# Patient Record
Sex: Male | Born: 1940
Health system: Southern US, Community
[De-identification: ages and names within clinical notes are randomized; demographics above are authoritative.]

## PROBLEM LIST (undated history)

## (undated) DIAGNOSIS — I779 Disorder of arteries and arterioles, unspecified: Secondary | ICD-10-CM

## (undated) DIAGNOSIS — E785 Hyperlipidemia, unspecified: Secondary | ICD-10-CM

## (undated) DIAGNOSIS — K219 Gastro-esophageal reflux disease without esophagitis: Secondary | ICD-10-CM

## (undated) DIAGNOSIS — H109 Unspecified conjunctivitis: Secondary | ICD-10-CM

## (undated) DIAGNOSIS — Z9889 Other specified postprocedural states: Secondary | ICD-10-CM

## (undated) DIAGNOSIS — I4892 Unspecified atrial flutter: Secondary | ICD-10-CM

## (undated) DIAGNOSIS — F419 Anxiety disorder, unspecified: Secondary | ICD-10-CM

## (undated) DIAGNOSIS — G4730 Sleep apnea, unspecified: Secondary | ICD-10-CM

## (undated) DIAGNOSIS — F329 Major depressive disorder, single episode, unspecified: Secondary | ICD-10-CM

## (undated) DIAGNOSIS — M1710 Unilateral primary osteoarthritis, unspecified knee: Secondary | ICD-10-CM

## (undated) DIAGNOSIS — F988 Other specified behavioral and emotional disorders with onset usually occurring in childhood and adolescence: Secondary | ICD-10-CM

## (undated) DIAGNOSIS — H269 Unspecified cataract: Secondary | ICD-10-CM

## (undated) DIAGNOSIS — R011 Cardiac murmur, unspecified: Secondary | ICD-10-CM

## (undated) DIAGNOSIS — J45909 Unspecified asthma, uncomplicated: Secondary | ICD-10-CM

## (undated) DIAGNOSIS — F32A Depression, unspecified: Secondary | ICD-10-CM

## (undated) DIAGNOSIS — I499 Cardiac arrhythmia, unspecified: Secondary | ICD-10-CM

## (undated) DIAGNOSIS — M179 Osteoarthritis of knee, unspecified: Secondary | ICD-10-CM

## (undated) HISTORY — DX: Sleep apnea, unspecified: G47.30

## (undated) HISTORY — DX: Disorder of arteries and arterioles, unspecified: I77.9

## (undated) HISTORY — PX: COLONOSCOPY: SHX174

## (undated) HISTORY — PX: TONSILLECTOMY: SUR1361

## (undated) HISTORY — DX: Unilateral primary osteoarthritis, unspecified knee: M17.10

## (undated) HISTORY — DX: Cardiac murmur, unspecified: R01.1

## (undated) HISTORY — DX: Hyperlipidemia, unspecified: E78.5

## (undated) HISTORY — DX: Unspecified atrial flutter: I48.92

## (undated) HISTORY — DX: Unspecified cataract: H26.9

## (undated) HISTORY — PX: MASTOIDECTOMY: SHX711

## (undated) HISTORY — DX: Gastro-esophageal reflux disease without esophagitis: K21.9

## (undated) HISTORY — DX: Depression, unspecified: F32.A

## (undated) HISTORY — DX: Unspecified conjunctivitis: H10.9

## (undated) HISTORY — DX: Osteoarthritis of knee, unspecified: M17.9

## (undated) HISTORY — DX: Other specified postprocedural states: Z98.890

## (undated) HISTORY — DX: Anxiety disorder, unspecified: F41.9

## (undated) HISTORY — DX: Other specified behavioral and emotional disorders with onset usually occurring in childhood and adolescence: F98.8

## (undated) HISTORY — PX: POLYPECTOMY: SHX149

## (undated) HISTORY — DX: Unspecified asthma, uncomplicated: J45.909

## (undated) HISTORY — DX: Major depressive disorder, single episode, unspecified: F32.9

---

## 2000-11-03 ENCOUNTER — Ambulatory Visit (HOSPITAL_BASED_OUTPATIENT_CLINIC_OR_DEPARTMENT_OTHER): Admission: RE | Admit: 2000-11-03 | Discharge: 2000-11-03 | Payer: Self-pay | Admitting: Psychiatry

## 2002-10-08 ENCOUNTER — Ambulatory Visit (HOSPITAL_BASED_OUTPATIENT_CLINIC_OR_DEPARTMENT_OTHER): Admission: RE | Admit: 2002-10-08 | Discharge: 2002-10-08 | Payer: Self-pay | Admitting: Internal Medicine

## 2003-03-09 ENCOUNTER — Encounter: Payer: Self-pay | Admitting: Orthopedic Surgery

## 2003-03-09 ENCOUNTER — Inpatient Hospital Stay (HOSPITAL_COMMUNITY): Admission: RE | Admit: 2003-03-09 | Discharge: 2003-03-10 | Payer: Self-pay | Admitting: Orthopedic Surgery

## 2004-03-12 ENCOUNTER — Ambulatory Visit: Payer: Self-pay | Admitting: Internal Medicine

## 2005-02-15 HISTORY — PX: WRIST FRACTURE SURGERY: SHX121

## 2005-07-30 ENCOUNTER — Ambulatory Visit: Payer: Self-pay | Admitting: Internal Medicine

## 2005-09-10 ENCOUNTER — Ambulatory Visit: Payer: Self-pay | Admitting: Internal Medicine

## 2006-03-30 ENCOUNTER — Ambulatory Visit: Payer: Self-pay | Admitting: Internal Medicine

## 2007-03-22 DIAGNOSIS — E785 Hyperlipidemia, unspecified: Secondary | ICD-10-CM

## 2007-03-22 HISTORY — DX: Hyperlipidemia, unspecified: E78.5

## 2007-03-23 ENCOUNTER — Ambulatory Visit: Payer: Self-pay | Admitting: Internal Medicine

## 2007-03-23 DIAGNOSIS — R5383 Other fatigue: Secondary | ICD-10-CM

## 2007-03-23 DIAGNOSIS — N400 Enlarged prostate without lower urinary tract symptoms: Secondary | ICD-10-CM | POA: Insufficient documentation

## 2007-03-23 DIAGNOSIS — R05 Cough: Secondary | ICD-10-CM

## 2007-03-23 DIAGNOSIS — N401 Enlarged prostate with lower urinary tract symptoms: Secondary | ICD-10-CM | POA: Insufficient documentation

## 2007-03-23 DIAGNOSIS — N138 Other obstructive and reflux uropathy: Secondary | ICD-10-CM | POA: Insufficient documentation

## 2007-03-23 DIAGNOSIS — R059 Cough, unspecified: Secondary | ICD-10-CM | POA: Insufficient documentation

## 2007-03-23 DIAGNOSIS — R5381 Other malaise: Secondary | ICD-10-CM

## 2007-03-23 HISTORY — DX: Other fatigue: R53.83

## 2007-03-23 HISTORY — DX: Other malaise: R53.81

## 2007-03-23 LAB — CONVERTED CEMR LAB
ALT: 21 units/L (ref 0–53)
Albumin: 4.1 g/dL (ref 3.5–5.2)
Alkaline Phosphatase: 66 units/L (ref 39–117)
BUN: 13 mg/dL (ref 6–23)
Bacteria, UA: NEGATIVE
Basophils Relative: 0.5 % (ref 0.0–1.0)
Bilirubin Urine: NEGATIVE
CO2: 31 meq/L (ref 19–32)
Calcium: 9.5 mg/dL (ref 8.4–10.5)
Crystals: NEGATIVE
GFR calc Af Amer: 78 mL/min
Ketones, ur: NEGATIVE mg/dL
Monocytes Relative: 10.7 % (ref 3.0–11.0)
Platelets: 329 10*3/uL (ref 150–400)
RDW: 12.4 % (ref 11.5–14.6)
Specific Gravity, Urine: 1.025 (ref 1.000–1.03)
Total Protein, Urine: NEGATIVE mg/dL
Total Protein: 7 g/dL (ref 6.0–8.3)
Urine Glucose: NEGATIVE mg/dL
pH: 6 (ref 5.0–8.0)

## 2007-06-12 ENCOUNTER — Ambulatory Visit: Payer: Self-pay | Admitting: Pulmonary Disease

## 2007-06-12 ENCOUNTER — Telehealth (INDEPENDENT_AMBULATORY_CARE_PROVIDER_SITE_OTHER): Payer: Self-pay | Admitting: *Deleted

## 2007-06-12 DIAGNOSIS — J309 Allergic rhinitis, unspecified: Secondary | ICD-10-CM | POA: Insufficient documentation

## 2008-01-08 ENCOUNTER — Telehealth (INDEPENDENT_AMBULATORY_CARE_PROVIDER_SITE_OTHER): Payer: Self-pay | Admitting: *Deleted

## 2008-01-24 ENCOUNTER — Encounter: Payer: Self-pay | Admitting: Adult Health

## 2008-01-24 ENCOUNTER — Ambulatory Visit: Payer: Self-pay | Admitting: Internal Medicine

## 2008-01-24 DIAGNOSIS — R42 Dizziness and giddiness: Secondary | ICD-10-CM

## 2008-01-24 HISTORY — DX: Dizziness and giddiness: R42

## 2008-01-30 LAB — CONVERTED CEMR LAB
ALT: 27 units/L (ref 0–53)
AST: 28 units/L (ref 0–37)
BUN: 13 mg/dL (ref 6–23)
Basophils Relative: 0.3 % (ref 0.0–3.0)
CO2: 32 meq/L (ref 19–32)
Calcium: 9.7 mg/dL (ref 8.4–10.5)
Chloride: 106 meq/L (ref 96–112)
Creatinine, Ser: 1.1 mg/dL (ref 0.4–1.5)
Eosinophils Absolute: 0.2 10*3/uL (ref 0.0–0.7)
Eosinophils Relative: 3.8 % (ref 0.0–5.0)
Glucose, Bld: 105 mg/dL — ABNORMAL HIGH (ref 70–99)
HCT: 37.5 % — ABNORMAL LOW (ref 39.0–52.0)
Hemoglobin: 12.5 g/dL — ABNORMAL LOW (ref 13.0–17.0)
MCV: 93.2 fL (ref 78.0–100.0)
Monocytes Absolute: 0.4 10*3/uL (ref 0.1–1.0)
Monocytes Relative: 9.3 % (ref 3.0–12.0)
Neutro Abs: 2.5 10*3/uL (ref 1.4–7.7)
Platelets: 305 10*3/uL (ref 150–400)
RBC: 4.03 M/uL — ABNORMAL LOW (ref 4.22–5.81)
Sed Rate: 20 mm/hr — ABNORMAL HIGH (ref 0–16)
TSH: 1.8 microintl units/mL (ref 0.35–5.50)
Total Protein: 7 g/dL (ref 6.0–8.3)
WBC: 4.6 10*3/uL (ref 4.5–10.5)

## 2008-01-31 ENCOUNTER — Telehealth (INDEPENDENT_AMBULATORY_CARE_PROVIDER_SITE_OTHER): Payer: Self-pay | Admitting: *Deleted

## 2008-01-31 DIAGNOSIS — H9190 Unspecified hearing loss, unspecified ear: Secondary | ICD-10-CM | POA: Insufficient documentation

## 2008-02-06 ENCOUNTER — Telehealth: Payer: Self-pay | Admitting: Adult Health

## 2008-02-07 ENCOUNTER — Ambulatory Visit: Payer: Self-pay | Admitting: Internal Medicine

## 2008-02-07 DIAGNOSIS — E559 Vitamin D deficiency, unspecified: Secondary | ICD-10-CM | POA: Insufficient documentation

## 2008-02-28 ENCOUNTER — Encounter: Payer: Self-pay | Admitting: Internal Medicine

## 2008-04-15 ENCOUNTER — Ambulatory Visit: Payer: Self-pay | Admitting: Internal Medicine

## 2008-04-15 ENCOUNTER — Telehealth (INDEPENDENT_AMBULATORY_CARE_PROVIDER_SITE_OTHER): Payer: Self-pay | Admitting: *Deleted

## 2008-04-15 LAB — CONVERTED CEMR LAB
ALT: 19 units/L (ref 0–53)
AST: 20 units/L (ref 0–37)
Alkaline Phosphatase: 77 units/L (ref 39–117)
Basophils Absolute: 0 10*3/uL (ref 0.0–0.1)
Basophils Relative: 0.6 % (ref 0.0–3.0)
Bilirubin, Direct: 0.1 mg/dL (ref 0.0–0.3)
CO2: 31 meq/L (ref 19–32)
Calcium: 9 mg/dL (ref 8.4–10.5)
Chloride: 107 meq/L (ref 96–112)
Cholesterol: 137 mg/dL (ref 0–200)
Glucose, Bld: 106 mg/dL — ABNORMAL HIGH (ref 70–99)
Hemoglobin: 12 g/dL — ABNORMAL LOW (ref 13.0–17.0)
LDL Cholesterol: 73 mg/dL (ref 0–99)
Lymphocytes Relative: 13.8 % (ref 12.0–46.0)
MCHC: 34 g/dL (ref 30.0–36.0)
Monocytes Relative: 8.3 % (ref 3.0–12.0)
Neutro Abs: 5.9 10*3/uL (ref 1.4–7.7)
Neutrophils Relative %: 75.8 % (ref 43.0–77.0)
RBC: 3.76 M/uL — ABNORMAL LOW (ref 4.22–5.81)
RDW: 11.6 % (ref 11.5–14.6)
Sodium: 144 meq/L (ref 135–145)
Total Bilirubin: 0.7 mg/dL (ref 0.3–1.2)
Total CHOL/HDL Ratio: 3.6
Total Protein: 7.1 g/dL (ref 6.0–8.3)
VLDL: 26 mg/dL (ref 0–40)

## 2009-06-10 ENCOUNTER — Telehealth (INDEPENDENT_AMBULATORY_CARE_PROVIDER_SITE_OTHER): Payer: Self-pay | Admitting: *Deleted

## 2009-06-25 ENCOUNTER — Ambulatory Visit: Payer: Self-pay | Admitting: Internal Medicine

## 2009-07-08 ENCOUNTER — Encounter: Payer: Self-pay | Admitting: Internal Medicine

## 2009-08-06 ENCOUNTER — Telehealth: Payer: Self-pay | Admitting: Internal Medicine

## 2009-09-15 ENCOUNTER — Ambulatory Visit: Payer: Self-pay | Admitting: Internal Medicine

## 2009-09-15 DIAGNOSIS — E291 Testicular hypofunction: Secondary | ICD-10-CM

## 2009-09-15 HISTORY — DX: Testicular hypofunction: E29.1

## 2009-09-15 LAB — CONVERTED CEMR LAB
ALT: 17 units/L (ref 0–53)
AST: 20 units/L (ref 0–37)
Albumin: 4.4 g/dL (ref 3.5–5.2)
Alkaline Phosphatase: 71 units/L (ref 39–117)
Basophils Absolute: 0 10*3/uL (ref 0.0–0.1)
Bilirubin Urine: NEGATIVE
Calcium: 9.2 mg/dL (ref 8.4–10.5)
Eosinophils Relative: 3.9 % (ref 0.0–5.0)
GFR calc non Af Amer: 70.63 mL/min (ref >60)
Glucose, Bld: 97 mg/dL (ref 70–99)
HCT: 35.3 % — ABNORMAL LOW (ref 39.0–52.0)
HDL: 56.7 mg/dL (ref >39.00)
Hemoglobin: 12.1 g/dL — ABNORMAL LOW (ref 13.0–17.0)
LDL Cholesterol: 87 mg/dL (ref 0–99)
Lymphocytes Relative: 31.1 % (ref 12.0–46.0)
Monocytes Relative: 8.8 % (ref 3.0–12.0)
Neutro Abs: 2.6 10*3/uL (ref 1.4–7.7)
Nitrite: NEGATIVE
Potassium: 4.6 meq/L (ref 3.5–5.1)
RBC: 3.74 M/uL — ABNORMAL LOW (ref 4.22–5.81)
RDW: 13.1 % (ref 11.5–14.6)
Sodium: 142 meq/L (ref 135–145)
Total CHOL/HDL Ratio: 3
Total Protein, Urine: NEGATIVE mg/dL
Urine Glucose: NEGATIVE mg/dL
VLDL: 19 mg/dL (ref 0.0–40.0)
WBC: 4.7 10*3/uL (ref 4.5–10.5)
pH: 6.5 (ref 5.0–8.0)

## 2009-09-16 ENCOUNTER — Telehealth (INDEPENDENT_AMBULATORY_CARE_PROVIDER_SITE_OTHER): Payer: Self-pay | Admitting: *Deleted

## 2009-09-24 ENCOUNTER — Telehealth: Payer: Self-pay | Admitting: Internal Medicine

## 2009-10-01 ENCOUNTER — Encounter: Payer: Self-pay | Admitting: Internal Medicine

## 2009-10-23 ENCOUNTER — Encounter: Payer: Self-pay | Admitting: Internal Medicine

## 2010-03-17 NOTE — Progress Notes (Signed)
Summary: lab questions - LMTCBx1  Phone Note Call from Patient Call back at [phone]   Caller: Spouse//kate Call For: wert Reason for Call: Talk to Nurse Summary of Call: Has questions about her husband's testosterone report. Initial call taken by: Darletta Moll,  September 16, 2009 4:32 PM  Follow-up for Phone Call        per pt's chart, results recorded in phone tree.  ATC phone tree, but was told that the results are unavailable.  LMOM TCB for pt's wife to find out what questions she has. Boone Master CNA/MA  September 16, 2009 4:42 PM   Pt's wife states pt is "really ADD and [place] he spoke with Dr. Sherene Sires.  The only thing he got out of their converstation was he has low testosterone and some of the medications would cause cancer."  Jae Dire, pt's wife, would like clarrifaction on what Dr. Sherene Sires discussed with Mr. Brockel.  Requesting call back at above number.  Will forward message to Dr. Sherene Sires to address.  Pls advise.  Thanks! Follow-up by: Gweneth Dimitri RN,  September 17, 2009 9:42 AM  Additional Follow-up for Phone Call Additional follow up Details #1::        the decision to treat with testosterone or not needs to be done in concert with stepped up screening for prostate ca and is best done by urology so I want him to see one doctor (urology) for both issues Additional Follow-up by: Nyoka Cowden MD,  September 17, 2009 1:25 PM    Additional Follow-up for Phone Call Additional follow up Details #2::    called and spoke with pt's wife and informed her of MW's response.  wife verbalized understanding.  Aundra Millet Reynolds LPN  September 17, 2009 3:42 PM

## 2010-03-17 NOTE — Consult Note (Signed)
[facility]   Imported By: Sherian Rein 10/21/2009 08:10:35  _____________________________________________________________________  External Attachment:    Type:   Image     Comment:   External Document

## 2010-03-17 NOTE — Progress Notes (Signed)
Summary: NOTES/ REFERRAL  Phone Note Call from Patient   Caller: Spouse Call For: WERT Summary of Call: SPOUSE SAYS DR MARK OTTELIN'S OFFICE IS WAITING TO RECEIVE NOTES (OV) RE: REFERRAL AS PT HAS APPT PENDING SOON. FAX # [phone]. MRS. [place] # [id number] Initial call taken by: Tivis Ringer, CNA,  September 24, 2009 10:17 AM  Follow-up for Phone Call        [facility] faxed to Dr. Vernie Ammons and spouse informed. Zackery Barefoot CMA  September 24, 2009 11:07 AM

## 2010-03-17 NOTE — Progress Notes (Signed)
Summary: refill on Simvastatin   Phone Note Call from Patient Call back at Home Phone [phone]   Caller: Patient Call For: wert Reason for Call: Refill Medication Summary of Call: patient needs a refill on simvastatin, they have been denied because there was no apt made. he has now made an apt on 5/13. can he get some to last till then.   [facility] [address] Initial call taken by: Valinda Hoar,  June 10, 2009 12:16 PM  Follow-up for Phone Call        per EMR, Verlon Au ok'd refills on Simvastatin x 10.  called and spoke with pt's wife and informed her of this.  wife verbalized understanding and will relay message to pt.  Arman Filter LPN  June 10, 2009 12:35 PM

## 2010-03-17 NOTE — Assessment & Plan Note (Signed)
Summary: Primary svc/ f/u hyperlipidemia   Primary Provider/Referring Provider:  Sherene Sires  CC:  Followup.  Pt denies any complaints today.[name] Browning  History of Present Illness: 70  yowm  with  history of hyperlipidemia, allergic rhinitis , and depression.   2/09--presented for CPX-hx of an abnormal EKG leading to a left heart catheter in 1994 that was felt to be completely normal and therefore felt to represent a false positive EKG,  still able to exercise up to 5 times weekly including intense cross-country running.      Jun 25, 2009 cc need refill ofor simvastatin, no cp tia or claudicaiton.  Pt denies any significant sore throat, dysphagia, itching, sneezing,  nasal congestion or excess secretions,  fever, chills, sweats, unintended wt loss, pleuritic or exertional cp, hempoptysis, change in activity tolerance  orthopnea pnd or leg swelling   Current Medications (verified): 1)  Adult Aspirin Low Strength 81 Mg  Tbdp (Aspirin) .... Once Daily 2)  Multivitamins   Tabs (Multiple Vitamin) .... Once Daily 3)  Pristiq 100 Mg Xr24h-Tab (Desvenlafaxine Succinate) .[name] Browning.. 1 Once Daily 4)  Clonazepam 0.5 Mg  Tabs (Clonazepam) .... Two Times A Day 5)  Simvastatin 40 Mg  Tabs (Simvastatin) .... Once Daily 6)  Fish Oil Concentrate 1000 Mg  Caps (Omega-3 Fatty Acids) .... Two Times A Day 7)  Trazodone Hcl 50 Mg Tabs (Trazodone Hcl) .[name] Browning.. 1 At Bedtime 8)  Methylphenidate Hcl 20 Mg Tabs (Methylphenidate Hcl) .[name] Browning.. 1 Two Times A Day 9)  Oscal 500/200 D-3 500-200 Mg-Unit Tabs (Calcium-Vitamin D) .[name] Browning.. 1 Two Times A Day 10)  Abilify 2 Mg Tabs (Aripiprazole) .[name] Browning.. 1 At Bedtime  Allergies (verified): No Known Drug Allergies  Past History:  Past Medical History: Adult ADD........................................................................................[name] KitchenKaur Hyperlipidemia      - Target LDL < 130 male, type A, pos fm hx hemorrhoidal  bleeding.....................................................................[name] KitchenPatterson    - colonoscopy 05/22/2001 sleep apnea seat CPAP titration to 10 recommended 10/08/2002 for a baseline RDI of 33     - stopped 2007 Health Maintenance...........................................................................[name] KitchenWert     - DT 2/03     - Pneumovax April 15, 2008      - CPX April 15, 2008   Family History: prostate cancer in his father at age 68  stroke in father diabetes in a brother (ONLY sibling, 3 years older)  Ischemic heart disease mother at age 11  Leukemia his mother  Negative for colon cancer  Social History: Quit age 64 Denies ethoh but a problem in the distant past previous avid runner/ retired Clinical research associate for [employer]  Vital Signs:  Patient profile:   70 year old male Height:      67 inches Weight:      157 pounds BMI:     24.68 O2 Sat:      97 % on Room air Temp:     97.9 degrees F oral Pulse rate:   70 / minute BP sitting:   138 / 84  (left arm)  Vitals Entered By: Vernie Murders (Jun 25, 2009 3:29 PM)  O2 Flow:  Room air  Physical Exam  Additional Exam:  GEN: A/Ox3; pleasant , NAD, slightly anxious   wt 150 > 153 February 07, 2008 > 153 April 15, 2008 > 157 Jun 25, 2009  HEENT:  [place]/AT, , Armed forces training and education officer, TMs-wnl, NOSE-clear, THROAT-clear NECK:  Supple w/ fair ROM; no JVD; normal carotid impulses w/o bruits; no thyromegaly or nodules palpated; no lymphadenopathy. CHEST: minimal insp pops and sqeaks,  scattered bilteral end exp sonorous rhonchi HEART:  RRR, no m/r/g   ABDOMEN:  Soft & nt; nml bowel sounds; no organomegaly or masses detected. EXT: Warm bil,  no calf pain, edema, clubbing, pulses intact    Impression & Recommendations:  Problem # 1:  HYPERLIPIDEMIA (ICD-272.4)  - Target LDL < 130 male, type A, pos fm hx His updated medication list for this problem includes:    Simvastatin 40 Mg Tabs (Simvastatin) ..... Once daily  Orders: Prescription  Created Electronically [phone]) Est. Patient Level III [phone])  Labs Reviewed: SGOT: 20 (04/15/2008)   SGPT: 19 (04/15/2008)   HDL:37.7 (04/15/2008)  LDL:73 (04/15/2008)  Chol:137 (04/15/2008)  Trig:132 (04/15/2008)  Each maintenance medication was reviewed in detail including most importantly the difference between maintenance prns and under what circumstances the prns are to be used.  In addition, these two groups (for which the patient should keep up with refills) were distinguished from a third group :  meds that are used only short term with the intent to complete a course of therapy and then not refill them.  The med list was then fully reconciled and reorganized to reflect this important distinction.  CPX due now  Medications Added to Medication List This Visit: 1)  Trazodone Hcl 50 Mg Tabs (Trazodone hcl) .[name] Browning.. 1 at bedtime 2)  Methylphenidate Hcl 20 Mg Tabs (Methylphenidate hcl) .[name] Browning.. 1 two times a day 3)  Abilify 2 Mg Tabs (Aripiprazole) .[name] Browning.. 1 at bedtime  Patient Instructions: 1)  Return to office w/ in 3 months for CPX

## 2010-03-17 NOTE — Progress Notes (Signed)
Summary: physcial/ labs  Phone Note Call from Patient Call back at Home Phone [phone]   Caller: Spouse Call For: Josaiah Muhammed Summary of Call: pt has a physical scheduled for 8/1. spouse wants labs put in at the time of appt to have testosterone levels checked. no call back needed.  Initial call taken by: Tivis Ringer, CNA,  August 06, 2009 3:10 PM  Follow-up for Phone Call        called and spoke with pts wife---she is aware appt is at 9am--so pt will come infasting for labs the same day as appt. Randell Loop CMA  August 06, 2009 3:18 PM

## 2010-03-17 NOTE — Letter (Signed)
Summary: [facility]  [facility]   Imported By: Sherian Rein 07/30/2009 09:15:29  _____________________________________________________________________  External Attachment:    Type:   Image     Comment:   External Document

## 2010-03-17 NOTE — Assessment & Plan Note (Signed)
Summary: Primary svc/ cpx > refer to Urology for ? testosterone def   Primary Provider/Referring Provider:  Sherene Sires  CC:  cpx fasting.  History of Present Illness: 70  yowm  with  history of hyperlipidemia, allergic rhinitis , and depression.   2/09--presented for CPX-hx of an abnormal EKG leading to a left heart catheter in 1994 that was felt to be completely normal and therefore felt to represent a false positive EKG,  still able to exercise up to 5 times weekly including intense cross-country running.      Jun 25, 2009 cc need refill ofor simvastatin, no cp tia or claudicaiton.  no change  September 15, 2009 cpx no c/o's. Pt denies any significant sore throat, dysphagia, itching, sneezing,  nasal congestion or excess secretions,  fever, chills, sweats, unintended wt loss, pleuritic or exertional cp, hempoptysis, change in activity tolerance  orthopnea pnd or leg swelling   Current Medications (verified): 1)  Adult Aspirin Low Strength 81 Mg  Tbdp (Aspirin) .... Once Daily 2)  Multivitamins   Tabs (Multiple Vitamin) .... Once Daily 3)  Pristiq 100 Mg Xr24h-Tab (Desvenlafaxine Succinate) .[name].. 1 Once Daily 4)  Clonazepam 0.5 Mg  Tabs (Clonazepam) .... Two Times A Day 5)  Simvastatin 40 Mg  Tabs (Simvastatin) .... Once Daily 6)  Fish Oil Concentrate 1000 Mg  Caps (Omega-3 Fatty Acids) .... Two Times A Day 7)  Trazodone Hcl 50 Mg Tabs (Trazodone Hcl) .[name].. 1 At Bedtime 8)  Methylphenidate Hcl 20 Mg Tabs (Methylphenidate Hcl) .[name].. 1 Two Times A Day 9)  Abilify 2 Mg Tabs (Aripiprazole) .[name].. 1 At Bedtime  Allergies (verified): No Known Drug Allergies  Past History:  Past Medical History: Adult ADD........................................................................................Marland KitchenKaur Hyperlipidemia      - Target LDL < 130 male, type A, pos fm hx hemorrhoidal bleeding.....................................................................Marland KitchenPatterson    - colonoscopy 05/22/2001 sleep apnea  seat CPAP titration to 10 recommended 10/08/2002 for a baseline RDI of 33     - stopped 2007 Health Maintenance...........................................................................Marland KitchenWert     - DT 2/03     - Pneumovax April 15, 2008      - CPX September 15, 2009  DJD Left Knee....................................................................................Marland KitchenGioffree  Vital Signs:  Patient profile:   70 year old male Weight:      147.50 pounds O2 Sat:      97 % on Room air Temp:     98.2 degrees F oral Pulse rate:   68 / minute BP sitting:   120 / 70  (left arm)  Vitals Entered By: Vernie Murders (September 15, 2009 8:49 AM)  O2 Flow:  Room air  Physical Exam  Additional Exam:  GEN: A/Ox3; pleasant , NAD, slightly anxious   wt 150 > 153 February 07, 2008 > 153 April 15, 2008 > 157 Jun 25, 2009 > 147 September 15, 2009  HEENT:  [place]/AT, , Armed forces training and education officer, TMs-wnl, NOSE-clear, THROAT-clear NECK:  Supple w/ fair ROM; no JVD; normal carotid impulses w/o bruits; no thyromegaly or nodules palpated; no lymphadenopathy. CHEST clear to a and P bilaterally HEART:  RRR, no m/r/g   ABDOMEN:  Soft & nt; nml bowel sounds; no organomegaly or masses detected. EXT: Warm bil,  no calf pain, edema, clubbing, pulses intact GU no nodules, no IH Rectal  mild bph, stool g neg MS mild creptiance both knees, no effusions, nl rom, nl gait    Cholesterol  163 mg/dL                   1-610     ATP III Classification            Desirable:  < 200 mg/dL                    Borderline High:  200 - 239 mg/dL               High:  > = 240 mg/dL   Triglycerides             95.0 mg/dL                  9.6-045.4     Normal:  <150 mg/dL     Borderline High:  098 - 199 mg/dL   HDL                       11.91 mg/dL                 >47.82   VLDL Cholesterol          19.0 mg/dL                  9.5-62.1   LDL Cholesterol           87 mg/dL                    3-08  CHO/HDL Ratio:  CHD Risk                              3                    Men          Women     1/2 Average Risk     3.4          3.3     Average Risk          5.0          4.4     2X Average Risk          9.6          7.1     3X Average Risk          15.0          11.0                           Tests: (2) BMP (METABOL)   Sodium                    142 mEq/L                   135-145   Potassium                 4.6 mEq/L                   3.5-5.1   Chloride                  105 mEq/L                   96-112   Carbon Dioxide            31 mEq/L  19-32   Glucose                   97 mg/dL                    24-40   BUN                       14 mg/dL                    1-02   Creatinine                1.1 mg/dL                   7.2-5.3   Calcium                   9.2 mg/dL                   6.6-44.0   GFR                       70.63 mL/min                >60  Tests: (3) CBC Platelet w/Diff (CBCD)   White Cell Count          4.7 K/uL                    4.5-10.5   Red Cell Count       [L]  3.74 Mil/uL                 4.22-5.81   Hemoglobin           [L]  12.1 g/dL                   34.7-42.5   Hematocrit           [L]  35.3 %                      39.0-52.0   MCV                       94.3 fl                     78.0-100.0   MCHC                      34.3 g/dL                   95.6-38.7   RDW                       13.1 %                      11.5-14.6   Platelet Count            281.0 K/uL                  150.0-400.0   Neutrophil %              55.9 %                      43.0-77.0   Lymphocyte %  31.1 %                      12.0-46.0   Monocyte %                8.8 %                       3.0-12.0   Eosinophils%              3.9 %                       0.0-5.0   Basophils %               0.3 %                       0.0-3.0   Neutrophill Absolute      2.6 K/uL                    1.4-7.7   Lymphocyte Absolute       1.4 K/uL                    0.7-4.0   Monocyte Absolute         0.4 K/uL                     0.1-1.0  Eosinophils, Absolute                             0.2 K/uL                    0.0-0.7   Basophils Absolute        0.0 K/uL                    0.0-0.1  Tests: (4) Hepatic/Liver Function Panel (HEPATIC)   Total Bilirubin           1.0 mg/dL                   5.7-3.2   Direct Bilirubin          0.1 mg/dL                   2.0-2.5   Alkaline Phosphatase      71 U/L                      39-117   AST                       20 U/L                      0-37   ALT                       17 U/L                      0-53   Total Protein             6.9 g/dL                    4.2-7.0   Albumin  4.4 g/dL                    1.6-1.0  Tests: (5) TSH (TSH)   FastTSH                   1.71 uIU/mL                 0.35-5.50  Tests: (6) B-Type Natiuretic Peptide (BNPR)  B-Type Natriuetic Peptide                             59.6 pg/mL                  0.0-100.0  Tests: (7) Testosterone, Total (TESTO)   Testosterone         [L]  249.71 ng/dL                960.45-409.81  Tests: (8) UDip Only (UDIP)   Color                     LT. YELLOW       RANGE:  Yellow;Lt. Yellow   Clarity                   CLEAR                       Clear   Specific Gravity          1.015                       1.000 - 1.030   Urine Ph                  6.5                         5.0-8.0   Protein                   NEGATIVE                    Negative   Urine Glucose             NEGATIVE                    Negative   Ketones                   TRACE                       Negative   Urine Bilirubin           NEGATIVE                    Negative   Blood                     NEGATIVE                    Negative   Urobilinogen              0.2                         0.0 - 1.0   Leukocyte Esterace        NEGATIVE  Negative   Nitrite                   NEGATIVE                    Negative  Impression & Recommendations:  Problem # 1:  HYPERLIPIDEMIA (ICD-272.4)  no simvastatin  side  effects to date. Target LDL < 130 male, type A, pos fm hx  His updated medication list for this problem includes:    Simvastatin 40 Mg Tabs (Simvastatin) ..... Once daily  Labs Reviewed: SGOT: 20 (04/15/2008)   SGPT: 19 (04/15/2008)   HDL:37.7 (04/15/2008)  LDL:73 (04/15/2008)  >  September 15, 2009 = 87 so no change in rx indicated  Chol:137 (04/15/2008)  Trig:132 (04/15/2008)  Problem # 2:  FATIGUE, ACUTE (ICD-780.79)  Orders: TLB-Testosterone, Total (84403-TESTO)  needs urology eval for ? replacement   Other Orders: EKG w/ Interpretation (93000) TLB-Lipid Panel (80061-LIPID) TLB-BMP (Basic Metabolic Panel-BMET) (80048-METABOL) TLB-CBC Platelet - w/Differential (85025-CBCD) TLB-Hepatic/Liver Function Pnl (80076-HEPATIC) TLB-TSH (Thyroid Stimulating Hormone) (84443-TSH) TLB-BNP (B-Natriuretic Peptide) (83880-BNPR) TLB-Udip ONLY (81003-UDIP) Est. Patient 65& > (16109) Urology Referral (Urology)  Patient Instructions: 1)  Call [phone] for your results w/in next 3 days - if there's something important  I feel you need to know,  I'll be in touch with you directly.  2)  Return to office in 6  months, sooner if needed  3)  LATE ADD: disucussed with pt, refer to urology for testosterone levels low, did not do PSA this year here so leave this to urology also    CardioPerfect ECG  ID: [id number] Patient: [name] Browning, [name] Browning DOB: Apr 20, 1940 Age: 70 Years Old Sex: Male Race: White Height: 67 Weight: 147.50 Status: Unconfirmed Past Medical History:  Adult ADD........................................................................................Marland KitchenKaur Hyperlipidemia      - Target LDL < 130 male, type A, pos fm hx hemorrhoidal bleeding.....................................................................Marland KitchenPatterson    - colonoscopy 05/22/2001 sleep apnea seat CPAP titration to 10 recommended 10/08/2002 for a baseline RDI of 33     - stopped 2007 Health  Maintenance...........................................................................Marland KitchenWert     - DT 2/03     - Pneumovax April 15, 2008      - CPX April 15, 2008  Recorded: 09/15/2009 08:58 AM P/PR: 143 ms / 198 ms - Heart rate (maximum exercise) QRS: 84 QT/QTc/QTd: 399 ms / 397 ms / 41 ms - Heart rate (maximum exercise)  P/QRS/T axis: 67 deg / 80 deg / 12 deg - Heart rate (maximum exercise)  Heartrate: 59 bpm  Interpretation:   sinus rhythm (slow)  intra-atrial conduction delay   Borderline ECG

## 2010-04-06 ENCOUNTER — Encounter: Payer: Self-pay | Admitting: Internal Medicine

## 2010-04-23 NOTE — Letter (Signed)
[facility]: [facility]   Imported By: Sherian Rein 04/15/2010 09:36:41  _____________________________________________________________________  External Attachment:    Type:   Image     Comment:   External Document

## 2010-04-30 ENCOUNTER — Encounter: Payer: Self-pay | Admitting: Internal Medicine

## 2010-04-30 ENCOUNTER — Ambulatory Visit (INDEPENDENT_AMBULATORY_CARE_PROVIDER_SITE_OTHER): Payer: Medicare Other | Admitting: Internal Medicine

## 2010-04-30 DIAGNOSIS — E785 Hyperlipidemia, unspecified: Secondary | ICD-10-CM

## 2010-04-30 DIAGNOSIS — G479 Sleep disorder, unspecified: Secondary | ICD-10-CM | POA: Insufficient documentation

## 2010-04-30 HISTORY — DX: Sleep disorder, unspecified: G47.9

## 2010-05-05 NOTE — Assessment & Plan Note (Signed)
Summary: Primary svc/ f/u hyperlipidemia   Primary Provider/Referring Provider:  Sherene Sires  CC:  Fatigue.  History of Present Illness: 70  yowm  with  history of hyperlipidemia, allergic rhinitis , and depression.   2/09--presented for CPX-hx of an abnormal EKG leading to a left heart catheter in 1994 that was felt to be completely normal and therefore felt to represent a false positive EKG,  still able to exercise up to 5 times weekly including intense cross-country running.      Jun 25, 2009 cc need refill ofor simvastatin, no cp tia or claudicaiton.  no change  September 15, 2009 cpx no c/o's   April 30, 2010 ov cc fatigue can only sleep with meds and stopped meds due to article in paper. Pt denies any significant sore throat, dysphagia, itching, sneezing,  nasal congestion or excess secretions,  fever, chills, sweats, unintended wt loss, pleuritic or exertional cp, hempoptysis, change in activity tolerance  orthopnea pnd or leg swelling.  Pt also denies any obvious fluctuation in symptoms with weather or environmental change or other alleviating or aggravating factors.        Current Medications (verified): 1)  Adult Aspirin Low Strength 81 Mg  Tbdp (Aspirin) .... Once Daily 2)  Multivitamins   Tabs (Multiple Vitamin) .... Once Daily 3)  Pristiq 100 Mg Xr24h-Tab (Desvenlafaxine Succinate) .[name].. 1 Once Daily 4)  Clonazepam 0.5 Mg  Tabs (Clonazepam) .... Two Times A Day 5)  Simvastatin 40 Mg  Tabs (Simvastatin) .... Once Daily 6)  Fish Oil Concentrate 1000 Mg  Caps (Omega-3 Fatty Acids) .... Two Times A Day 7)  Trazodone Hcl 50 Mg Tabs (Trazodone Hcl) .[name].. 1 At Bedtime 8)  Methylphenidate Hcl 20 Mg Tabs (Methylphenidate Hcl) .[name].. 1 Two Times A Day 9)  Abilify 2 Mg Tabs (Aripiprazole) .[name].. 1 At Bedtime 10)  Androgel 50 Mg/5gm Gel (Testosterone) .... Apply Once Daily As Directed  Allergies (verified): No Known Drug Allergies  Past History:  Past Medical History: Adult  ADD........................................................................................Marland KitchenKaur Hyperlipidemia      - Target LDL < 130 male, type A, pos fm hx hemorrhoidal bleeding.....................................................................Marland KitchenPatterson    - colonoscopy 05/22/2001 sleep apnea seat CPAP titration to 10 recommended 10/08/2002 for a baseline RDI of 33     - stopped 2007 Health Maintenance............................................................................Marland KitchenWert     - DT 03/2001     - Pneumovax April 15, 2008      - CPX September 15, 2009  DJD Left Knee....................................................................................Marland KitchenGioffree  Vital Signs:  Patient profile:   70 year old male Weight:      151 pounds BMI:     23.74 O2 Sat:      100 % on Room air Temp:     98.1 degrees F oral Pulse rate:   69 / minute BP sitting:   130 / 80  (left arm)  Vitals Entered By: Vernie Murders (April 30, 2010 3:54 PM)  O2 Flow:  Room air  Physical Exam  Additional Exam:  GEN: A/Ox3; pleasant , NAD, slightly anxious   wt  153 February 07, 2008 >  > 147 September 15, 2009 > 151 April 30, 2010  HEENT:  [place]/AT, , EACs-clear, TMs-wnl, NOSE-clear, THROAT-clear NECK:  Supple w/ fair ROM; no JVD; normal carotid impulses w/o bruits; no thyromegaly or nodules palpated; no lymphadenopathy. CHEST clear to a and P bilaterally HEART:  RRR, no m/r/g   ABDOMEN:  Soft & nt; nml bowel sounds; no organomegaly or masses detected. EXT:  Warm bil,  no calf pain, edema, clubbing, pulses intact    Impression & Recommendations:  Problem # 1:  HYPERLIPIDEMIA (ICD-272.4)  His updated medication list for this problem includes:    Simvastatin 40 Mg Tabs (Simvastatin) ..... Once daily  Labs Reviewed: SGOT: 20 (09/15/2009)   SGPT: 17 (09/15/2009)   HDL:56.70 (09/15/2009), 37.7 (04/15/2008)  LDL:87 (09/15/2009), 73 (04/15/2008)  Chol:163 (09/15/2009), 137 (04/15/2008)  Trig:95.0 (09/15/2009),  132 (04/15/2008)  Based on new guidelines no need for lft's  - no evidence mem loss, myalgias so no chnge rx  Orders: Est. Patient Level III (09604)  Problem # 2:  SLEEP DISORDER UNSPECIFIED (ICD-780.50)  Ok to use trazadone at hs though concerned about chronic clonazepm rx, defer to psyche  Orders: Est. Patient Level III (54098)  Medications Added to Medication List This Visit: 1)  Androgel 50 Mg/5gm Gel (Testosterone) .... Apply once daily as directed  Patient Instructions: 1)  Return for CPX after September 16 2010

## 2010-05-17 ENCOUNTER — Other Ambulatory Visit: Payer: Self-pay | Admitting: Internal Medicine

## 2010-07-03 NOTE — Assessment & Plan Note (Signed)
[facility]                             PULMONARY OFFICE NOTE   NAME:[name] Browning, [name] Browning                    MRN:          [id number]  DATE:03/30/2006                            DOB:          [birth year]    HISTORY:  A 70 year old white male with attention deficit disorder  followed by Dr. Jodi Marble and planning to switch over to Dr. ______, but  wanted to ask for me to refill the medicines while awaiting to  establish.  He is now retired but still very active working out  including jogging on an almost daily basis.  He denies any exertional  chest pain, orthopnea, PND, leg swelling.   For full inventory of medications please see face column dated March 30, 2006.   PHYSICAL EXAMINATION:  He is a very pleasant ambulatory white male, in  no acute distress.  VITAL SIGNS:  Vital Signs are stable.  HEENT:  Unremarkable, oropharynx clear.  LUNGS:  Fields are completely clear bilaterally to auscultation and  percussion.  Has a regular rate and rhythm without murmur, gallop, or rub.  ABDOMEN:  Soft and benign.  EXTREMITIES: Warm without calf tenderness, cyanosis, clubbing, or edema.   IMPRESSION:  1. Adult attention deficit disorder.  Well compensated on present      regimen, however I am going to defer to Dr. Milagros Evener regarding      refills on his medicines.  2. Hyperlipidemia with LDL at target by last study done in July 2007,      and therefore needs to return in July 2008 for a comprehensive      healthcare evaluation.  We will see him sooner if needed.     Charlaine Dalton. Sherene Sires, MD, [facility]  Electronically Signed    MBW/MedQ  DD: 03/31/2006  DT: 03/31/2006  Job #: [id number]

## 2010-07-03 NOTE — Assessment & Plan Note (Signed)
[facility]                               PULMONARY OFFICE NOTE   NAME:[name], Browning                    MRN:          [id number]  DATE:09/10/2005                            DOB:          1940/12/04    HISTORY:  This is a 70 year old white male with the history of  hyperlipidemia and abnormal EKG with normal left heart cath in 1994 and felt  to have a falsely positive EKG at that time.  Still able to exercise up to  four times a week at an approximately 12 minute mile pace, actively jogging  until he fell and injured his left ankle several days ago.  He is still  trying to walk on it without difficulty with weight bearing but has noticed  increased swelling and blue discoloration.  He denies any exertional chest  pain, orthopnea, PND, significant leg swelling, TIA or claudication  symptoms.   The patient also has a history of sleep apnea, is not able to tolerate CPAP.  He denies any excess hypersomnolence but does complain of daytime fatigue.   MEDICAL HISTORY:  1.  He has Attention Deficit Disorder followed by Dr. Jodi Browning.  2.  History of hemorrhoidal  bleeding with most recent colonoscopy May 22, 2001.  3.  Hyperlipidemia.  Target LDL <130 based on the fact that he was a smoker,      has borderline hypertension and positive for heart disease on both sides      of his family.   MEDICATIONS:  Taken in Detail on the worksheet, dated September 10, 2005.  Noted  the patient is now on Crestor at 10 mg at bedtime without any apparent side  effects.   SOCIAL HISTORY:  Quit smoking in 1972.  He denies any alcohol use.  He  continues to work at [employer] and [employer], is an avid jogger and reporter.   FAMILY HISTORY:  Positive for prostate cancer in his father who also had  stroke.  Diabetes in his brother, coronary artery disease with bypass  grafting in his mother at age 15.  Leukemia also in his mother.  No form of  any colon cancer.   REVIEW  OF SYSTEMS:  Taken in detail on the worksheet.  Essentially negative  except as outlined above.   PHYSICAL EXAMINATION:  GENERAL:  This is very healthy appearing, relatively  thin white male in no acute distress.  VITAL SIGNS:  Stable.  HEENT:  Ocular exam was within normal limits.  Nostrils revealed no obvious  arterial change.  Ear canals are clear bilaterally.  NECK:  Supple without cervical adenopathy or tenderness.  Trachea is  midline.  No thyromegaly. Carotid upstrokes were brisk bilaterally no  bruits.  CHEST:  Completely clear by auscultation and percussion.  CARDIAC:  Regular rate and rhythm without murmur, gallop or rub.  ABDOMEN:  Soft, benign with no palpable organomegaly, masses or tenderness.  No bruits.  GENITALIA:  Testes descended bilaterally.  No nodules.  RECTAL:     Mild BPH with good texture.  Guaiac was negative.  EXTREMITIES:      Without calf tenderness, cyanosis, clubbing or edema.  He  did have definite ecchymosis over the left medial malleolus with full range  of motion but no crepitance.  Pedal pulses were slightly reduced in the  posterior tibial distribution in symmetric fashion bilaterally.  NEUROLOGIC:  No focal deficits, __________ reflexes.  MUSCULOSKELETAL:  Normal except for the left ankle.   LABORATORY DATA:  Urinalysis was unremarkable.  PSA was 0.47.  Hematocrit  was 34.6% with a normal MCV.  Chemistry profile was unremarkable.  TSH was  normal.  Liver function tests were normal.  Serum cholesterol was 144 with  an LDL of 83 and HDL of 47.   IMPRESSION:  1.  Hyperlipidemia.  LDL at goal on Crestor 10 mg daily with no adverse side      effects.  I have asked him to continue.  2.  History of rectal bleeding not active at present.  Most likely related      to hemorrhoids based on negative colonoscopy in 2003.  3.  Attention Deficit Disorder followed by Dr. Jodi Browning.  4.  Positive family history of prostate cancer with a benign exam and normal       PSA on this occasion.  Nothing further with yearly followup recommended.  5.  Obstructive Sleep Apnea not able to tolerate mask with excessive daytime      fatigue.  The patient denies any hypersomnolence and is not interested      in seeing a sleep specialist but I have asked him to keep an open mind,      especially if his fatigue worsens.  I have emphasized that if his sleep      quality is poor he should make sure the quantity is adequate.  6.  Resting abnormal EKG unchanged from previous study with negative left      heart catheterization in 1994.  Therefore completely normal left heart      catheterization therefore felt to be false-positive.  7.  Left ankle sprain.  I have recommended ice, elevation and non-steroidals      with followup by Dr. Ethelene Browning, his orthopedic physician record, and no      jogging until he is seen next.  8.  His general health maintenance - he was updated on tetanus in 2003.      Candidate for Pneumovax next year.                                   Browning Browning. Browning Sires, MD, [facility]   MBW/MedQ  DD:  09/12/2005  DT:  09/13/2005  Job #:  [id number]

## 2010-07-03 NOTE — Op Note (Signed)
NAME:  [name], [name] Browning NO.:  [id number]   MEDICAL RECORD NO.:  [id number]                   PATIENT TYPE:  INP   LOCATION:  6962                                 FACILITY:  [facility]   PHYSICIAN:  Burnard Bunting, M.D.                 DATE OF BIRTH:  [birth year]   DATE OF PROCEDURE:  03/09/2003  DATE OF DISCHARGE:  03/10/2003                                 OPERATIVE REPORT   PREOPERATIVE DIAGNOSIS:  Left distal radius fracture.   POSTOPERATIVE DIAGNOSIS:  Left distal radius fracture.   PROCEDURE:  Open reduction and internal fixation of left distal radius  fracture.   SURGEON:  Burnard Bunting, M.D.   ASSISTANT:  Jerolyn Shin. Tresa Res, M.D.   ANESTHESIA:  General endotracheal.   ESTIMATED BLOOD LOSS:  10 mL   DRAINS:  TLS x1   TOURNIQUET TIME:  1 hour and 19 minutes at 250 mmHg.   DESCRIPTION OF PROCEDURE:  The patient was brought to the operating room and  general endotracheal anesthesia was induced. Preoperative IV antibiotics  were administered.  The left arm, hand and wrist were prepped with duraprep  solution, draped in a sterile manner, Ioban was used to cover the operative  field.  The arm was elevated and exsanguinated with the esmarch wrap,  tourniquet was inflated. At this time, the distal radius fracture was  reduced using the traction and reversal mechanism. A volar approach to the  wrist was utilized, skin was incised from the proximal wrist flexion crease  along the FCR distally over a distance of 7-8 cm. The skin and subcutaneous  tissue was sharply divided, bleeding points encountered were controlled  using bipolar electrocautery.  The FCR sheath was incised on its volar  surface. The tendon was then retracted in a radial fashion and the sheath  was incised on its ventral surface.  At this time, blunt dissection was used  to develop a plane on the pronator quadratus. The FCR and radial artery and  vein were mobilized in the radial  direction.  The pronator quadratus was  then detached from the radial aspect of the radial shaft beginning  proximally and extending distally.  The muscle was then elevated off of the  distal radius. The fracture site was visualized.  The fracture with Bennett  retractors on either side of the fracture, the fracture was reduced.  A DVR  plate was then applied first securing a shaft screw through the mobile hole  in the mid portion of the plate.  The fracture was then reduced to the  plate.  Under fluoroscopic guidance in the AP and lateral planes, good  reduction was achieved.  Distal locking screws were then placed into the  distal fragment in both planes allowed by the plate.  Good reduction and  restoration of height was achieved.  The separate lunate fossa piece was  captured with two screws.  The proximal holes were then filled in bicortical  fashion. The wrist had a good range of motion following the procedure.  Fluoroscopy was used to ensure subarticular positioning of the screws. At  this time, the tourniquet was released, bleeding points controlled using  electrocautery.  A TLS drain was  placed.  After irrigation the incision was then closed using interrupted  inverted 3-0 Vicryl suture and 3-0 nylon suture. Ioban and a volar splint  was applied. The patient was then placed in a blue cradle boot.  He  tolerated the procedure well without any complications.                                               Burnard Bunting, M.D.    GSD/MEDQ  D:  03/12/2003  T:  03/12/2003  Job:  [id number]

## 2010-07-03 NOTE — H&P (Signed)
NAME:  [name] Browning NO.:  [id number]   MEDICAL RECORD NO.:  [id number]                   PATIENT TYPE:  INP   LOCATION:  0454                                 FACILITY:  [facility]   PHYSICIAN:  Burnard Bunting, M.D.                 DATE OF BIRTH:  Apr 12, 1940   DATE OF ADMISSION:  03/09/2003  DATE OF DISCHARGE:                                HISTORY & PHYSICAL   CHIEF COMPLAINT:  Left wrist pain.   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  He denies any  other orthopedic complaints.  Denies any shoulder or neck pain.   PAST MEDICAL HISTORY:  Sleep apnea.   PAST SURGICAL HISTORY:  None.   MEDICATIONS:  Crestor, antidepressant, ADD medication, and tranquilizer.  The patient does know the names of these medications.   PHYSICAL EXAMINATION:  GENERAL:  Alert and oriented x 3.  CHEST:  Clear to auscultation.  HEART:  Regular rate and rhythm.  ABDOMEN: Exam is benign.  EXTREMITIES:  The patient has left wrist swelling and deformity with no  paresthesias.  Radial pulses 2+/4.  EPL and interosseous are functional and  intact.   LABORATORY DATA:  Labs are pending.   X-rays show about a 50 degree dorsally angulated distal radius fracture.   IMPRESSION:  Dorsally angulated distal radius fracture on the left.   PLAN:  Open reduction and internal fixation through normal approach.  Risks  and benefits are discussed with the patient.  Primary risks including but  not limited to infection, nonunion, malunion, nerve or vessel damage, as  well as need for more surgery were discussed with the patient who  understands and wishes to proceed.                                               Burnard Bunting, M.D.    GSD/MEDQ  D:  03/10/2003  T:  03/10/2003  Job:  [id number]

## 2011-02-16 DIAGNOSIS — Z23 Encounter for immunization: Secondary | ICD-10-CM | POA: Diagnosis not present

## 2011-04-08 DIAGNOSIS — N400 Enlarged prostate without lower urinary tract symptoms: Secondary | ICD-10-CM | POA: Diagnosis not present

## 2011-04-08 DIAGNOSIS — E291 Testicular hypofunction: Secondary | ICD-10-CM | POA: Diagnosis not present

## 2011-04-14 DIAGNOSIS — E291 Testicular hypofunction: Secondary | ICD-10-CM | POA: Diagnosis not present

## 2011-04-14 DIAGNOSIS — N138 Other obstructive and reflux uropathy: Secondary | ICD-10-CM | POA: Diagnosis not present

## 2011-04-14 DIAGNOSIS — N401 Enlarged prostate with lower urinary tract symptoms: Secondary | ICD-10-CM | POA: Diagnosis not present

## 2011-04-14 DIAGNOSIS — N529 Male erectile dysfunction, unspecified: Secondary | ICD-10-CM | POA: Diagnosis not present

## 2011-04-26 ENCOUNTER — Other Ambulatory Visit: Payer: Self-pay | Admitting: Internal Medicine

## 2011-05-26 ENCOUNTER — Other Ambulatory Visit: Payer: Self-pay | Admitting: Internal Medicine

## 2011-05-31 ENCOUNTER — Encounter: Payer: Self-pay | Admitting: Gastroenterology

## 2011-06-01 ENCOUNTER — Telehealth: Payer: Self-pay | Admitting: Internal Medicine

## 2011-06-01 MED ORDER — SIMVASTATIN 40 MG PO TABS: 40.0000 mg | ORAL_TABLET | Freq: Every day | ORAL | Status: DC

## 2011-06-01 NOTE — Telephone Encounter (Signed)
ATC PT NA unable to leave VM wcb.  rx has been sent--pt needs to know he must keep OV w/ MW for further refills

## 2011-06-02 NOTE — Telephone Encounter (Signed)
Pt's sposue notified of prescription and pt plans to keep f/u with MW on 06/08/11.

## 2011-06-03 DIAGNOSIS — Z961 Presence of intraocular lens: Secondary | ICD-10-CM | POA: Diagnosis not present

## 2011-06-03 DIAGNOSIS — H2510 Age-related nuclear cataract, unspecified eye: Secondary | ICD-10-CM | POA: Diagnosis not present

## 2011-06-07 ENCOUNTER — Ambulatory Visit (AMBULATORY_SURGERY_CENTER): Payer: Medicare Other | Admitting: *Deleted

## 2011-06-07 ENCOUNTER — Encounter: Payer: Self-pay | Admitting: Internal Medicine

## 2011-06-07 VITALS — Ht 67.5 in | Wt 148.4 lb

## 2011-06-07 DIAGNOSIS — Z1211 Encounter for screening for malignant neoplasm of colon: Secondary | ICD-10-CM

## 2011-06-07 MED ORDER — PEG-KCL-NACL-NASULF-NA ASC-C 100 G PO SOLR: 1.0000 | Freq: Once | ORAL | Status: DC

## 2011-06-08 ENCOUNTER — Ambulatory Visit (INDEPENDENT_AMBULATORY_CARE_PROVIDER_SITE_OTHER): Payer: Medicare Other | Admitting: Internal Medicine

## 2011-06-08 ENCOUNTER — Encounter: Payer: Self-pay | Admitting: Internal Medicine

## 2011-06-08 VITALS — BP 140/60 | HR 74 | Temp 98.7°F | Ht 67.5 in | Wt 152.0 lb

## 2011-06-08 DIAGNOSIS — E785 Hyperlipidemia, unspecified: Secondary | ICD-10-CM

## 2011-06-08 DIAGNOSIS — Z23 Encounter for immunization: Secondary | ICD-10-CM

## 2011-06-08 MED ORDER — SIMVASTATIN 40 MG PO TABS: 40.0000 mg | ORAL_TABLET | Freq: Every day | ORAL | Status: DC

## 2011-06-08 NOTE — Progress Notes (Signed)
Subjective:     Patient ID: [name] Browning, male   DOB: April 17, 1940   MRN: [id number]  HPI  34  yowm with history of hyperlipidemia, allergic rhinitis , and depression.   03/2007--presented for CPX-hx of an abnormal EKG leading to a left heart catheter in 1994 that was felt to be completely normal and therefore felt to represent a false positive EKG, still able to exercise up to 5 times weekly including intense cross-country running.   Jun 25, 2009 cc need refill ofor simvastatin, no cp tia or claudicaiton. no change   September 15, 2009 cpx no c/o's   April 30, 2010 ov cc fatigue can only sleep with meds and stopped meds due to article in paper  06/08/2011 f/u ov/Jaylin Benzel cc here for f/u hyperlipidemia but not fasting. No longer running due to joint problems, no ex cp, tia or claudication symptoms       Past Medical History:  Adult ADD........................................................................................Marland KitchenKaur  Hyperlipidemia  - Target LDL < 130 male, type A, pos fm hx  hemorrhoidal bleeding.....................................................................Marland KitchenPatterson  - colonoscopy 05/22/2001  sleep apnea seat CPAP titration to 10 recommended 10/08/2002 for a baseline RDI of 33  - stopped 2007  Health Maintenance............................................................................Marland KitchenWert  - DT 03/2001  - Pneumovax April 15, 2008  - CPX September 15, 2009  DJD Left Knee....................................................................................Marland KitchenGioffree     Review of Systems     Objective:   Physical Exam GEN: A/Ox3; pleasant , NAD, slightly anxious  wt 153 February 07, 2008 > > 147 September 15, 2009 > 151 April 30, 2010 > 152 06/08/2011  HEENT: [place]/AT, , EACs-clear, TMs-wnl, NOSE-clear, THROAT-clear  NECK: Supple w/ fair ROM; no JVD; normal carotid impulses w/o bruits; no thyromegaly or nodules palpated; no lymphadenopathy.  CHEST clear to a and P bilaterally    HEART: RRR, no m/r/g  ABDOMEN: Soft & nt; nml bowel sounds; no organomegaly or masses detected.  EXT: Warm bil, no calf pain, edema, clubbing, pulses intact    Assessment:        Plan:

## 2011-06-08 NOTE — Patient Instructions (Signed)
Schedule a CPX w/in the next 3 months fasting.

## 2011-06-11 NOTE — Assessment & Plan Note (Signed)
-   Target LDL < 130 male, type A, pos fm hx   No recent labs but was ok when last check >  1 year ago, today asymp but not fasting, but really needs full cpx anyway so ask him to come back in 3 months

## 2011-06-14 ENCOUNTER — Encounter: Payer: Self-pay | Admitting: Gastroenterology

## 2011-06-14 ENCOUNTER — Ambulatory Visit (AMBULATORY_SURGERY_CENTER): Payer: Medicare Other | Admitting: Gastroenterology

## 2011-06-14 VITALS — BP 153/87 | HR 65 | Temp 96.3°F | Resp 20 | Ht 67.0 in | Wt 152.0 lb

## 2011-06-14 DIAGNOSIS — F341 Dysthymic disorder: Secondary | ICD-10-CM | POA: Diagnosis not present

## 2011-06-14 DIAGNOSIS — G4730 Sleep apnea, unspecified: Secondary | ICD-10-CM | POA: Diagnosis not present

## 2011-06-14 DIAGNOSIS — Z1211 Encounter for screening for malignant neoplasm of colon: Secondary | ICD-10-CM | POA: Diagnosis not present

## 2011-06-14 DIAGNOSIS — K6389 Other specified diseases of intestine: Secondary | ICD-10-CM | POA: Diagnosis not present

## 2011-06-14 DIAGNOSIS — K639 Disease of intestine, unspecified: Secondary | ICD-10-CM

## 2011-06-14 DIAGNOSIS — K5520 Angiodysplasia of colon without hemorrhage: Secondary | ICD-10-CM

## 2011-06-14 MED ORDER — SODIUM CHLORIDE 0.9 % IV SOLN: 500.0000 mL | INTRAVENOUS | Status: DC

## 2011-06-14 NOTE — Patient Instructions (Signed)
Impressions/recommendations:  Normal colonoscopy  Repeat colonoscopy in 10 years.  Resume your medications as you were taking them prior to your procedure.  We have noted that your heart rhythm is atrial flutter. We have printed off a copy of the rhythm strip to take with you for Dr. Sherene Sires to observe at your next scheduled appointment which you state is in the next two weeks.  YOU HAD AN ENDOSCOPIC PROCEDURE TODAY AT THE [facility]: Refer to the procedure report that was given to you for any specific questions about what was found during the examination.  If the procedure report does not answer your questions, please call your gastroenterologist to clarify.  If you requested that your care partner not be given the details of your procedure findings, then the procedure report has been included in a sealed envelope for you to review at your convenience later.  YOU SHOULD EXPECT: Some feelings of bloating in the abdomen. Passage of more gas than usual.  Walking can help get rid of the air that was put into your GI tract during the procedure and reduce the bloating. If you had a lower endoscopy (such as a colonoscopy or flexible sigmoidoscopy) you may notice spotting of blood in your stool or on the toilet paper. If you underwent a bowel prep for your procedure, then you may not have a normal bowel movement for a few days.  DIET: Your first meal following the procedure should be a light meal and then it is ok to progress to your normal diet.  A half-sandwich or bowl of soup is an example of a good first meal.  Heavy or fried foods are harder to digest and may make you feel nauseous or bloated.  Likewise meals heavy in dairy and vegetables can cause extra gas to form and this can also increase the bloating.  Drink plenty of fluids but you should avoid alcoholic beverages for 24 hours.  ACTIVITY: Your care partner should take you home directly after the procedure.  You should plan to take it  easy, moving slowly for the rest of the day.  You can resume normal activity the day after the procedure however you should NOT DRIVE or use heavy machinery for 24 hours (because of the sedation medicines used during the test).    SYMPTOMS TO REPORT IMMEDIATELY: A gastroenterologist can be reached at any hour.  During normal business hours, 8:30 AM to 5:00 PM Monday through Friday, call [phone].  After hours and on weekends, please call the GI answering service at [phone] who will take a message and have the physician on call contact you.   Following lower endoscopy (colonoscopy or flexible sigmoidoscopy):  Excessive amounts of blood in the stool  Significant tenderness or worsening of abdominal pains  Swelling of the abdomen that is new, acute  Fever of 100F or higher  Following upper endoscopy (EGD)  Vomiting of blood or coffee ground material  New chest pain or pain under the shoulder blades  Painful or persistently difficult swallowing  New shortness of breath  Fever of 100F or higher  Black, tarry-looking stools  FOLLOW UP: If any biopsies were taken you will be contacted by phone or by letter within the next 1-3 weeks.  Call your gastroenterologist if you have not heard about the biopsies in 3 weeks.  Our staff will call the home number listed on your records the next business day following your procedure to check on you and address any  questions or concerns that you may have at that time regarding the information given to you following your procedure. This is a courtesy call and so if there is no answer at the home number and we have not heard from you through the emergency physician on call, we will assume that you have returned to your regular daily activities without incident.  SIGNATURES/CONFIDENTIALITY: You and/or your care partner have signed paperwork which will be entered into your electronic medical record.  These signatures attest to the fact that that the  information above on your After Visit Summary has been reviewed and is understood.  Full responsibility of the confidentiality of this discharge information lies with you and/or your care-partner.

## 2011-06-14 NOTE — Progress Notes (Signed)
Patient did not experience any of the following events: a burn prior to discharge; a fall within the facility; wrong site/side/patient/procedure/implant event; or a hospital transfer or hospital admission upon discharge from the facility. (G8907) Patient did not have preoperative order for IV antibiotic SSI prophylaxis. (G8918)  

## 2011-06-14 NOTE — Op Note (Signed)
[facility] [address] [place], [place]  [address]  COLONOSCOPY PROCEDURE REPORT  PATIENT:  [name] Browning, [name] Browning  MR#:  [id number] BIRTHDATE:  04-21-1940, 71 yrs. old  GENDER:  male ENDOSCOPIST:  [name] Rea. Jarold Motto, MD, [facility] REF. BY: PROCEDURE DATE:  06/14/2011 PROCEDURE:  Average-risk screening colonoscopy G0121 ASA CLASS:  Class II INDICATIONS:  Routine Risk Screening MEDICATIONS:   propofol (Diprivan) 150 mg IV  DESCRIPTION OF PROCEDURE:   After the risks and benefits and of the procedure were explained, informed consent was obtained. Digital rectal exam was performed and revealed no abnormalities. The LB [id number] endoscope was introduced through the anus and advanced to the cecum, which was identified by both the appendix and ileocecal valve.  The quality of the prep was excellent, using MoviPrep.  The instrument was then slowly withdrawn as the colon was fully examined. <<PROCEDUREIMAGES>>  FINDINGS:  Arteriovenous malformations were seen in the in the cecum. SMALL TELANGEICTATIC LESION IN CECUM,NOT BLEEDING.SEE PICTURE.EXCELLENT PREP!!!  No polyps or cancers were seen.  This was otherwise a normal examination of the colon.   Retroflexed views in the rectum revealed no abnormalities.    The scope was then withdrawn from the patient and the procedure completed.  COMPLICATIONS:  None ENDOSCOPIC IMPRESSION: 1) AVM's in the cecum RECOMMENDATIONS: 1) Given your age, you will not need another colonoscopy for colon cancer screening or polyp surveillance. These types of tests usually stop around the age 38. 2) Continue current colorectal screening recommendations for "routine risk" patients with a repeat colonoscopy in 10 years.  REPEAT EXAM:  No  ______________________________ [name] Rea. Jarold Motto, MD, [name] Browning  CC:  Nyoka Cowden, MD  n. Rosalie DoctorMarland Kitchen   [name] Rea. Aymar Whitfill at 06/14/2011 11:41 AM  Louanna Raw, [phone]

## 2011-06-15 ENCOUNTER — Telehealth: Payer: Self-pay | Admitting: *Deleted

## 2011-06-15 NOTE — Telephone Encounter (Signed)
  Follow up Call-  Call back number 06/14/2011  Post procedure Call Back phone  # [phone]  Permission to leave phone message Yes     Patient questions:  Message left to call if necessary.

## 2011-06-16 ENCOUNTER — Telehealth: Payer: Self-pay | Admitting: Gastroenterology

## 2011-06-16 ENCOUNTER — Telehealth: Payer: Self-pay | Admitting: Internal Medicine

## 2011-06-16 NOTE — Telephone Encounter (Signed)
Pt's wife wanted to know why Dr Thurston Hole ofc called them. Explained to her per notes that Dr Jarold Motto had reported afib during his procedure; wife stated understanding.

## 2011-06-16 NOTE — Telephone Encounter (Signed)
I spoke with spouse and he is scheduled to come in on Friday 06/18/11 at 10:00 for OV.

## 2011-06-16 NOTE — Telephone Encounter (Signed)
Needs ov with 12 lead ekg w/in a week (ok to add on) as informed by Dr Jarold Motto of abn rhythm during colon exam with alternative being return to his cardiologist of record

## 2011-06-18 ENCOUNTER — Ambulatory Visit (INDEPENDENT_AMBULATORY_CARE_PROVIDER_SITE_OTHER)
Admission: RE | Admit: 2011-06-18 | Discharge: 2011-06-18 | Disposition: A | Discharge status: Home / Self Care | Payer: Medicare Other | Source: Ambulatory Visit | Attending: Internal Medicine | Admitting: Internal Medicine

## 2011-06-18 ENCOUNTER — Encounter: Payer: Self-pay | Admitting: Internal Medicine

## 2011-06-18 ENCOUNTER — Other Ambulatory Visit (INDEPENDENT_AMBULATORY_CARE_PROVIDER_SITE_OTHER): Payer: Medicare Other

## 2011-06-18 ENCOUNTER — Ambulatory Visit (INDEPENDENT_AMBULATORY_CARE_PROVIDER_SITE_OTHER): Payer: Medicare Other | Admitting: Internal Medicine

## 2011-06-18 ENCOUNTER — Telehealth: Payer: Self-pay | Admitting: Internal Medicine

## 2011-06-18 VITALS — BP 140/90 | HR 70 | Temp 98.1°F | Ht 67.5 in | Wt 149.8 lb

## 2011-06-18 DIAGNOSIS — J309 Allergic rhinitis, unspecified: Secondary | ICD-10-CM

## 2011-06-18 DIAGNOSIS — R0989 Other specified symptoms and signs involving the circulatory and respiratory systems: Secondary | ICD-10-CM

## 2011-06-18 DIAGNOSIS — I4892 Unspecified atrial flutter: Secondary | ICD-10-CM | POA: Diagnosis not present

## 2011-06-18 DIAGNOSIS — I10 Essential (primary) hypertension: Secondary | ICD-10-CM

## 2011-06-18 DIAGNOSIS — R0609 Other forms of dyspnea: Secondary | ICD-10-CM | POA: Diagnosis not present

## 2011-06-18 DIAGNOSIS — I4891 Unspecified atrial fibrillation: Secondary | ICD-10-CM | POA: Diagnosis not present

## 2011-06-18 DIAGNOSIS — R0600 Dyspnea, unspecified: Secondary | ICD-10-CM

## 2011-06-18 DIAGNOSIS — E785 Hyperlipidemia, unspecified: Secondary | ICD-10-CM | POA: Diagnosis not present

## 2011-06-18 HISTORY — DX: Dyspnea, unspecified: R06.00

## 2011-06-18 HISTORY — DX: Essential (primary) hypertension: I10

## 2011-06-18 LAB — BASIC METABOLIC PANEL
Calcium: 8.9 mg/dL (ref 8.4–10.5)
GFR: 63.56 mL/min (ref >60.00)
Potassium: 3.8 mEq/L (ref 3.5–5.1)
Sodium: 139 mEq/L (ref 135–145)

## 2011-06-18 LAB — HEPATIC FUNCTION PANEL
ALT: 22 U/L (ref 0–53)
Bilirubin, Direct: 0.1 mg/dL (ref 0.0–0.3)
Total Bilirubin: 1 mg/dL (ref 0.3–1.2)

## 2011-06-18 LAB — URINALYSIS
Leukocytes, UA: NEGATIVE
Nitrite: NEGATIVE
Specific Gravity, Urine: 1.02 (ref 1.000–1.030)
Urobilinogen, UA: 0.2 (ref 0.0–1.0)

## 2011-06-18 LAB — CBC WITH DIFFERENTIAL/PLATELET
Basophils Absolute: 0 10*3/uL (ref 0.0–0.1)
Eosinophils Absolute: 0.1 10*3/uL (ref 0.0–0.7)
Lymphocytes Relative: 25.4 % (ref 12.0–46.0)
MCHC: 33.4 g/dL (ref 30.0–36.0)
Neutrophils Relative %: 64.5 % (ref 43.0–77.0)
RDW: 12.6 % (ref 11.5–14.6)

## 2011-06-18 LAB — LIPID PANEL
HDL: 51.2 mg/dL (ref >39.00)
LDL Cholesterol: 64 mg/dL (ref 0–99)
Total CHOL/HDL Ratio: 3
Triglycerides: 112 mg/dL (ref 0.0–149.0)

## 2011-06-18 LAB — BRAIN NATRIURETIC PEPTIDE: Pro B Natriuretic peptide (BNP): 70 pg/mL (ref 0.0–100.0)

## 2011-06-18 NOTE — Telephone Encounter (Signed)
I spoke with the pt about labs and cxr results and he verbalized understanding. Pt states nothing further needed.

## 2011-06-18 NOTE — Assessment & Plan Note (Signed)
Borderline but assoc with aflutter and elelvated lvedp in 1994 so ? Complicated by diastolic dysfunction rx per cards noting his pulse is already on low side despite aflutter.

## 2011-06-18 NOTE — Progress Notes (Signed)
Subjective:     Patient ID: [name] Browning, male   DOB: [birth year]   MRN: [id number]  HPI  62  yowm with history of hyperlipidemia, allergic rhinitis , and depression.   03/2007--presented for CPX-hx of an abnormal EKG leading to a left heart catheter in 1994 that was felt to be completely normal and therefore felt to represent a false positive EKG, still able to exercise up to 5 times weekly including intense cross-country running.   Jun 25, 2009 cc need refill ofor simvastatin, no cp tia or claudicaiton. no change   September 15, 2009 cpx no c/o's   April 30, 2010 ov cc fatigue can only sleep with meds and stopped meds due to article in paper  06/08/2011 f/u ov/Krish Bailly cc here for f/u hyperlipidemia but not fasting. No longer running due to joint problems, no ex cp, tia or claudication symptoms rec cpx due   06/18/2011 f/u ov/Micahel Omlor cc aflutter noted at @ colonoscopy  06/14/11 and pt completely asym x "a little more fatigued" s palp, cp, sob, presyncope.  Denies excess caffeine or decongestant use  Sleeping ok without nocturnal  or early am exacerbation  of respiratory  c/o's  . Also denies any obvious fluctuation of symptoms with weather or environmental changes or other aggravating or alleviating factors except as outlined above  ROS  At present neg for  any significant sore throat, dysphagia, dental problems, itching, sneezing,  nasal congestion or excess/ purulent secretions, ear ache,   fever, chills, sweats, unintended wt loss, pleuritic or exertional cp, hemoptysis, palpitations, orthopnea pnd or leg swelling.  Also denies presyncope, palpitations, heartburn, abdominal pain, anorexia, nausea, vomiting, diarrhea  or change in bowel or urinary habits, change in stools or urine, dysuria,hematuria,  rash, arthralgias, visual complaints, headache, numbness weakness or ataxia or problems with walking or coordination. No noted change in mood/affect or memory.                        Past  Medical History:  Adult ADD........................................................................................Marland KitchenKaur  Hyperlipidemia  - Target LDL < 130 male, type A, pos fm hx  hemorrhoidal bleeding.....................................................................Marland KitchenPatterson  - colonoscopy 05/22/2001  sleep apnea seat CPAP titration to 10 recommended 10/08/2002 for a baseline RDI of 33  - stopped 2007  Health Maintenance............................................................................Marland KitchenWert  - DT 03/2001  - Pneumovax April 15, 2008  - CPX September 15, 2009  DJD Left Knee....................................................................................Marland KitchenGioffree  New A flutter 06/18/2011 ........................................................................[name] [place] Cards          Objective:   Physical Exam GEN: A/Ox3; pleasant , NAD, slightly anxious  wt 153 February 07, 2008 > > 147 September 15, 2009 > 151 April 30, 2010 > 152 06/08/2011 > 06/18/2011  149 HEENT: [place]/AT, , EACs-clear, TMs-wnl, NOSE-clear, THROAT-clear  NECK: Supple w/ fair ROM; no JVD; normal carotid impulses w/o bruits; no thyromegaly or nodules palpated; no lymphadenopathy.  CHEST clear to a and P bilaterally  HEART: RRR, no m/r/g  Pulse 70 and perfectly regular ABDOMEN: Soft & nt; nml bowel sounds; no organomegaly or masses detected.  EXT: Warm bil, no calf pain, edema, clubbing, pulses intact    CXR  06/18/2011 :  Findings: Cardiomediastinal silhouette is stable. Mild hyperinflation again noted. No acute infiltrate or pleural effusion. No pulmonary edema. Stable mild degenerative changes thoracic spine.  IMPRESSION: No active disease. Mild hyperinflation again noted.   Assessment:        Plan:

## 2011-06-18 NOTE — Assessment & Plan Note (Addendum)
-   Target LDL < 130 male, type A, pos fm hx   Adequate control on present rx, reviewed rx

## 2011-06-18 NOTE — Progress Notes (Signed)
Quick Note:  Spoke with pt and notified of results per Dr. Wert. Pt verbalized understanding and denied any questions.  ______ 

## 2011-06-18 NOTE — Patient Instructions (Addendum)
Please remember to go to the lab and x-ray department downstairs for your tests - we will call you with the results when they are available.    Please see patient coordinator before you leave today  to schedule Cardiology evaluation for atrial flutter  Increase aspirin to 325 mg one daily but take the coated type.  Add: echo also ordered

## 2011-06-18 NOTE — Assessment & Plan Note (Addendum)
-   Left Heart Cath nl 1994 ([place]) x LVEDP 17     - Onset ? (asymptomatic    - TSH 06/18/2011 > nl as is BNP    - Echo ordered 06/18/2011 >>>  I had an extended discussion with the patient/wife  today lasting 15 to 20 minutes of a 25 minute visit on the following issues:   Asympt flutter with controlled ventricular rate ? Duration > needs to at least take a whole asa daily and refer to cards

## 2011-06-29 ENCOUNTER — Other Ambulatory Visit (HOSPITAL_COMMUNITY): Payer: Medicare Other

## 2011-07-01 ENCOUNTER — Other Ambulatory Visit: Payer: Self-pay | Admitting: Internal Medicine

## 2011-07-09 ENCOUNTER — Other Ambulatory Visit (HOSPITAL_COMMUNITY): Payer: Self-pay | Admitting: Radiology

## 2011-07-09 DIAGNOSIS — R0600 Dyspnea, unspecified: Secondary | ICD-10-CM

## 2011-07-13 ENCOUNTER — Ambulatory Visit (HOSPITAL_COMMUNITY): Payer: Medicare Other | Attending: Internal Medicine

## 2011-07-13 ENCOUNTER — Other Ambulatory Visit: Payer: Self-pay

## 2011-07-13 DIAGNOSIS — I059 Rheumatic mitral valve disease, unspecified: Secondary | ICD-10-CM | POA: Diagnosis not present

## 2011-07-13 DIAGNOSIS — R0609 Other forms of dyspnea: Secondary | ICD-10-CM | POA: Insufficient documentation

## 2011-07-13 DIAGNOSIS — I4892 Unspecified atrial flutter: Secondary | ICD-10-CM | POA: Diagnosis not present

## 2011-07-13 DIAGNOSIS — Z87891 Personal history of nicotine dependence: Secondary | ICD-10-CM | POA: Diagnosis not present

## 2011-07-13 DIAGNOSIS — R42 Dizziness and giddiness: Secondary | ICD-10-CM | POA: Diagnosis not present

## 2011-07-13 DIAGNOSIS — I10 Essential (primary) hypertension: Secondary | ICD-10-CM | POA: Diagnosis not present

## 2011-07-13 DIAGNOSIS — R0989 Other specified symptoms and signs involving the circulatory and respiratory systems: Secondary | ICD-10-CM | POA: Insufficient documentation

## 2011-07-13 DIAGNOSIS — R0600 Dyspnea, unspecified: Secondary | ICD-10-CM

## 2011-07-13 DIAGNOSIS — E785 Hyperlipidemia, unspecified: Secondary | ICD-10-CM | POA: Diagnosis not present

## 2011-07-13 DIAGNOSIS — R5383 Other fatigue: Secondary | ICD-10-CM | POA: Insufficient documentation

## 2011-07-13 DIAGNOSIS — R5381 Other malaise: Secondary | ICD-10-CM | POA: Diagnosis not present

## 2011-07-15 ENCOUNTER — Encounter (HOSPITAL_COMMUNITY): Payer: Self-pay | Admitting: Respiratory Therapy

## 2011-07-16 ENCOUNTER — Encounter: Payer: Self-pay | Admitting: *Deleted

## 2011-07-16 ENCOUNTER — Encounter: Payer: Self-pay | Admitting: Internal Medicine

## 2011-07-16 ENCOUNTER — Ambulatory Visit (INDEPENDENT_AMBULATORY_CARE_PROVIDER_SITE_OTHER): Payer: Medicare Other | Admitting: Internal Medicine

## 2011-07-16 VITALS — BP 110/76 | HR 71 | Ht 67.5 in | Wt 145.8 lb

## 2011-07-16 DIAGNOSIS — I4892 Unspecified atrial flutter: Secondary | ICD-10-CM

## 2011-07-16 DIAGNOSIS — I10 Essential (primary) hypertension: Secondary | ICD-10-CM | POA: Diagnosis not present

## 2011-07-16 MED ORDER — RIVAROXABAN 20 MG PO TABS: 20.0000 mg | ORAL_TABLET | Freq: Every day | ORAL | Status: DC

## 2011-07-16 NOTE — Progress Notes (Signed)
 CARDIOLOGY CONSULT NOTE  Patient ID: [name] Browning, MRN: [id number], DOB/AGE: 04/07/1940 71 y.o. Admit date: (Not on file) Date of Consult: 07/16/2011  Primary Physician: Michael Wert, MD, MD Primary Cardiologist:   Chief Complaint: Atrial flutter   HPI [name] Browning is a 71 y.o. male  :  Seen after the identification of asymptomatic atrial flutter. This was noted during a colonoscopy.  Thromboembolic risk factors are notable only for age so he has a CHADS-VASc score of 1.  An echocardiogram done earlier this year demonstrated normal left ventricular function and normal atrial size.    Past Medical History  Diagnosis Date  . ADD (attention deficit disorder)   . Hyperlipidemia   . DJD (degenerative joint disease) of knee   . Depression   . Anxiety   . Cataracts, bilateral   . Heart murmur   . Sleep apnea     could not tolerate cpap      Surgical History:  Past Surgical History  Procedure Date  . Wrist fracture surgery 2007    left     Home Meds: Prior to Admission medications   Medication Sig Start Date End Date Taking? Authorizing Provider  amphetamine-dextroamphetamine (ADDERALL) 20 MG tablet Take 20 mg by mouth 3 (three) times daily.   Yes Historical Provider, MD  ARIPiprazole (ABILIFY) 2 MG tablet Take 2 mg by mouth daily.   Yes Historical Provider, MD  Armodafinil (NUVIGIL) 250 MG tablet Take 250 mg by mouth daily.   Yes Historical Provider, MD  aspirin 325 MG EC tablet Take 325 mg by mouth daily.   Yes Historical Provider, MD  buPROPion (WELLBUTRIN XL) 150 MG 24 hr tablet Take 150 mg by mouth daily.   Yes Historical Provider, MD  clonazePAM (KLONOPIN) 0.5 MG tablet Take 0.5 mg by mouth 2 (two) times daily.   Yes Historical Provider, MD  Ferrous Gluconate (IRON) 240 (27 FE) MG TABS Take 240 mg by mouth daily.    Yes Historical Provider, MD  fish oil-omega-3 fatty acids 1000 MG capsule Take 1 g by mouth daily.   Yes Historical Provider, MD  simvastatin  (ZOCOR) 40 MG tablet Take 1 tablet (40 mg total) by mouth at bedtime. 06/08/11  Yes Michael B Wert, MD  Testosterone (ANDROGEL) 20.25 MG/1.25GM (1.62%) GEL Place 2 sprays onto the skin every morning.   Yes Historical Provider, MD  traZODone (DESYREL) 50 MG tablet Take 100 mg by mouth at bedtime.    Yes Historical Provider, MD  tretinoin (RETIN-A) 0.01 % gel Apply 1 application topically at bedtime.  06/08/11  Yes Historical Provider, MD  VIIBRYD 40 MG TABS Take 40 mg by mouth every morning.  06/07/11  Yes Historical Provider, MD    Inpatient Medications:    Allergies: No Known Allergies  History   Social History  . Marital Status: Married    Spouse Name: N/A    Number of Children: N/A  . Years of Education: N/A   Occupational History  . Retired- writer for [employer]    Social History Main Topics  . Smoking status: Former Smoker    Types: Cigarettes    Quit date: 02/16/1968  . Smokeless tobacco: Not on file  . Alcohol Use: No  . Drug Use: Not on file  . Sexually Active: Not on file   Other Topics Concern  . Not on file   Social History Narrative  . No narrative on file     Family History  Problem   Relation Age of Onset  . Prostate cancer Father   . Heart disease Mother   . Leukemia Mother   . Colon cancer Neg Hx   . Stomach cancer Neg Hx      ROS:  Please see the history of present illness.    All other systems reviewed and negative.    Physical Exam:  Blood pressure 110/76, pulse 71, height 5' 7.5" (1.715 m), weight 145 lb 12.8 oz (66.134 kg). General: Well developed, well nourished male in no acute distress. Head: Normocephalic, atraumatic, sclera non-icteric, no xanthomas, nares are without discharge. Lymph Nodes:  none Neck: Negative for carotid bruits. JVD not elevated.flutter waves detected Lungs: Clear bilaterally to auscultation without wheezes, rales, or rhonchi. Breathing is unlabored. Heart: RRR with S1 S2. No murmurs, rubs, or gallops  appreciated. Abdomen: Soft, non-tender, non-distended with normoactive bowel sounds. No hepatomegaly. No rebound/guarding. No obvious abdominal masses. Msk:  Strength and tone appear normal for age. Extremities: No clubbing or cyanosis. No edema.  Distal pedal pulses are 2+ and equal bilaterally. Skin: Warm and Dry Neuro: Alert and oriented X 3. CN III-XII intact Grossly normal sensory and motor function . Psych:  Responds to questions appropriately with a normal affect.      Labs: CBC Lab Results  Component Value Date   WBC 6.2 06/18/2011   HGB 14.2 06/18/2011   HCT 42.5 06/18/2011   MCV 97.2 06/18/2011   PLT 255.0 06/18/2011   Lipids Lab Results  Component Value Date   CHOL 138 06/18/2011   HDL 51.20 06/18/2011   LDLCALC 64 06/18/2011   TRIG 112.0 06/18/2011   BNP Pro B Natriuretic peptide (BNP)  Date/Time Value Range Status  06/18/2011 11:15 AM 70.0  0.0-100.0 (pg/mL) Final  09/15/2009  9:14 AM 59.6  0.0-100.0 (pg/mL) Final   Miscellaneous No results found for this basename: DDIMER    Radiology/Studies:  Dg Chest 2 View  06/18/2011  *RADIOLOGY REPORT*  Clinical Data: Hypertension, atrial flutter  CHEST - 2 VIEW  Comparison: 04/15/08  Findings: Cardiomediastinal silhouette is stable.  Mild hyperinflation again noted.  No acute infiltrate or pleural effusion.  No pulmonary edema.  Stable mild degenerative changes thoracic spine.  IMPRESSION: No active disease.  Mild hyperinflation again noted.  Original Report Authenticated By: LIVIU POP, M.D.    EKG: Atrial flutter with 4:1 conduction   Assessment and Plan:  Paytin Ramakrishnan  

## 2011-07-16 NOTE — Assessment & Plan Note (Signed)
Not evident today; but this would certainly at to his thromboembolic risk

## 2011-07-16 NOTE — Progress Notes (Signed)
Quick Note:  Spoke with pt and notified of results per Dr. Wert. Pt verbalized understanding and denied any questions.  ______ 

## 2011-07-16 NOTE — Patient Instructions (Addendum)
Your physician has recommended that you have an ablation. Catheter ablation is a medical procedure used to treat some cardiac arrhythmias (irregular heartbeats). During catheter ablation, a long, thin, flexible tube is put into a blood vessel in your groin (upper thigh), or neck. This tube is called an ablation catheter. It is then guided to your heart through the blood vessel. Radio frequency waves destroy small areas of heart tissue where abnormal heartbeats may cause an arrhythmia to start. Please see the instruction sheet given to you today.  Your physician has recommended you make the following change in your medication:  1) Start Xarelto 20 mg one tablet by mouth every evening.

## 2011-07-16 NOTE — Assessment & Plan Note (Signed)
The patient has aysmptomatic atrial flutter wuth CHADVAS score of 1-2 ?HTN.  Given the procedural risk and the residual risk associated with no anticoagulation with the CHADS-VASc score of 1, I would favor undertaking catheter ablation of his atrial flutter substrate. We have talked about the risk of recurrence of atrial flutter as well as a subsequent recurrence of atrial fibrillation. Given his multiple medications he takes for his ADHD and depression, we will undertake this with general anesthesia support. We have reviewed the potential benefits and risks including but not limited to perforation vascular injury and pacemaker implantation he understands and would like to proceed.  He is scheduled to playing golf determined next weekend and so we will reschedule him for 3 weeks away. We will begin him on anticoagulation today with Rivaroxaban the risks of which we discussed and we will then plan to undertake catheter ablation without antecedent TEE

## 2011-07-21 ENCOUNTER — Encounter (HOSPITAL_COMMUNITY): Admission: RE | Payer: Self-pay | Source: Ambulatory Visit

## 2011-07-21 SURGERY — ECHOCARDIOGRAM, TRANSESOPHAGEAL
Anesthesia: Moderate Sedation

## 2011-07-26 ENCOUNTER — Telehealth: Payer: Self-pay | Admitting: Internal Medicine

## 2011-07-26 NOTE — Telephone Encounter (Signed)
I spoke with the patient's wife and she is aware samples of xarelto have been placed at the front desk with his name on it.  Xarelto 20 mg Lot: [id number] Exp: 12/14 # 10

## 2011-07-26 NOTE — Telephone Encounter (Signed)
Sample of xarelto 20 mg.

## 2011-07-30 ENCOUNTER — Other Ambulatory Visit (INDEPENDENT_AMBULATORY_CARE_PROVIDER_SITE_OTHER): Payer: Medicare Other

## 2011-07-30 DIAGNOSIS — I4892 Unspecified atrial flutter: Secondary | ICD-10-CM

## 2011-07-30 LAB — CBC WITH DIFFERENTIAL/PLATELET
Basophils Absolute: 0 10*3/uL (ref 0.0–0.1)
Eosinophils Absolute: 0.1 10*3/uL (ref 0.0–0.7)
HCT: 40.9 % (ref 39.0–52.0)
Lymphs Abs: 1.6 10*3/uL (ref 0.7–4.0)
MCHC: 33.5 g/dL (ref 30.0–36.0)
MCV: 98 fl (ref 78.0–100.0)
Monocytes Absolute: 0.6 10*3/uL (ref 0.1–1.0)
Monocytes Relative: 7.3 % (ref 3.0–12.0)
Platelets: 258 10*3/uL (ref 150.0–400.0)
RDW: 12.8 % (ref 11.5–14.6)

## 2011-07-30 LAB — BASIC METABOLIC PANEL
Calcium: 8.5 mg/dL (ref 8.4–10.5)
GFR: 54.99 mL/min — ABNORMAL LOW (ref >60.00)
Sodium: 141 mEq/L (ref 135–145)

## 2011-08-05 ENCOUNTER — Other Ambulatory Visit: Payer: Self-pay | Admitting: Internal Medicine

## 2011-08-05 DIAGNOSIS — I4892 Unspecified atrial flutter: Secondary | ICD-10-CM

## 2011-08-05 MED ORDER — SODIUM CHLORIDE 0.9 % IV SOLN: INTRAVENOUS | Status: DC

## 2011-08-05 MED ORDER — RIVAROXABAN 20 MG PO TABS: 20.0000 mg | ORAL_TABLET | Freq: Every day | ORAL | Status: DC

## 2011-08-06 ENCOUNTER — Encounter (HOSPITAL_COMMUNITY): Payer: Self-pay | Admitting: *Deleted

## 2011-08-06 ENCOUNTER — Ambulatory Visit (HOSPITAL_COMMUNITY): Payer: Medicare Other | Admitting: *Deleted

## 2011-08-06 ENCOUNTER — Ambulatory Visit (HOSPITAL_COMMUNITY): Admission: RE | Admit: 2011-08-06 | Payer: Medicare Other | Source: Ambulatory Visit | Admitting: Cardiovascular Disease

## 2011-08-06 ENCOUNTER — Encounter (HOSPITAL_COMMUNITY)
Admission: RE | Disposition: A | Discharge status: Home / Self Care | Payer: Self-pay | Source: Ambulatory Visit | Attending: Internal Medicine

## 2011-08-06 ENCOUNTER — Ambulatory Visit (HOSPITAL_COMMUNITY)
Admission: RE | Admit: 2011-08-06 | Discharge: 2011-08-07 | Disposition: A | Discharge status: Home / Self Care | Payer: Medicare Other | Source: Ambulatory Visit | Attending: Internal Medicine | Admitting: Internal Medicine

## 2011-08-06 DIAGNOSIS — Z1211 Encounter for screening for malignant neoplasm of colon: Secondary | ICD-10-CM

## 2011-08-06 DIAGNOSIS — I4892 Unspecified atrial flutter: Secondary | ICD-10-CM | POA: Insufficient documentation

## 2011-08-06 HISTORY — PX: ATRIAL FLUTTER ABLATION: SHX5733

## 2011-08-06 SURGERY — ATRIAL FLUTTER ABLATION
Anesthesia: Monitor Anesthesia Care

## 2011-08-06 MED ORDER — SODIUM CHLORIDE 0.9 % IJ SOLN: 3.0000 mL | Freq: Two times a day (BID) | INTRAMUSCULAR | Status: DC

## 2011-08-06 MED ORDER — SODIUM CHLORIDE 0.9 % IV SOLN
INTRAVENOUS | Status: AC
  Administered 2011-08-06: 12:00:00 via INTRAVENOUS

## 2011-08-06 MED ORDER — ONDANSETRON HCL 4 MG/2ML IJ SOLN
4.0000 mg | Freq: Once | INTRAMUSCULAR | Status: DC | PRN
Start: 2011-08-06 — End: 2011-08-06

## 2011-08-06 MED ORDER — BUPIVACAINE HCL (PF) 0.25 % IJ SOLN
INTRAMUSCULAR | Status: AC
  Filled 2011-08-06: qty 60

## 2011-08-06 MED ORDER — ARMODAFINIL 250 MG PO TABS
250.0000 mg | ORAL_TABLET | Freq: Every day | ORAL | Status: DC
  Filled 2011-08-06 (×2): qty 1

## 2011-08-06 MED ORDER — VILAZODONE HCL 40 MG PO TABS
40.0000 mg | ORAL_TABLET | Freq: Every morning | ORAL | Status: DC
  Filled 2011-08-06 (×2): qty 1

## 2011-08-06 MED ORDER — LIDOCAINE HCL (CARDIAC) 20 MG/ML IV SOLN
INTRAVENOUS | Status: DC | PRN
  Administered 2011-08-06: 100 mg via INTRAVENOUS

## 2011-08-06 MED ORDER — ONDANSETRON HCL 4 MG/2ML IJ SOLN
INTRAMUSCULAR | Status: DC | PRN
  Administered 2011-08-06: 4 mg via INTRAVENOUS

## 2011-08-06 MED ORDER — AMPHETAMINE-DEXTROAMPHETAMINE 10 MG PO TABS
20.0000 mg | ORAL_TABLET | Freq: Three times a day (TID) | ORAL | Status: DC
  Filled 2011-08-06: qty 2

## 2011-08-06 MED ORDER — ARIPIPRAZOLE 2 MG PO TABS
2.0000 mg | ORAL_TABLET | Freq: Every day | ORAL | Status: DC
  Filled 2011-08-06 (×3): qty 1

## 2011-08-06 MED ORDER — SIMVASTATIN 40 MG PO TABS
40.0000 mg | ORAL_TABLET | Freq: Every day | ORAL | Status: DC
  Administered 2011-08-06: 40 mg via ORAL
  Filled 2011-08-06 (×2): qty 1

## 2011-08-06 MED ORDER — ACETAMINOPHEN 10 MG/ML IV SOLN
1000.0000 mg | Freq: Once | INTRAVENOUS | Status: AC | PRN
  Filled 2011-08-06: qty 100

## 2011-08-06 MED ORDER — MIDAZOLAM HCL 5 MG/5ML IJ SOLN
INTRAMUSCULAR | Status: DC | PRN
  Administered 2011-08-06: 2 mg via INTRAVENOUS

## 2011-08-06 MED ORDER — RIVAROXABAN 10 MG PO TABS
20.0000 mg | ORAL_TABLET | Freq: Every day | ORAL | Status: DC
  Administered 2011-08-06: 20 mg via ORAL
  Filled 2011-08-06 (×2): qty 2

## 2011-08-06 MED ORDER — CLONAZEPAM 0.5 MG PO TABS
0.5000 mg | ORAL_TABLET | Freq: Two times a day (BID) | ORAL | Status: DC
  Filled 2011-08-06: qty 1

## 2011-08-06 MED ORDER — ACETAMINOPHEN 325 MG PO TABS: 650.0000 mg | ORAL_TABLET | ORAL | Status: DC | PRN

## 2011-08-06 MED ORDER — HEPARIN (PORCINE) IN NACL 2-0.9 UNIT/ML-% IJ SOLN
INTRAMUSCULAR | Status: AC
  Filled 2011-08-06: qty 1000

## 2011-08-06 MED ORDER — ONDANSETRON HCL 4 MG/2ML IJ SOLN: 4.0000 mg | Freq: Four times a day (QID) | INTRAMUSCULAR | Status: DC | PRN

## 2011-08-06 MED ORDER — FENTANYL CITRATE 0.05 MG/ML IJ SOLN
INTRAMUSCULAR | Status: DC | PRN
  Administered 2011-08-06: 50 ug via INTRAVENOUS

## 2011-08-06 MED ORDER — SODIUM CHLORIDE 0.9 % IV SOLN: 500.0000 mL | INTRAVENOUS | Status: DC

## 2011-08-06 MED ORDER — HYDROMORPHONE HCL PF 1 MG/ML IJ SOLN: 0.2500 mg | INTRAMUSCULAR | Status: DC | PRN

## 2011-08-06 MED ORDER — PHENYLEPHRINE HCL 10 MG/ML IJ SOLN: 10.0000 mg | INTRAVENOUS | Status: DC | PRN

## 2011-08-06 MED ORDER — PROPOFOL 10 MG/ML IV BOLUS
INTRAVENOUS | Status: DC | PRN
  Administered 2011-08-06: 200 mg via INTRAVENOUS

## 2011-08-06 MED ORDER — SODIUM CHLORIDE 0.9 % IV SOLN: 250.0000 mL | INTRAVENOUS | Status: DC | PRN

## 2011-08-06 MED ORDER — LACTATED RINGERS IV SOLN
INTRAVENOUS | Status: DC | PRN
  Administered 2011-08-06: 09:00:00 via INTRAVENOUS

## 2011-08-06 MED ORDER — PHENYLEPHRINE HCL 10 MG/ML IJ SOLN
10.0000 mg | INTRAVENOUS | Status: DC | PRN
  Administered 2011-08-06: 10 ug/min via INTRAVENOUS

## 2011-08-06 MED ORDER — SUCCINYLCHOLINE CHLORIDE 20 MG/ML IJ SOLN
INTRAMUSCULAR | Status: DC | PRN
  Administered 2011-08-06: 100 mg via INTRAVENOUS

## 2011-08-06 MED ORDER — SODIUM CHLORIDE 0.9 % IJ SOLN: 3.0000 mL | INTRAMUSCULAR | Status: DC | PRN

## 2011-08-06 MED ORDER — BUPROPION HCL ER (XL) 150 MG PO TB24
150.0000 mg | ORAL_TABLET | Freq: Every day | ORAL | Status: DC
Start: 2011-08-06 — End: 2011-08-07
  Filled 2011-08-06 (×2): qty 1

## 2011-08-06 MED ORDER — TRAZODONE HCL 100 MG PO TABS
100.0000 mg | ORAL_TABLET | Freq: Every day | ORAL | Status: DC
  Administered 2011-08-06: 100 mg via ORAL
  Filled 2011-08-06 (×2): qty 1

## 2011-08-06 MED ORDER — AMPHETAMINE-DEXTROAMPHETAMINE 20 MG PO TABS: 20.0000 mg | ORAL_TABLET | Freq: Three times a day (TID) | ORAL | Status: DC

## 2011-08-06 NOTE — Interval H&P Note (Signed)
History and Physical Interval Note:  08/06/2011 8:44 AM  [name]  has presented today for surgery, with the diagnosis of aflutter  The various methods of treatment have been discussed with the patient and family. After consideration of risks, benefits and other options for treatment, the patient has consented to  Procedure(s) (LRB): ATRIAL FLUTTER ABLATION (N/A) as a surgical intervention .  The patient's history has been reviewed, patient examined, no change in status, stable for surgery.  I have reviewed the patients' chart and labs.  Questions were answered to the patient's satisfaction.     Sherryl Manges  STable interval  For RFCA aflutter today

## 2011-08-06 NOTE — H&P (View-Only) (Signed)
CARDIOLOGY CONSULT NOTE  Patient ID: [name] Browning, MRN: [id number], DOB/AGE: Jan 28, 1941 71 y.o. Admit date: (Not on file) Date of Consult: 07/16/2011  Primary Physician: Sandrea Hughs, MD, MD Primary Cardiologist:   Chief Complaint: Atrial flutter   HPI [name] Browning is a 71 y.o. male  :  Seen after the identification of asymptomatic atrial flutter. This was noted during a colonoscopy.  Thromboembolic risk factors are notable only for age so he has a CHADS-VASc score of 1.  An echocardiogram done earlier this year demonstrated normal left ventricular function and normal atrial size.    Past Medical History  Diagnosis Date  . ADD (attention deficit disorder)   . Hyperlipidemia   . DJD (degenerative joint disease) of knee   . Depression   . Anxiety   . Cataracts, bilateral   . Heart murmur   . Sleep apnea     could not tolerate cpap      Surgical History:  Past Surgical History  Procedure Date  . Wrist fracture surgery 2007    left     Home Meds: Prior to Admission medications   Medication Sig Start Date End Date Taking? Authorizing Provider  amphetamine-dextroamphetamine (ADDERALL) 20 MG tablet Take 20 mg by mouth 3 (three) times daily.   Yes Historical Provider, MD  ARIPiprazole (ABILIFY) 2 MG tablet Take 2 mg by mouth daily.   Yes Historical Provider, MD  Armodafinil (NUVIGIL) 250 MG tablet Take 250 mg by mouth daily.   Yes Historical Provider, MD  aspirin 325 MG EC tablet Take 325 mg by mouth daily.   Yes Historical Provider, MD  buPROPion (WELLBUTRIN XL) 150 MG 24 hr tablet Take 150 mg by mouth daily.   Yes Historical Provider, MD  clonazePAM (KLONOPIN) 0.5 MG tablet Take 0.5 mg by mouth 2 (two) times daily.   Yes Historical Provider, MD  Ferrous Gluconate (IRON) 240 (27 FE) MG TABS Take 240 mg by mouth daily.    Yes Historical Provider, MD  fish oil-omega-3 fatty acids 1000 MG capsule Take 1 g by mouth daily.   Yes Historical Provider, MD  simvastatin  (ZOCOR) 40 MG tablet Take 1 tablet (40 mg total) by mouth at bedtime. 06/08/11  Yes Nyoka Cowden, MD  Testosterone (ANDROGEL) 20.25 MG/1.25GM (1.62%) GEL Place 2 sprays onto the skin every morning.   Yes Historical Provider, MD  traZODone (DESYREL) 50 MG tablet Take 100 mg by mouth at bedtime.    Yes Historical Provider, MD  tretinoin (RETIN-A) 0.01 % gel Apply 1 application topically at bedtime.  06/08/11  Yes Historical Provider, MD  VIIBRYD 40 MG TABS Take 40 mg by mouth every morning.  06/07/11  Yes Historical Provider, MD    Inpatient Medications:    Allergies: No Known Allergies  History   Social History  . Marital Status: Married    Spouse Name: N/A    Number of Children: N/A  . Years of Education: N/A   Occupational History  . RetiredScientific laboratory technician for [employer]    Social History Main Topics  . Smoking status: Former Smoker    Types: Cigarettes    Quit date: 02/16/1968  . Smokeless tobacco: Not on file  . Alcohol Use: No  . Drug Use: Not on file  . Sexually Active: Not on file   Other Topics Concern  . Not on file   Social History Narrative  . No narrative on file     Family History  Problem  Relation Age of Onset  . Prostate cancer Father   . Heart disease Mother   . Leukemia Mother   . Colon cancer Neg Hx   . Stomach cancer Neg Hx      ROS:  Please see the history of present illness.    All other systems reviewed and negative.    Physical Exam:  Blood pressure 110/76, pulse 71, height 5' 7.5" (1.715 m), weight 145 lb 12.8 oz (66.134 kg). General: Well developed, well nourished male in no acute distress. Head: Normocephalic, atraumatic, sclera non-icteric, no xanthomas, nares are without discharge. Lymph Nodes:  none Neck: Negative for carotid bruits. JVD not elevated.flutter waves detected Lungs: Clear bilaterally to auscultation without wheezes, rales, or rhonchi. Breathing is unlabored. Heart: RRR with S1 S2. No murmurs, rubs, or gallops  appreciated. Abdomen: Soft, non-tender, non-distended with normoactive bowel sounds. No hepatomegaly. No rebound/guarding. No obvious abdominal masses. Msk:  Strength and tone appear normal for age. Extremities: No clubbing or cyanosis. No edema.  Distal pedal pulses are 2+ and equal bilaterally. Skin: Warm and Dry Neuro: Alert and oriented X 3. CN III-XII intact Grossly normal sensory and motor function . Psych:  Responds to questions appropriately with a normal affect.      Labs: CBC Lab Results  Component Value Date   WBC 6.2 06/18/2011   HGB 14.2 06/18/2011   HCT 42.5 06/18/2011   MCV 97.2 06/18/2011   PLT 255.0 06/18/2011   Lipids Lab Results  Component Value Date   CHOL 138 06/18/2011   HDL 51.20 06/18/2011   LDLCALC 64 06/18/2011   TRIG 112.0 06/18/2011   BNP Pro B Natriuretic peptide (BNP)  Date/Time Value Range Status  06/18/2011 11:15 AM 70.0  0.0-100.0 (pg/mL) Final  09/15/2009  9:14 AM 59.6  0.0-100.0 (pg/mL) Final   Miscellaneous No results found for this basename: DDIMER    Radiology/Studies:  Dg Chest 2 View  06/18/2011  *RADIOLOGY REPORT*  Clinical Data: Hypertension, atrial flutter  CHEST - 2 VIEW  Comparison: 04/15/08  Findings: Cardiomediastinal silhouette is stable.  Mild hyperinflation again noted.  No acute infiltrate or pleural effusion.  No pulmonary edema.  Stable mild degenerative changes thoracic spine.  IMPRESSION: No active disease.  Mild hyperinflation again noted.  Original Report Authenticated By: Natasha Mead, M.D.    EKG: Atrial flutter with 4:1 conduction   Assessment and Plan:  [name] Browning

## 2011-08-06 NOTE — CV Procedure (Signed)
[name] Browning [id number]  [id number]  Preop Dx: aflutter Postop Dx sinus rhtyhm   Procedure: EPS Arrhythmia mapping and RFCA    Dictation number  [id number]  Sherryl Manges, MD 08/06/2011 11:20 AM

## 2011-08-06 NOTE — Anesthesia Preprocedure Evaluation (Addendum)
Anesthesia Evaluation  Patient identified by MRN, date of birth, ID band Patient awake    Reviewed: Allergy & Precautions, H&P , NPO status , Patient's Chart, lab work & pertinent test results  History of Anesthesia Complications Negative for: history of anesthetic complications  Airway Mallampati: III TM Distance: >3 FB Neck ROM: Full    Dental  (+) Dental Advisory Given and Teeth Intact   Pulmonary sleep apnea ,  breath sounds clear to auscultation        Cardiovascular hypertension, + dysrhythmias Atrial Fibrillation Rhythm:Regular Rate:Normal   Left ventricle: The cavity size was normal. Wall thickness   was increased in a pattern of mild LVH. Systolic function   was vigorous. The estimated ejection fraction was in the   range of 65% to 70%. Wall motion was normal; there were no   regional wall motion abnormalities. The study is not   technically sufficient to allow evaluation of LV diastolic   function. - Mitral valve: Mild regurgitation. - Atrial septum: No defect or patent foramen ovale was   identified. Results for JHAMAL, PLUCINSKI (MRN [id number]) as of 08/06/2011 09:16  07/30/2011 13:23 WBC: 8.0 RBC: 4.18 (L) Hemoglobin: 13.7 HCT: 40.9 MCV: 98.0 MCHC: 33.5 RDW: 12.8 Platelets: 258.0    Neuro/Psych Anxiety Depression negative neurological ROS     GI/Hepatic negative GI ROS, Neg liver ROS,   Endo/Other  negative endocrine ROS  Renal/GU negative Renal ROSResults for KIANO, TERRIEN (MRN [id number]) as of 08/06/2011 09:16  07/30/2011 13:23 Sodium: 141 Potassium: 4.1 Chloride: 104 CO2: 29 BUN: 15 Creat: 1.4 Calcium: 8.5   negative genitourinary   Musculoskeletal negative musculoskeletal ROS (+)   Abdominal   Peds  Hematology negative hematology ROS (+)   Anesthesia Other Findings   Reproductive/Obstetrics                       Anesthesia Physical Anesthesia Plan  ASA:  III  Anesthesia Plan: General   Post-op Pain Management:    Induction: Intravenous  Airway Management Planned: LMA  Additional Equipment:   Intra-op Plan:   Post-operative Plan:   Informed Consent: I have reviewed the patients History and Physical, chart, labs and discussed the procedure including the risks, benefits and alternatives for the proposed anesthesia with the patient or authorized representative who has indicated his/her understanding and acceptance.   Dental advisory given  Plan Discussed with:   Anesthesia Plan Comments: (Asymptomatic A. Flutter  Plan GA with LMA)        Anesthesia Quick Evaluation

## 2011-08-06 NOTE — Preoperative (Signed)
Beta Blockers   Reason not to administer Beta Blockers:Not Applicable 

## 2011-08-06 NOTE — Interval H&P Note (Signed)
History and Physical Interval Note:  08/06/2011 10:12 AM  [name]  has presented today for surgery, with the diagnosis of aflutter  The various methods of treatment have been discussed with the patient and family. After consideration of risks, benefits and other options for treatment, the patient has consented to  Procedure(s) (LRB): ATRIAL FLUTTER ABLATION (N/A) as a surgical intervention .  The patient's history has been reviewed, patient examined, no change in status, stable for surgery.  I have reviewed the patients' chart and labs.  Questions were answered to the patient's satisfaction.     Sherryl Manges  Spoke with pt he has taken Rivaroxaban daily incl last pm

## 2011-08-06 NOTE — Anesthesia Postprocedure Evaluation (Signed)
  Anesthesia Post-op Note  Patient: [name] Browning  Procedure(s) Performed: Procedure(s) (LRB): ATRIAL FLUTTER ABLATION (N/A)  Patient Location: PACU  Anesthesia Type: General  Level of Consciousness: awake, alert  and oriented  Airway and Oxygen Therapy: Patient Spontanous Breathing  Post-op Pain: mild  Post-op Assessment: Post-op Vital signs reviewed and Patient's Cardiovascular Status Stable  Post-op Vital Signs: stable  Complications: No apparent anesthesia complications

## 2011-08-06 NOTE — Progress Notes (Deleted)
Patient c/o of some mild dyspnea Oxygen Sats stable in upper 90's. Dr.Jordan notified I will continue to monitor.

## 2011-08-06 NOTE — Transfer of Care (Signed)
Immediate Anesthesia Transfer of Care Note  Patient: [name] Browning  Procedure(s) Performed: Procedure(s) (LRB): ATRIAL FLUTTER ABLATION (N/A)  Patient Location: PACU  Anesthesia Type: General  Level of Consciousness: sedated  Airway & Oxygen Therapy: Patient Spontanous Breathing and Patient connected to nasal cannula oxygen  Post-op Assessment: Report given to PACU RN and Post -op Vital signs reviewed and stable  Post vital signs: Reviewed and stable  Complications: No apparent anesthesia complications

## 2011-08-06 NOTE — Op Note (Signed)
NAME:  [name] Browning, [name] Browning NO.:  [id number]  MEDICAL RECORD NO.:  [id number]  LOCATION:  [facility]                         FACILITY:  [facility]  PHYSICIAN:  Duke Salvia, MD, FACCDATE OF BIRTH:  1940/05/16  DATE OF PROCEDURE:  08/06/2011 DATE OF DISCHARGE:                              OPERATIVE REPORT   PREOPERATIVE DIAGNOSIS:  Atrial flutter.  POSTOPERATIVE DIAGNOSIS:  Atrial flutter.  PROCEDURE:  Electrophysiological study with arrhythmia mapping and catheter ablation.  Following we obtained informed consent, the patient was brought to electrophysiology laboratory and placed on the fluoroscopic table in supine position.  After routine prep and drape, the patient was sedated under general anesthesia.  Catheterization was performed with local anesthesia support.  Following the procedure, the catheters were removed.  Hemostasis was obtained.  The patient was sent from the holding area for sheath removal in a stable condition.  CATHETERS:  A 5-French quadripolar catheter was inserted via the left femoral vein to the AV junction and measured the His electrogram.  A 6-French octapolar catheter was inserted via the right femoral vein to the coronary sinus.  A 7-French duodecapolar catheter was inserted via the left femoral vein to mapping sites in the tricuspid anulus.  An 8-French 10-mm deflectable tip ablation catheter was inserted via an SAFL sheath to mapping sites in the posterior septal space.  Surface leads I, aVF, and V1 were monitored continuously throughout the procedure.  Following insertion of the catheters, a stimulation protocol included incremental atrial pacing.  Incremental ventricular pacing.  Single atrial extrastimuli paced cycle length of 700 msec.  END-TIDAL RESULTS:  End-tidal surface electrocardiogram. Initial rhythm:  Atrial flutter; RR interval 950 msec; QRS duration 79 msec; QTc interval 397 msec; AA interval 246 msec; HV interval 35  msec. Rhythm:  Sinus; RR interval 930 msec; PR interval 189 msec; P-wave duration 131 msec; QRS duration 98 msec; QT interval 409 msec; AH interval 220 msec; HV interval 38 msec.  AV nodal function: AV Wenckebach was 620 msec. VA conduction was associated. AV nodal effective refractory.  700 msec was 500 msec, and no evidence of discontinuity was noted.  ACCESSORY PATHWAY FUNCTION:  No evidence of accessory pathway was identified.  VENTRICULAR RESPONSE TO PROGRAMMED STIMULATION:  Normal for ventricular stimulation as described.  ARRHYTHMIA MAPPING:  Electrogram mapping demonstrated counter-clockwise rotation around the cavotricuspid isthmus.  RF energy; a total of 2 minutes and 50 seconds of RF energy was applied across the cavotricuspid isthmus with resultant interruption of tachycardia and generation of bidirectional isthmus block with a STEMI 1 interval 175 msec and an A1 A2 interval 125 msec.  Fluoroscopy time; a total of 3.3 minutes of fluoroscopy time was utilized at 15 frames per second.  IMPRESSION: 1. Sinus bradycardia. 2. Abnormal atrial function manifested by sustained atrial flutter. 3. Normal AV nodal function apart from prolonged AV Wenckebach cycle     length. 4. Normal His-Purkinje system function. 5. No accessory pathway. 6. Normal ventricular response to programmed stimulation.  SUMMARY CONCLUSION:  The results of electrophysiological testing demonstrated successful ablation of the cavotricuspid isthmus and the interruption of the substrate for the patient's atrial flutter.  The patient tolerated the procedure without apparent  complications.     Duke Salvia, MD, [facility]     SCK/MEDQ  D:  08/06/2011  T:  08/06/2011  Job:  [id number]

## 2011-08-07 ENCOUNTER — Other Ambulatory Visit: Payer: Self-pay

## 2011-08-07 DIAGNOSIS — I4892 Unspecified atrial flutter: Secondary | ICD-10-CM

## 2011-08-07 DIAGNOSIS — Z1211 Encounter for screening for malignant neoplasm of colon: Secondary | ICD-10-CM | POA: Diagnosis not present

## 2011-08-07 NOTE — Discharge Summary (Signed)
See progress notes [name] Browning  

## 2011-08-07 NOTE — Progress Notes (Signed)
@   Subjective:  Denies CP or dyspnea   Objective:  Filed Vitals:   08/06/11 0744 08/06/11 2015 08/07/11 0420  BP: 133/77  112/63  Pulse: 70  81  Temp: 96.9 F (36.1 C) 98 F (36.7 C) 97.8 F (36.6 C)  TempSrc: Oral Oral Oral  Resp: 18 16 18   Height: 5' 7.5" (1.715 m)    Weight: 65.318 kg (144 lb)    SpO2: 99%  95%    Intake/Output from previous day:  Intake/Output Summary (Last 24 hours) at 08/07/11 0913 Last data filed at 08/06/11 1700  Gross per 24 hour  Intake    900 ml  Output    320 ml  Net    580 ml    Physical Exam: Physical exam: Well-developed well-nourished in no acute distress.  Skin is warm and dry.  HEENT is normal.  Neck is supple.  Chest is clear to auscultation with normal expansion.  Cardiovascular exam is regular rate and rhythm.  Abdominal exam nontender or distended. No masses palpated. Right groin with no hematoma and no bruit Extremities show no edema. neuro grossly intact      Assessment/Plan:  #1-atrial flutter-status post ablation-continue xeralto for 4 weeks following procedure. Discontinue aspirin while on xeralto. #2-hyperlipidemia-continue statin. Followup Dr. Graciela Husbands in 4 weeks. D1  Olga Millers 08/07/2011, 9:13 AM

## 2011-08-07 NOTE — Discharge Summary (Signed)
Physician Discharge Summary  Patient ID: [name] Browning MRN: [id number] DOB/AGE: [birth year] 71 y.o.  Admit date: 08/06/2011 Discharge date: 08/07/2011  Primary Cardiologist: Sherryl Manges, MD   Primary Discharge Diagnosis:  1 Atrial Flutter  - s/p successful RFA, this admission  - CHADS-VASc score of 1  2 NL LV function   Secondary Discharge Diagnoses: Past Medical History  Diagnosis Date  . ADD (attention deficit disorder)   . Hyperlipidemia   . DJD (degenerative joint disease) of knee   . Depression   . Anxiety   . Cataracts, bilateral   . Heart murmur   . Sleep apnea     could not tolerate cpap    Reason for Admission: 71 year old male, recently diagnosed with asymptomatic atrial flutter, admitted for elective radiofrequency ablation therapy.  Procedures: Electrophysiological study with arrhythmia mapping and catheter ablation  Hospital Course: The results of electrophysiological testing demonstrated successful ablation of the cavotricuspid isthmus and the interruption of the substrate for the patient's atrial flutter. The patient tolerated the procedure without apparent complications.   Final recommendations at time of discharge, per Dr. Jens Som, were to discontinue aspirin and continue Xarelto for 4 weeks, as previously outlined.   Discharge Vitals: Blood pressure 112/63, pulse 81, temperature 97.8 F (36.6 C), temperature source Oral, resp. rate 18, height 5' 7.5" (1.715 m), weight 144 lb (65.318 kg), SpO2 95.00%.  Labs: Lab Results  Component Value Date   WBC 8.0 07/30/2011   HGB 13.7 07/30/2011   HCT 40.9 07/30/2011   MCV 98.0 07/30/2011   PLT 258.0 07/30/2011     No results found for this basename: NA,K,CL,CO2,BUN,CREATININE,CALCIUM,ALBUMIN,PROT,BILITOT,ALKPHOS,ALT,AST,GLUCOSE in the last 168 hours  Lab Results  Component Value Date   CHOL 138 06/18/2011   HDL 51.20 06/18/2011   LDLCALC 64 06/18/2011   TRIG 112.0 06/18/2011    No results found for this  basename: DDIMER    Lab Results  Component Value Date   TSH 2.20 06/18/2011    No results found for this basename: CKTOTAL:3,CKMB:3,TROPONINI:3 in the last 72 hours  Diagnostic Studies: No results found.   DISPOSITION: Stable condition  FOLLOW UP PLANS AND APPOINTMENTS: Discharge Orders    Future Orders Please Complete By Expires   Diet - low sodium heart healthy      Increase activity slowly        Follow-up Information    Follow up with Sherryl Manges, MD in 4 weeks. (office to call and arrange)    Contact information:   [address] [place] [address] [phone]          DISCHARGE MEDICATIONS: Medication List  As of 08/07/2011  9:18 AM   STOP taking these medications         aspirin 325 MG EC tablet         TAKE these medications         amphetamine-dextroamphetamine 20 MG tablet   Commonly known as: ADDERALL   Take 20 mg by mouth 3 (three) times daily.      ANDROGEL 20.25 MG/1.25GM (1.62%) Gel   Generic drug: Testosterone   Place 2 sprays onto the skin every morning.      ARIPiprazole 2 MG tablet   Commonly known as: ABILIFY   Take 2 mg by mouth daily.      buPROPion 150 MG 24 hr tablet   Commonly known as: WELLBUTRIN XL   Take 150 mg by mouth daily.  clonazePAM 0.5 MG tablet   Commonly known as: KLONOPIN   Take 0.5 mg by mouth 2 (two) times daily.      fish oil-omega-3 fatty acids 1000 MG capsule   Take 1 g by mouth daily.      Iron 240 (27 FE) MG Tabs   Take 240 mg by mouth daily.      NUVIGIL 250 MG tablet   Generic drug: Armodafinil   Take 250 mg by mouth daily.      Rivaroxaban 20 MG Tabs   Take 20 mg by mouth daily.      simvastatin 40 MG tablet   Commonly known as: ZOCOR   Take 1 tablet (40 mg total) by mouth at bedtime.      traZODone 50 MG tablet   Commonly known as: DESYREL   Take 100 mg by mouth at bedtime.      tretinoin 0.01 % gel   Commonly known as: RETIN-A     Apply 1 application topically at bedtime.      VIIBRYD 40 MG Tabs   Generic drug: Vilazodone HCl   Take 40 mg by mouth every morning.            BRING ALL MEDICATIONS WITH YOU TO FOLLOW UP APPOINTMENTS  Time spent with patient to include physician time: Greater than 30 minutes, including physician time.  SignedPrescott Parma 08/07/2011, 9:18 AM Co-Sign MD

## 2011-08-07 NOTE — Progress Notes (Signed)
Patient ambulated hall tolerated well denies pain bilateral groin sites level 0. Stable on monitor.p

## 2011-08-09 ENCOUNTER — Telehealth: Payer: Self-pay | Admitting: Internal Medicine

## 2011-08-09 NOTE — Telephone Encounter (Signed)
Pt wife calls today b/c he ran out of xarelto 20mg  this weekend and the prior authorization had not gone through. Wife stated they had to pay $25.00 per pill for Saturday and Sunday. Wife will bring in information for [facility]. Samples of Xarelto 20mg  placed at desk. Wife will pick up today. Will also forward to [employer]. Mylo Red RN

## 2011-08-09 NOTE — Telephone Encounter (Signed)
New msg Pt's wife called about prior auth for xarelto . Please send to [facility] on [address]

## 2011-08-10 NOTE — Telephone Encounter (Signed)
PA done through the patient's insurance. DX- Atrial flutter- approved for 1 month (through 09/14/11). I have left a message with the patient's pharmacy.

## 2011-08-11 ENCOUNTER — Other Ambulatory Visit: Payer: Self-pay | Admitting: Internal Medicine

## 2011-08-11 DIAGNOSIS — I4892 Unspecified atrial flutter: Secondary | ICD-10-CM

## 2011-08-11 MED ORDER — RIVAROXABAN 20 MG PO TABS: 20.0000 mg | ORAL_TABLET | Freq: Every day | ORAL | Status: DC

## 2011-08-12 ENCOUNTER — Telehealth: Payer: Self-pay | Admitting: Internal Medicine

## 2011-08-12 NOTE — Telephone Encounter (Signed)
New msg Pt's wife was calling back about paperwork that was left for Dr Graciela Husbands to fill out. Please call

## 2011-08-12 NOTE — Telephone Encounter (Signed)
Spoke with wife  And answered their questions   Continue Rivaroxaban x 3 weeks Resume asa 81 when  Rivaroxaban stops

## 2011-08-12 NOTE — Telephone Encounter (Signed)
I spoke with the patient's wife. She was calling about a list of questions that was left for Dr. Graciela Husbands to review. I explained to her that I have these pulled for him to look at and that I hope he will be able to do this today. We will call her back after this is done. She is agreeable.

## 2011-09-09 ENCOUNTER — Encounter: Payer: Self-pay | Admitting: Nurse Practitioner

## 2011-09-09 ENCOUNTER — Ambulatory Visit (INDEPENDENT_AMBULATORY_CARE_PROVIDER_SITE_OTHER): Payer: Medicare Other | Admitting: Nurse Practitioner

## 2011-09-09 VITALS — BP 130/64 | HR 70 | Ht 67.5 in | Wt 147.0 lb

## 2011-09-09 DIAGNOSIS — I4892 Unspecified atrial flutter: Secondary | ICD-10-CM | POA: Diagnosis not present

## 2011-09-09 DIAGNOSIS — I499 Cardiac arrhythmia, unspecified: Secondary | ICD-10-CM | POA: Diagnosis not present

## 2011-09-09 MED ORDER — ASPIRIN EC 81 MG PO TBEC: 81.0000 mg | DELAYED_RELEASE_TABLET | Freq: Every day | ORAL | Status: DC

## 2011-09-09 NOTE — Patient Instructions (Addendum)
Your physician has recommended you make the following change in your medication: STOP XARELTO.  START ASPIRIN 81 MG, take one tablet every day  Your physician recommends that you schedule a follow-up appointment in: 6 months with Dr. Graciela Husbands

## 2011-09-09 NOTE — Progress Notes (Signed)
Patient Name: [name] Browning Date of Encounter: 09/09/2011  Primary Care Provider:  Sandrea Hughs, MD Primary Cardiologist:  Odessa Fleming, MD  Patient Profile  71 y/o male s/p recent aflutter ablation who presents for f/u.  Problem List   Past Medical History  Diagnosis Date  . ADD (attention deficit disorder)   . Hyperlipidemia   . DJD (degenerative joint disease) of knee   . Depression   . Anxiety   . Cataracts, bilateral   . Heart murmur     a. 06/2011 Echo: 65-70%, mild MR  . Sleep apnea     could not tolerate cpap  . Atrial flutter     a. s/p RFCA 07/2011.  Xarelto d/c 08/2011.  . S/P cardiac cath     a. in 1990's - Dr. Allyson Sabal - WNL   Past Surgical History  Procedure Date  . Wrist fracture surgery 2007    left    Allergies  No Known Allergies  HPI  71 y/o male with the above problem list.  Earlier this year, he was Dx with asymptomatic, rate-controlled, atrial flutter.  He was seen by Dr. Graciela Husbands in June and was placed on Xarelto and arrangements were made for TEE and RFCA.  This was successfully performed on 6/21.  Since, pt has done well.  He's had no palpitations, pnd, orthopnea, n, v, dizziness, syncope, edema, or early satiety.  He is tolerating xarelto well but does wish to come off of it.  Home Medications  Prior to Admission medications   Medication Sig Start Date End Date Taking? Authorizing Provider  amphetamine-dextroamphetamine (ADDERALL) 20 MG tablet Take 20 mg by mouth 3 (three) times daily.   Yes Historical Provider, MD  ARIPiprazole (ABILIFY) 2 MG tablet Take 2 mg by mouth daily.   Yes Historical Provider, MD  Armodafinil (NUVIGIL) 250 MG tablet Take 250 mg by mouth daily.   Yes Historical Provider, MD  buPROPion (WELLBUTRIN XL) 150 MG 24 hr tablet Take 150 mg by mouth daily.   Yes Historical Provider, MD  clonazePAM (KLONOPIN) 0.5 MG tablet Take 0.5 mg by mouth 2 (two) times daily.   Yes Historical Provider, MD  Ferrous Gluconate (IRON) 240 (27 FE)  MG TABS Take 240 mg by mouth daily.    Yes Historical Provider, MD  fish oil-omega-3 fatty acids 1000 MG capsule Take 1 g by mouth daily.   Yes Historical Provider, MD  simvastatin (ZOCOR) 40 MG tablet Take 1 tablet (40 mg total) by mouth at bedtime. 06/08/11  Yes Nyoka Cowden, MD  Testosterone (ANDROGEL) 20.25 MG/1.25GM (1.62%) GEL Place 2 sprays onto the skin every morning.   Yes Historical Provider, MD  traZODone (DESYREL) 50 MG tablet Take 100 mg by mouth at bedtime.    Yes Historical Provider, MD  tretinoin (RETIN-A) 0.01 % gel Apply 1 application topically at bedtime.  06/08/11  Yes Historical Provider, MD  VIIBRYD 40 MG TABS Take 40 mg by mouth every morning.  06/07/11  Yes Historical Provider, MD  aspirin EC 81 MG tablet Take 1 tablet (81 mg total) by mouth daily. 09/09/11 09/08/12  Cassell Clement, MD    Review of Systems  Doing well as above.  No chest pain, sob, n, v, dizziness, syncope, edema, early satiety, dysuria, dark stools, blood in stools, diarrhea, rash/skin changes, fevers, chills, wt loss/gain.  Otherwise all systems reviewed and negative.  Physical Exam  Blood pressure 130/64, pulse 70, height 5' 7.5" (1.715 m), weight 147 lb (66.679  kg).  General: Pleasant, NAD Psych: Normal affect. Neuro: Alert and oriented X 3. Moves all extremities spontaneously. HEENT: Normal  Neck: Supple without bruits or JVD. Lungs:  Resp regular and unlabored, CTA. Heart: RRR no s3, s4, or murmurs. Abdomen: Soft, non-tender, non-distended, BS + x 4.  Extremities: No clubbing, cyanosis or edema. DP/PT/Radials 2+ and equal bilaterally.  Accessory Clinical Findings  ECG - rsr, pac's, 70, no acute st/t changes.  Assessment & Plan  1.  Aflutter:  S/p RFCA.  In sinus today.  Will d/c xarelto as previously planned as it has been > 1 month.  Add Asa 81mg  Daily. Taught pt how to take his radial pulse and advised that he do this daily to assess HR and regularity.  F/u Dr. Graciela Husbands in 6  months.  Nicolasa Ducking, NP 09/09/2011, 11:46 AM

## 2011-09-13 ENCOUNTER — Encounter: Payer: Self-pay | Admitting: Internal Medicine

## 2012-01-06 DIAGNOSIS — Z23 Encounter for immunization: Secondary | ICD-10-CM | POA: Diagnosis not present

## 2012-02-06 ENCOUNTER — Other Ambulatory Visit: Payer: Self-pay | Admitting: Internal Medicine

## 2012-02-23 ENCOUNTER — Other Ambulatory Visit: Payer: Self-pay | Admitting: Gastroenterology

## 2012-02-25 ENCOUNTER — Ambulatory Visit: Payer: Medicare Other | Admitting: Internal Medicine

## 2012-03-01 ENCOUNTER — Ambulatory Visit (INDEPENDENT_AMBULATORY_CARE_PROVIDER_SITE_OTHER): Payer: Medicare Other | Admitting: Internal Medicine

## 2012-03-01 ENCOUNTER — Ambulatory Visit: Payer: Medicare Other | Admitting: Internal Medicine

## 2012-03-01 ENCOUNTER — Encounter: Payer: Self-pay | Admitting: Internal Medicine

## 2012-03-01 VITALS — BP 118/64 | HR 63 | Ht 67.0 in | Wt 152.0 lb

## 2012-03-01 DIAGNOSIS — F329 Major depressive disorder, single episode, unspecified: Secondary | ICD-10-CM

## 2012-03-01 DIAGNOSIS — I4892 Unspecified atrial flutter: Secondary | ICD-10-CM

## 2012-03-01 DIAGNOSIS — F32A Depression, unspecified: Secondary | ICD-10-CM | POA: Insufficient documentation

## 2012-03-01 DIAGNOSIS — F3289 Other specified depressive episodes: Secondary | ICD-10-CM | POA: Diagnosis not present

## 2012-03-01 NOTE — Assessment & Plan Note (Signed)
Status post ablation without clinical recurrences. I will have suggested he stop his aspirin.

## 2012-03-01 NOTE — Assessment & Plan Note (Signed)
The patient has a significant history of depression in the past and has a tendency towards melancholy and feels that thisis currently descriptive of his stay. He is scheduled to see his psychiatrist future and I've encouraged him in this regard to query his friends and family as to whether he is more depressed

## 2012-03-01 NOTE — Progress Notes (Signed)
Patient Care Team: Nyoka Cowden, MD as PCP - General (Pulmonary Disease)   HPI  [name] Browning is a 72 y.o. male Seen in followup following atrial flutter catheter ablation 6/13  He is encouraged palpitations. He's had no significant chest pain or shortness of breath. He admits to a significant amount of psychosocial stress related to writing. He acknowledges a depressive personality but denies suicidal ideation  Past Medical History  Diagnosis Date  . ADD (attention deficit disorder)   . Hyperlipidemia   . DJD (degenerative joint disease) of knee   . Depression   . Anxiety   . Cataracts, bilateral   . Heart murmur     a. 06/2011 Echo: 65-70%, mild MR  . Sleep apnea     could not tolerate cpap  . Atrial flutter     a. s/p RFCA 07/2011.  Xarelto d/c 08/2011.  . S/P cardiac cath     a. in 1990's - Dr. Allyson Sabal - WNL    Past Surgical History  Procedure Date  . Wrist fracture surgery 2007    left    Current Outpatient Prescriptions  Medication Sig Dispense Refill  . amphetamine-dextroamphetamine (ADDERALL) 20 MG tablet Take 20 mg by mouth 3 (three) times daily.      . ARIPiprazole (ABILIFY) 2 MG tablet Take 2 mg by mouth daily.      [name] aspirin EC 81 MG tablet Take 1 tablet (81 mg total) by mouth daily.  1 tablet  0  . clonazePAM (KLONOPIN) 0.5 MG tablet Take 0.5 mg by mouth 2 (two) times daily.      . Ferrous Gluconate (IRON) 240 (27 FE) MG TABS Take 240 mg by mouth daily.       . fish oil-omega-3 fatty acids 1000 MG capsule Take 1 g by mouth daily.      . simvastatin (ZOCOR) 40 MG tablet TAKE 1 TABLET BY MOUTH AT BEDTIME  30 tablet  5  . Testosterone (ANDROGEL) 20.25 MG/1.25GM (1.62%) GEL Place 2 sprays onto the skin every morning.      . traZODone (DESYREL) 50 MG tablet Take 100 mg by mouth at bedtime.       . tretinoin (RETIN-A) 0.01 % gel Apply 1 application topically at bedtime.       [name] VIIBRYD 40 MG TABS Take 40 mg by mouth every morning.         No Known  Allergies  Review of Systems negative except from HPI and PMH  Physical Exam BP 118/64  Pulse 63  Ht 5\' 7"  (1.702 m)  Wt 152 lb (68.947 kg)  BMI 23.81 kg/m2  SpO2 98% Well developed and nourished in no acute distress HENT normal Neck supple with JVP-flat Clear Regular rate and rhythm, no murmurs or gallops Abd-soft with active BS No Clubbing cyanosis edema Skin-warm and dry A & Oriented  Grossly normal sensory and motor function Affect somewhat flat  ECG demonstrates sinus at 63 intervals 19/09/40 and axis XCII    Assessment and  Plan

## 2012-05-12 DIAGNOSIS — L821 Other seborrheic keratosis: Secondary | ICD-10-CM | POA: Diagnosis not present

## 2012-05-12 DIAGNOSIS — L819 Disorder of pigmentation, unspecified: Secondary | ICD-10-CM | POA: Diagnosis not present

## 2012-05-12 DIAGNOSIS — D1801 Hemangioma of skin and subcutaneous tissue: Secondary | ICD-10-CM | POA: Diagnosis not present

## 2012-07-06 DIAGNOSIS — N138 Other obstructive and reflux uropathy: Secondary | ICD-10-CM | POA: Diagnosis not present

## 2012-07-06 DIAGNOSIS — E291 Testicular hypofunction: Secondary | ICD-10-CM | POA: Diagnosis not present

## 2012-07-06 DIAGNOSIS — N401 Enlarged prostate with lower urinary tract symptoms: Secondary | ICD-10-CM | POA: Diagnosis not present

## 2012-07-07 ENCOUNTER — Other Ambulatory Visit: Payer: Self-pay | Admitting: Internal Medicine

## 2012-07-11 DIAGNOSIS — N401 Enlarged prostate with lower urinary tract symptoms: Secondary | ICD-10-CM | POA: Diagnosis not present

## 2012-07-11 DIAGNOSIS — E291 Testicular hypofunction: Secondary | ICD-10-CM | POA: Diagnosis not present

## 2012-07-11 DIAGNOSIS — N138 Other obstructive and reflux uropathy: Secondary | ICD-10-CM | POA: Diagnosis not present

## 2012-07-31 ENCOUNTER — Encounter: Payer: Medicare Other | Admitting: Internal Medicine

## 2012-08-07 DIAGNOSIS — Z961 Presence of intraocular lens: Secondary | ICD-10-CM | POA: Diagnosis not present

## 2012-08-07 DIAGNOSIS — H2510 Age-related nuclear cataract, unspecified eye: Secondary | ICD-10-CM | POA: Diagnosis not present

## 2012-08-07 DIAGNOSIS — H5210 Myopia, unspecified eye: Secondary | ICD-10-CM | POA: Diagnosis not present

## 2012-08-07 DIAGNOSIS — H26019 Infantile and juvenile cortical, lamellar, or zonular cataract, unspecified eye: Secondary | ICD-10-CM | POA: Diagnosis not present

## 2012-09-15 ENCOUNTER — Other Ambulatory Visit: Payer: Self-pay | Admitting: Internal Medicine

## 2012-09-19 ENCOUNTER — Telehealth: Payer: Self-pay | Admitting: Internal Medicine

## 2012-09-19 NOTE — Telephone Encounter (Signed)
ATC patient, no answer [id number] 

## 2012-09-20 NOTE — Telephone Encounter (Signed)
[email or web address]  Look like simvastatin rx was sent to [facility] on 09/15/12 for #30 x 0 with instructions that pt needs OV for further refills.

## 2012-09-20 NOTE — Telephone Encounter (Signed)
Pt's spouse is returning call & asks to be reached at [phone].  [name] Browning

## 2012-09-20 NOTE — Telephone Encounter (Signed)
Called [facility], spoke with Misty Stanley.  Was advised they did receive simvastatin rx on 8.1 and rx is ready for pick up.    -----  Called, spoke with pt's spouse.  Informed her of above.  She verbalized understanding.  Pt has a pending OV with MW already on 8/12.  Wife is aware to have pt keep this appt for further refills.

## 2012-09-26 ENCOUNTER — Ambulatory Visit (INDEPENDENT_AMBULATORY_CARE_PROVIDER_SITE_OTHER): Payer: Medicare Other | Admitting: Internal Medicine

## 2012-09-26 ENCOUNTER — Other Ambulatory Visit (INDEPENDENT_AMBULATORY_CARE_PROVIDER_SITE_OTHER): Payer: Medicare Other

## 2012-09-26 ENCOUNTER — Encounter: Payer: Self-pay | Admitting: Internal Medicine

## 2012-09-26 VITALS — BP 130/90 | HR 79 | Temp 97.7°F | Ht 66.0 in

## 2012-09-26 DIAGNOSIS — I4892 Unspecified atrial flutter: Secondary | ICD-10-CM

## 2012-09-26 DIAGNOSIS — I10 Essential (primary) hypertension: Secondary | ICD-10-CM

## 2012-09-26 DIAGNOSIS — E785 Hyperlipidemia, unspecified: Secondary | ICD-10-CM | POA: Diagnosis not present

## 2012-09-26 DIAGNOSIS — Z8719 Personal history of other diseases of the digestive system: Secondary | ICD-10-CM

## 2012-09-26 DIAGNOSIS — R42 Dizziness and giddiness: Secondary | ICD-10-CM | POA: Diagnosis not present

## 2012-09-26 DIAGNOSIS — E291 Testicular hypofunction: Secondary | ICD-10-CM

## 2012-09-26 HISTORY — DX: Personal history of other diseases of the digestive system: Z87.19

## 2012-09-26 LAB — CBC WITH DIFFERENTIAL/PLATELET
Basophils Relative: 0.4 % (ref 0.0–3.0)
Eosinophils Relative: 3 % (ref 0.0–5.0)
HCT: 42.4 % (ref 39.0–52.0)
Lymphs Abs: 1.8 10*3/uL (ref 0.7–4.0)
MCV: 94.3 fl (ref 78.0–100.0)
Monocytes Absolute: 0.7 10*3/uL (ref 0.1–1.0)
Neutrophils Relative %: 59.5 % (ref 43.0–77.0)
RBC: 4.49 Mil/uL (ref 4.22–5.81)
WBC: 6.8 10*3/uL (ref 4.5–10.5)

## 2012-09-26 LAB — HEPATIC FUNCTION PANEL
ALT: 23 U/L (ref 0–53)
Total Bilirubin: 0.8 mg/dL (ref 0.3–1.2)
Total Protein: 7.4 g/dL (ref 6.0–8.3)

## 2012-09-26 LAB — BASIC METABOLIC PANEL
Chloride: 102 mEq/L (ref 96–112)
Potassium: 4.6 mEq/L (ref 3.5–5.1)

## 2012-09-26 LAB — LIPID PANEL
Cholesterol: 158 mg/dL (ref 0–200)
HDL: 43.3 mg/dL (ref >39.00)
LDL Cholesterol: 93 mg/dL (ref 0–99)
VLDL: 21.8 mg/dL (ref 0.0–40.0)

## 2012-09-26 LAB — TSH: TSH: 2.31 u[IU]/mL (ref 0.35–5.50)

## 2012-09-26 MED ORDER — ASPIRIN EC 81 MG PO TBEC: 81.0000 mg | DELAYED_RELEASE_TABLET | Freq: Every day | ORAL | Status: DC

## 2012-09-26 MED ORDER — ATENOLOL 25 MG PO TABS: 25.0000 mg | ORAL_TABLET | Freq: Every day | ORAL | Status: DC

## 2012-09-26 NOTE — Assessment & Plan Note (Signed)
-   Target LDL < 130 male, type A, pos fm hx   Lab Results  Component Value Date   CHOL 158 09/26/2012   HDL 43.30 09/26/2012   LDLCALC 93 09/26/2012   TRIG 109.0 09/26/2012   CHOLHDL 4 09/26/2012     Adequate control on present rx, reviewed > no change in rx needed

## 2012-09-26 NOTE — Assessment & Plan Note (Signed)
F/u urology on testosterone gel   Rectal exams/ prostate screens per urology

## 2012-09-26 NOTE — Progress Notes (Signed)
Subjective:     Patient ID: [name] Browning, male   DOB: 23-Oct-1940   MRN: [id number]  HPI  24  yowm with history of hyperlipidemia, allergic rhinitis , and depression.   03/2007--presented for CPX-hx of an abnormal EKG leading to a left heart catheter in 1994 that was felt to be completely normal and therefore felt to represent a false positive EKG, still able to exercise up to 5 times weekly including intense cross-country running.   Jun 25, 2009 cc need refill ofor simvastatin, no cp tia or claudicaiton. no change   September 15, 2009 cpx no c/o's   April 30, 2010 ov cc fatigue can only sleep with meds and stopped meds due to article in paper  06/08/2011 f/u ov/Maylie Ashton cc here for f/u hyperlipidemia but not fasting. No longer running due to joint problems, no ex cp, tia or claudication symptoms rec cpx due   06/18/2011 f/u ov/Jagjit Riner cc aflutter noted at @ colonoscopy  06/14/11 and pt completely asym x "a little more fatigued" s palp, cp, sob, presyncope.  Denies excess caffeine or decongestant use Imp RAF rec Increase aspirin to 325 mg one daily but take the coated type> cards eval    08/06/11 - s/p successful RFA   - CHADS-VASc score of 1    09/26/2012  ov/Benjiman Sedgwick  Comprehensive eval / hyperlipidemia. Chief Complaint  Patient presents with  . Annual Exam    pt denies any concerns at this time    No longer running due to knees, less writing also No sob, palpitations, cough, tia, claudication  Sleeping ok without nocturnal  or early am exacerbation  of respiratory  c/o's  . Also denies any obvious fluctuation of symptoms with weather or environmental changes or other aggravating or alleviating factors except as outlined above     Current Medications, Allergies, Past Medical History, Past Surgical History, Family History, and Social History were reviewed in [employer] record.  ROS  The following are not active complaints unless bolded sore throat, dysphagia,  dental problems, itching, sneezing,  nasal congestion or excess/ purulent secretions, ear ache,   fever, chills, sweats, unintended wt loss, pleuritic or exertional cp, hemoptysis,  orthopnea pnd or leg swelling, presyncope, palpitations, heartburn, abdominal pain, anorexia, nausea, vomiting, diarrhea  or change in bowel or urinary habits, change in stools or urine, dysuria,hematuria,  rash, arthralgias, visual complaints, headache, numbness weakness or ataxia or problems with walking or coordination,  change in mood/affect or memory.                          Past Medical History:  Adult ADD........................................................................................Marland KitchenKaur  Hyperlipidemia  - Target LDL < 130 male, type A, pos fm hx  hemorrhoidal bleeding.....................................................................Marland KitchenPatterson  - colonoscopy 05/22/2001  And 06/14/11 sleep apnea seat CPAP titration to 10 recommended 10/08/2002 for a baseline RDI of 33  - stopped 2007  Testerone def....................................................................................[name] [facility] Maintenance............................................................................Marland KitchenWert  - DT 05/2011  - Pneumovax April 15, 2008  - CPX 09/26/2012  DJD Left Knee....................................................................................Marland KitchenGioffree  New A flutter 06/18/2011 ........................................................................[name] [place] [facility]          Objective:   Physical Exam GEN: A/Ox3; pleasant , NAD, slightly anxious  wt 153 February 07, 2008 > > 147 September 15, 2009 > 151 April 30, 2010 > 152 06/08/2011 > 06/18/2011  149> 09/26/2012  146  HEENT: [address]/AT, , EACs-clear, TMs-wnl, NOSE-clear, THROAT-clear  NECK: Supple w/ fair ROM; no JVD; normal carotid impulses  w/o bruits; no thyromegaly or nodules palpated; no lymphadenopathy.  CHEST clear to a and P bilaterally   HEART: RRR, no m/r/g  Pulse 70 and perfectly regular ABDOMEN: Soft & nt; nml bowel sounds; no organomegaly or masses detected.  EXT: Warm bil, no calf pain, edema, clubbing, pulses ok x L DP reduced GU / rectal per urology/ on testosterone gl MS nl gait, no deformity Neuro nl sensorium, no tremors, no motor or cerebellar deficitis        Assessment:

## 2012-09-26 NOTE — Assessment & Plan Note (Signed)
-   Left Heart Cath nl 1994 ([facility]) x LVEDP 17     - Onset ? (asymptomatic    - TSH 06/18/2011 > nl as is BNP -08/06/11 - s/p successful RFA, this admission  - CHADS-VASc score of 1  No evidence of recurrence

## 2012-09-26 NOTE — Assessment & Plan Note (Signed)
Not Adequate control on present rx, reviewed options > add atenolol 25 mg daily

## 2012-09-26 NOTE — Patient Instructions (Addendum)
Atenolol 25 mg daily and only use the lorazepam as needed  Continue baby aspirin daily and stop your iron tablets  Please remember to go to the lab  department downstairs for your tests - we will call you with the results when they are available.  Please schedule a follow up visit in 3 months but call sooner if needed

## 2012-09-27 ENCOUNTER — Telehealth: Payer: Self-pay | Admitting: Internal Medicine

## 2012-09-27 NOTE — Progress Notes (Signed)
Quick Note:  Spoke with pt and notified of results per Dr. Wert. Pt verbalized understanding and denied any questions.  ______ 

## 2012-09-27 NOTE — Telephone Encounter (Signed)
Spoke with wife; verbally went over lab results with her as patient signed DPR for [place] to speak with wife. Nothing more needed at this time.

## 2013-01-18 DIAGNOSIS — H10509 Unspecified blepharoconjunctivitis, unspecified eye: Secondary | ICD-10-CM | POA: Diagnosis not present

## 2013-01-18 DIAGNOSIS — N138 Other obstructive and reflux uropathy: Secondary | ICD-10-CM | POA: Diagnosis not present

## 2013-01-18 DIAGNOSIS — N139 Obstructive and reflux uropathy, unspecified: Secondary | ICD-10-CM | POA: Diagnosis not present

## 2013-01-18 DIAGNOSIS — H2510 Age-related nuclear cataract, unspecified eye: Secondary | ICD-10-CM | POA: Diagnosis not present

## 2013-01-18 DIAGNOSIS — N401 Enlarged prostate with lower urinary tract symptoms: Secondary | ICD-10-CM | POA: Diagnosis not present

## 2013-01-18 DIAGNOSIS — IMO0002 Reserved for concepts with insufficient information to code with codable children: Secondary | ICD-10-CM | POA: Diagnosis not present

## 2013-01-18 DIAGNOSIS — H26499 Other secondary cataract, unspecified eye: Secondary | ICD-10-CM | POA: Diagnosis not present

## 2013-01-18 DIAGNOSIS — E291 Testicular hypofunction: Secondary | ICD-10-CM | POA: Diagnosis not present

## 2013-01-22 DIAGNOSIS — H10509 Unspecified blepharoconjunctivitis, unspecified eye: Secondary | ICD-10-CM | POA: Diagnosis not present

## 2013-01-23 DIAGNOSIS — Z23 Encounter for immunization: Secondary | ICD-10-CM | POA: Diagnosis not present

## 2013-01-24 DIAGNOSIS — N529 Male erectile dysfunction, unspecified: Secondary | ICD-10-CM | POA: Diagnosis not present

## 2013-01-24 DIAGNOSIS — N139 Obstructive and reflux uropathy, unspecified: Secondary | ICD-10-CM | POA: Diagnosis not present

## 2013-01-24 DIAGNOSIS — N401 Enlarged prostate with lower urinary tract symptoms: Secondary | ICD-10-CM | POA: Diagnosis not present

## 2013-01-24 DIAGNOSIS — E291 Testicular hypofunction: Secondary | ICD-10-CM | POA: Diagnosis not present

## 2013-01-24 DIAGNOSIS — N138 Other obstructive and reflux uropathy: Secondary | ICD-10-CM | POA: Diagnosis not present

## 2013-01-31 DIAGNOSIS — H2510 Age-related nuclear cataract, unspecified eye: Secondary | ICD-10-CM | POA: Diagnosis not present

## 2013-01-31 DIAGNOSIS — IMO0002 Reserved for concepts with insufficient information to code with codable children: Secondary | ICD-10-CM | POA: Diagnosis not present

## 2013-02-21 DIAGNOSIS — H26499 Other secondary cataract, unspecified eye: Secondary | ICD-10-CM | POA: Diagnosis not present

## 2013-02-22 ENCOUNTER — Telehealth: Payer: Self-pay | Admitting: Internal Medicine

## 2013-02-22 MED ORDER — SIMVASTATIN 40 MG PO TABS: 40.0000 mg | ORAL_TABLET | Freq: Every day | ORAL | Status: DC

## 2013-02-22 NOTE — Telephone Encounter (Signed)
Rx has been sent in. Annual has been scheduled for 02/27/13.

## 2013-02-27 ENCOUNTER — Encounter: Payer: Self-pay | Admitting: Internal Medicine

## 2013-02-27 ENCOUNTER — Ambulatory Visit (INDEPENDENT_AMBULATORY_CARE_PROVIDER_SITE_OTHER): Payer: Medicare Other | Admitting: Internal Medicine

## 2013-02-27 ENCOUNTER — Other Ambulatory Visit (INDEPENDENT_AMBULATORY_CARE_PROVIDER_SITE_OTHER): Payer: Medicare Other

## 2013-02-27 ENCOUNTER — Ambulatory Visit (INDEPENDENT_AMBULATORY_CARE_PROVIDER_SITE_OTHER)
Admission: RE | Admit: 2013-02-27 | Discharge: 2013-02-27 | Disposition: A | Discharge status: Home / Self Care | Payer: Medicare Other | Source: Ambulatory Visit | Attending: Internal Medicine | Admitting: Internal Medicine

## 2013-02-27 VITALS — BP 120/68 | HR 58 | Temp 98.7°F | Ht 67.0 in | Wt 148.4 lb

## 2013-02-27 DIAGNOSIS — E785 Hyperlipidemia, unspecified: Secondary | ICD-10-CM

## 2013-02-27 DIAGNOSIS — Z8719 Personal history of other diseases of the digestive system: Secondary | ICD-10-CM | POA: Diagnosis not present

## 2013-02-27 DIAGNOSIS — Z0000 Encounter for general adult medical examination without abnormal findings: Secondary | ICD-10-CM

## 2013-02-27 DIAGNOSIS — I10 Essential (primary) hypertension: Secondary | ICD-10-CM

## 2013-02-27 DIAGNOSIS — I4892 Unspecified atrial flutter: Secondary | ICD-10-CM

## 2013-02-27 LAB — CBC WITH DIFFERENTIAL/PLATELET
BASOS PCT: 0.4 % (ref 0.0–3.0)
Basophils Absolute: 0 10*3/uL (ref 0.0–0.1)
Eosinophils Absolute: 0.3 10*3/uL (ref 0.0–0.7)
Eosinophils Relative: 4.1 % (ref 0.0–5.0)
HCT: 38.8 % — ABNORMAL LOW (ref 39.0–52.0)
HEMOGLOBIN: 13.2 g/dL (ref 13.0–17.0)
LYMPHS PCT: 28.2 % (ref 12.0–46.0)
Lymphs Abs: 1.8 10*3/uL (ref 0.7–4.0)
MCHC: 34 g/dL (ref 30.0–36.0)
MCV: 91.9 fl (ref 78.0–100.0)
Monocytes Absolute: 0.7 10*3/uL (ref 0.1–1.0)
Monocytes Relative: 11.4 % (ref 3.0–12.0)
NEUTROS ABS: 3.5 10*3/uL (ref 1.4–7.7)
NEUTROS PCT: 55.9 % (ref 43.0–77.0)
Platelets: 293 10*3/uL (ref 150.0–400.0)
RBC: 4.22 Mil/uL (ref 4.22–5.81)
RDW: 12.6 % (ref 11.5–14.6)
WBC: 6.3 10*3/uL (ref 4.5–10.5)

## 2013-02-27 LAB — LIPID PANEL
CHOL/HDL RATIO: 3
Cholesterol: 153 mg/dL (ref 0–200)
HDL: 58.8 mg/dL (ref >39.00)
LDL Cholesterol: 71 mg/dL (ref 0–99)
Triglycerides: 118 mg/dL (ref 0.0–149.0)
VLDL: 23.6 mg/dL (ref 0.0–40.0)

## 2013-02-27 LAB — URINALYSIS
BILIRUBIN URINE: NEGATIVE
Hgb urine dipstick: NEGATIVE
KETONES UR: NEGATIVE
Leukocytes, UA: NEGATIVE
Nitrite: NEGATIVE
Specific Gravity, Urine: 1.02 (ref 1.000–1.030)
TOTAL PROTEIN, URINE-UPE24: NEGATIVE
Urine Glucose: NEGATIVE
Urobilinogen, UA: 0.2 (ref 0.0–1.0)
pH: 6 (ref 5.0–8.0)

## 2013-02-27 LAB — HEPATIC FUNCTION PANEL
ALBUMIN: 4.2 g/dL (ref 3.5–5.2)
ALK PHOS: 72 U/L (ref 39–117)
ALT: 27 U/L (ref 0–53)
AST: 21 U/L (ref 0–37)
Bilirubin, Direct: 0.1 mg/dL (ref 0.0–0.3)
TOTAL PROTEIN: 7.4 g/dL (ref 6.0–8.3)
Total Bilirubin: 0.7 mg/dL (ref 0.3–1.2)

## 2013-02-27 LAB — BASIC METABOLIC PANEL
BUN: 16 mg/dL (ref 6–23)
CHLORIDE: 104 meq/L (ref 96–112)
CO2: 29 mEq/L (ref 19–32)
Calcium: 9.1 mg/dL (ref 8.4–10.5)
Creatinine, Ser: 1.3 mg/dL (ref 0.4–1.5)
GFR: 60.34 mL/min (ref >60.00)
GLUCOSE: 91 mg/dL (ref 70–99)
POTASSIUM: 4.2 meq/L (ref 3.5–5.1)
Sodium: 140 mEq/L (ref 135–145)

## 2013-02-27 LAB — TSH: TSH: 2.56 u[IU]/mL (ref 0.35–5.50)

## 2013-02-27 NOTE — Progress Notes (Signed)
Quick Note:  Spoke with pt and notified of results per Dr. Wert. Pt verbalized understanding and denied any questions.  ______ 

## 2013-02-27 NOTE — Progress Notes (Signed)
Subjective:     Patient ID: [name] Browning, male   DOB: 05/20/1940   MRN: [id number]   Brief patient profile:  89  yowm with history of hyperlipidemia, allergic rhinitis , and depression.    History of Present Illness  03/2007--presented for CPX-hx of an abnormal EKG leading to a left heart catheter in 1994 that was felt to be completely normal and therefore felt to represent a false positive EKG, still able to exercise up to 5 times weekly including intense cross-country running.   Jun 25, 2009 cc need refill ofor simvastatin, no cp tia or claudicaiton. no change   September 15, 2009 cpx no c/o's   April 30, 2010 ov cc fatigue can only sleep with meds and stopped meds due to article in paper  06/08/2011 f/u ov/[name] Browning cc here for f/u hyperlipidemia but not fasting. No longer running due to joint problems, no ex cp, tia or claudication symptoms rec cpx due   06/18/2011 f/u ov/[name] Browning cc aflutter noted at @ colonoscopy  06/14/11 and pt completely asym x "a little more fatigued" s palp, cp, sob, presyncope.  Denies excess caffeine or decongestant use Imp RAF rec Increase aspirin to 325 mg one daily but take the coated type> cards eval    08/06/11 - s/p successful RFA   - CHADS-VASc score of 1    09/26/2012  ov/[name] Browning  Comprehensive eval / hyperlipidemia. Chief Complaint  Patient presents with  . Annual Exam    pt denies any concerns at this time   No longer running due to knees, less writing also No sob, palpitations, cough, tia, claudication rec Atenolol 25 mg daily and only use the lorazepam as needed Continue baby aspirin daily and stop your iron tablets   02/27/2013 f/u ov/[name] Browning re: hbp/ hypercholesterol Chief Complaint  Patient presents with  . Annual Exam    Discuss coming off blood pressure medication  Walking 5 x weekly, no longer running. No complaints re bp rx, just"his wife wanted to know if he can come off".  No sob/ tia/ claudication    No obvious day to day or daytime  variabilty or assoc chronic cough or cp or chest tightness, subjective wheeze overt sinus or hb symptoms. No unusual exp hx or h/o childhood pna/ asthma or knowledge of premature birth.  Sleeping ok without nocturnal  or early am exacerbation  of respiratory  c/o's or need for noct saba. Also denies any obvious fluctuation of symptoms with weather or environmental changes or other aggravating or alleviating factors except as outlined above   Current Medications, Allergies, Complete Past Medical History, Past Surgical History, Family History, and Social History were reviewed in [employer] record.  ROS  The following are not active complaints unless bolded sore throat, dysphagia, dental problems, itching, sneezing,  nasal congestion or excess/ purulent secretions, ear ache,   fever, chills, sweats, unintended wt loss, pleuritic or exertional cp, hemoptysis,  orthopnea pnd or leg swelling, presyncope, palpitations, heartburn, abdominal pain, anorexia, nausea, vomiting, diarrhea  or change in bowel or urinary habits, change in stools or urine, dysuria,hematuria,  rash, arthralgias, visual complaints, headache, numbness weakness or ataxia or problems with walking or coordination,  change in mood/affect or memory.          Past Medical History:  Adult ADD........................................................................................Marland KitchenKaur  Hyperlipidemia  - Target LDL < 130 male, type A, pos fm hx  hemorrhoidal bleeding.....................................................................Marland KitchenPatterson  - colonoscopy 05/22/2001  And 06/14/11 sleep apnea seat CPAP titration to 10  recommended 10/08/2002 for a baseline RDI of 33  - stopped 2007  Testerone def....................................................................................[name] [facility]  Health Maintenance............................................................................Marland KitchenWert  - DT 05/2011  - Pneumovax  April 15, 2008  - CPX 02/27/13  DJD Left Knee....................................................................................Marland KitchenGioffree  New A flutter 06/18/2011 ........................................................................[name] [place] Cards          Objective:   Physical Exam GEN: A/Ox3; pleasant , NAD, slightly anxious  wt 153 February 07, 2008 > > 147 September 15, 2009 > 151 April 30, 2010 > 152 06/08/2011 > 06/18/2011  149> 09/26/2012  146  > 02/27/2013 148  HEENT: [place]/AT, , EACs-clear, TMs-wnl, NOSE-clear, THROAT-clear  NECK: Supple w/ fair ROM; no JVD; normal carotid impulses w/o bruits; no thyromegaly or nodules palpated; no lymphadenopathy.  CHEST clear to a and P bilaterally  HEART: RRR, no m/r/g  Pulse 70 and perfectly regular ABDOMEN: Soft & nt; nml bowel sounds; no organomegaly or masses detected.  EXT: Warm bil, no calf pain, edema, clubbing, pulses ok x L DP reduced GU / rectal per urology/ on testosterone gel MS nl gait, no deformity Neuro nl sensorium, no tremors, no motor or cerebellar deficitis      CXR  02/27/2013 :  No active cardiopulmonary disease   Assessment:

## 2013-02-27 NOTE — Patient Instructions (Addendum)
Please remember to go to the lab and x-ray department downstairs for your tests - we will call you with the results when they are available.     Please schedule a follow up visit in 12  months but call sooner if needed

## 2013-02-28 NOTE — Assessment & Plan Note (Signed)
-   colonoscopy 05/22/2001  And 06/14/11 > c/w hemorrhoids and cecal avm   Recent Labs Lab 02/27/13 1054  HGB 13.2     Rectal exams per urology yearly on testosterone gel

## 2013-02-28 NOTE — Assessment & Plan Note (Signed)
Lab Results  Component Value Date   CREATININE 1.3 02/27/2013   CREATININE 1.3 09/26/2012   CREATININE 1.4 07/30/2011     Adequate control on present rx, reviewed > no change in rx needed   He's a very tense individual who appears to be benefiting from low dose BB so rec in the absence of any side effects that he continue.

## 2013-02-28 NOTE — Assessment & Plan Note (Signed)
-   Left Heart Cath nl 1994 ([facility]) x LVEDP 17     - Onset ? (asymptomatic    - TSH 06/18/2011 > nl as is BNP   - s/p successful RFA  07/2011      With CHADS-VASc score of 1  Maintaining nsr

## 2013-02-28 NOTE — Assessment & Plan Note (Signed)
-   Target LDL < 130 male, type A, pos fm hx   Lab Results  Component Value Date   CHOL 153 02/27/2013   HDL 58.80 02/27/2013   LDLCALC 71 02/27/2013   TRIG 118.0 02/27/2013   CHOLHDL 3 02/27/2013    Adequate control on present rx, reviewed > no change in rx needed

## 2013-03-22 DIAGNOSIS — D1801 Hemangioma of skin and subcutaneous tissue: Secondary | ICD-10-CM | POA: Diagnosis not present

## 2013-03-26 ENCOUNTER — Other Ambulatory Visit: Payer: Self-pay | Admitting: Internal Medicine

## 2013-04-19 DIAGNOSIS — R6882 Decreased libido: Secondary | ICD-10-CM | POA: Diagnosis not present

## 2013-04-19 DIAGNOSIS — N529 Male erectile dysfunction, unspecified: Secondary | ICD-10-CM | POA: Diagnosis not present

## 2013-04-19 DIAGNOSIS — E291 Testicular hypofunction: Secondary | ICD-10-CM | POA: Diagnosis not present

## 2013-05-14 DIAGNOSIS — D1801 Hemangioma of skin and subcutaneous tissue: Secondary | ICD-10-CM | POA: Diagnosis not present

## 2013-05-14 DIAGNOSIS — L570 Actinic keratosis: Secondary | ICD-10-CM | POA: Diagnosis not present

## 2013-05-14 DIAGNOSIS — L821 Other seborrheic keratosis: Secondary | ICD-10-CM | POA: Diagnosis not present

## 2013-05-14 DIAGNOSIS — L819 Disorder of pigmentation, unspecified: Secondary | ICD-10-CM | POA: Diagnosis not present

## 2013-05-24 DIAGNOSIS — R6882 Decreased libido: Secondary | ICD-10-CM | POA: Diagnosis not present

## 2013-05-24 DIAGNOSIS — E291 Testicular hypofunction: Secondary | ICD-10-CM | POA: Diagnosis not present

## 2013-05-28 DIAGNOSIS — Z961 Presence of intraocular lens: Secondary | ICD-10-CM | POA: Diagnosis not present

## 2013-06-13 ENCOUNTER — Telehealth: Payer: Self-pay | Admitting: Internal Medicine

## 2013-06-13 NOTE — Telephone Encounter (Signed)
LMOMTCB x 1 

## 2013-06-14 NOTE — Telephone Encounter (Signed)
Can't make recs like this over the phone, needs ov with all active meds in hand to regroup and go from there

## 2013-06-14 NOTE — Telephone Encounter (Signed)
Spoke with pt spouse. She scheduled appt for tomorrow. Nothing further needed

## 2013-06-14 NOTE — Telephone Encounter (Signed)
Spoke with the [name] Browning's spouse  She states that the [name] Browning had labs done recently and his Hemoglobin was 12.6, RBC 4.01, Hematacrit was 36.3  [name] Browning concerned about these numbers  Has been feeling tired  He has had a recent colonoscopy  Wants recs from [place]  Please advise thanks

## 2013-06-15 ENCOUNTER — Encounter: Payer: Self-pay | Admitting: Internal Medicine

## 2013-06-15 ENCOUNTER — Ambulatory Visit (INDEPENDENT_AMBULATORY_CARE_PROVIDER_SITE_OTHER): Payer: Medicare Other | Admitting: Internal Medicine

## 2013-06-15 ENCOUNTER — Other Ambulatory Visit (INDEPENDENT_AMBULATORY_CARE_PROVIDER_SITE_OTHER): Payer: Medicare Other

## 2013-06-15 ENCOUNTER — Encounter (INDEPENDENT_AMBULATORY_CARE_PROVIDER_SITE_OTHER): Payer: Self-pay

## 2013-06-15 VITALS — BP 132/70 | HR 70 | Temp 98.7°F | Ht 67.0 in | Wt 148.0 lb

## 2013-06-15 DIAGNOSIS — I10 Essential (primary) hypertension: Secondary | ICD-10-CM

## 2013-06-15 DIAGNOSIS — D638 Anemia in other chronic diseases classified elsewhere: Secondary | ICD-10-CM

## 2013-06-15 DIAGNOSIS — D649 Anemia, unspecified: Secondary | ICD-10-CM | POA: Diagnosis not present

## 2013-06-15 DIAGNOSIS — E291 Testicular hypofunction: Secondary | ICD-10-CM | POA: Diagnosis not present

## 2013-06-15 HISTORY — DX: Anemia in other chronic diseases classified elsewhere: D63.8

## 2013-06-15 LAB — CBC WITH DIFFERENTIAL/PLATELET
BASOS ABS: 0 10*3/uL (ref 0.0–0.1)
Basophils Relative: 0.3 % (ref 0.0–3.0)
EOS ABS: 0.4 10*3/uL (ref 0.0–0.7)
Eosinophils Relative: 6.7 % — ABNORMAL HIGH (ref 0.0–5.0)
HCT: 35.9 % — ABNORMAL LOW (ref 39.0–52.0)
Hemoglobin: 12.4 g/dL — ABNORMAL LOW (ref 13.0–17.0)
LYMPHS PCT: 32.2 % (ref 12.0–46.0)
Lymphs Abs: 1.7 10*3/uL (ref 0.7–4.0)
MCHC: 34.5 g/dL (ref 30.0–36.0)
MCV: 93.4 fl (ref 78.0–100.0)
MONOS PCT: 12.6 % — AB (ref 3.0–12.0)
Monocytes Absolute: 0.7 10*3/uL (ref 0.1–1.0)
NEUTROS PCT: 48.2 % (ref 43.0–77.0)
Neutro Abs: 2.6 10*3/uL (ref 1.4–7.7)
Platelets: 295 10*3/uL (ref 150.0–400.0)
RBC: 3.84 Mil/uL — ABNORMAL LOW (ref 4.22–5.81)
RDW: 12.5 % (ref 11.5–14.6)
WBC: 5.4 10*3/uL (ref 4.5–10.5)

## 2013-06-15 LAB — IBC PANEL
IRON: 79 ug/dL (ref 42–165)
Saturation Ratios: 24.4 % (ref 20.0–50.0)
TRANSFERRIN: 231 mg/dL (ref 212.0–360.0)

## 2013-06-15 NOTE — Progress Notes (Signed)
Subjective:     Patient ID: [name] Browning, male   DOB: 26-Feb-1940   MRN: [id number]   Brief patient profile:  30  yowm with history of hyperlipidemia, allergic rhinitis , and depression.    History of Present Illness  03/2007--presented for CPX-hx of an abnormal EKG leading to a left heart catheter in 1994 that was felt to be completely normal and therefore felt to represent a false positive EKG, still able to exercise up to 5 times weekly including intense cross-country running.   Jun 25, 2009 cc need refill ofor simvastatin, no cp tia or claudicaiton. no change   September 15, 2009 cpx no c/o's   April 30, 2010 ov cc fatigue can only sleep with meds and stopped meds due to article in paper  06/08/2011 f/u ov/Wert cc here for f/u hyperlipidemia but not fasting. No longer running due to joint problems, no ex cp, tia or claudication symptoms rec cpx due   06/18/2011 f/u ov/Wert cc aflutter noted at @ colonoscopy  06/14/11 and pt completely asym x "a little more fatigued" s palp, cp, sob, presyncope.  Denies excess caffeine or decongestant use Imp RAF rec Increase aspirin to 325 mg one daily but take the coated type> cards eval    08/06/11 - s/p successful RFA   - CHADS-VASc score of 1    09/26/2012  ov/Wert  Comprehensive eval / hyperlipidemia. Chief Complaint  Patient presents with  . Annual Exam    pt denies any concerns at this time   No longer running due to knees, less writing also No sob, palpitations, cough, tia, claudication rec Atenolol 25 mg daily and only use the lorazepam as needed Continue baby aspirin daily and stop your iron tablets   02/27/2013 f/u ov/Wert re: hbp/ hypercholesterol Chief Complaint  Patient presents with  . Annual Exam    Discuss coming off blood pressure medication  Walking 5 x weekly, no longer running. No complaints re bp rx, just"his wife wanted to know if he can come off".  No sob/ tia/ claudication. rec No change rx   06/15/2013 f/u  ov/Wert re: [place] Chief Complaint  Patient presents with  . Acute Visit    Pt concerned about recent abnormal labs- hemoglobin. He states has been feeling fatigued "for years". Insurance stopped paying for androgel approx 1 month ago and he wonders if this could cause fatigue.    not really clear the fatigue is any worse than usual. On asa 81 mg daily but no obvious blood loss/ abd pain. Did have avms on colonscopy 05/2011 [facility] I[facility]   No obvious day to day or daytime variabilty or assoc chronic cough or cp or chest tightness, subjective wheeze overt sinus or hb symptoms. No unusual exp hx or h/o childhood pna/ asthma or knowledge of premature birth.  Sleeping ok without nocturnal  or early am exacerbation  of respiratory  c/o's or need for noct saba. Also denies any obvious fluctuation of symptoms with weather or environmental changes or other aggravating or alleviating factors except as outlined above   Current Medications, Allergies, Complete Past Medical History, Past Surgical History, Family History, and Social History were reviewed in [employer] record.  ROS  The following are not active complaints unless bolded sore throat, dysphagia, dental problems, itching, sneezing,  nasal congestion or excess/ purulent secretions, ear ache,   fever, chills, sweats, unintended wt loss, pleuritic or exertional cp, hemoptysis,  orthopnea pnd or leg swelling, presyncope, palpitations, heartburn, abdominal  pain, anorexia, nausea, vomiting, diarrhea  or change in bowel or urinary habits, change in stools or urine, dysuria,hematuria,  rash, arthralgias, visual complaints, headache, numbness weakness or ataxia or problems with walking or coordination,  change in mood/affect or memory.          Past Medical History:  Adult ADD........................................................................................Marland KitchenKaur  Hyperlipidemia  - Target LDL < 130 male, type A, pos fm hx   hemorrhoidal bleeding.....................................................................Marland KitchenPatterson  - colonoscopy 05/22/2001  And 06/14/11 with AVM's sleep apnea seat CPAP titration to 10 recommended 10/08/2002 for a baseline RDI of 33  - stopped 2007  Testerone def....................................................................................[name] [facility]  Health Maintenance............................................................................Marland KitchenWert  - DT 05/2011  - Pneumovax April 15, 2008  - CPX 02/27/13  DJD Left Knee....................................................................................Marland KitchenGioffree  New A flutter 06/18/2011 ........................................................................[name] [place] Cards          Objective:   Physical Exam GEN: A/Ox3; pleasant , NAD, slightly anxious  wt 153 February 07, 2008 > > 147 September 15, 2009 > 151 April 30, 2010 > 152 06/08/2011 > 06/18/2011  149> 09/26/2012  146  > 02/27/2013 148 > 06/15/2013  148  HEENT: [place]/AT, , EACs-clear, TMs-wnl, NOSE-clear, THROAT-clear  NECK: Supple w/ fair ROM; no JVD; normal carotid impulses w/o bruits; no thyromegaly or nodules palpated; no lymphadenopathy.  CHEST clear to a and P bilaterally  HEART: RRR, no m/r/g  Pulse 70 and perfectly regular ABDOMEN: Soft & nt; nml bowel sounds; no organomegaly or masses detected.  EXT: Warm bil, no calf pain, edema, clubbing, pulses ok x L DP reduced GU / rectal per urology/ on testosterone gel MS nl gait, no deformity Neuro nl sensorium, no tremors, no motor or cerebellar deficitis      CXR  02/27/2013 :  No active cardiopulmonary disease   Assessment:

## 2013-06-15 NOTE — Patient Instructions (Addendum)
Please remember to go to the lab   department downstairs for your tests - we will call you with the results when they are available.  Eat meat a couple times a week or substituted based on internet diet.

## 2013-06-16 NOTE — Assessment & Plan Note (Signed)
Adequate control on present rx, reviewed > no change in rx needed   

## 2013-06-16 NOTE — Assessment & Plan Note (Signed)
  Lab Results  Component Value Date   HGB 12.4* 06/15/2013   HGB 13.2 02/27/2013   HGB 14.3 09/26/2012     Probably due to intermittent occult gib from asa/avm's > check stool cards next

## 2013-06-16 NOTE — Assessment & Plan Note (Signed)
rec f/u urology to see if can get testosterone restarted

## 2013-06-18 NOTE — Progress Notes (Signed)
Quick Note:  Stool cards up front for pick up  LMTCB ______

## 2013-06-20 ENCOUNTER — Telehealth: Payer: Self-pay | Admitting: Internal Medicine

## 2013-06-20 NOTE — Telephone Encounter (Signed)
Spoke with pt's spouse and notified of lab results per Dr. Melvyn Novas. Sheverbalized understanding and denied any questions. She will go ahead and pick up the stool cards

## 2013-06-20 NOTE — Progress Notes (Signed)
Quick Note:  Spouse notified and will relay to the pt  Will come by and pick up stool cards ______

## 2013-06-27 ENCOUNTER — Encounter: Payer: Self-pay | Admitting: Internal Medicine

## 2013-06-27 ENCOUNTER — Encounter: Payer: Self-pay | Admitting: *Deleted

## 2013-06-27 ENCOUNTER — Other Ambulatory Visit (INDEPENDENT_AMBULATORY_CARE_PROVIDER_SITE_OTHER): Payer: Medicare Other

## 2013-06-27 ENCOUNTER — Other Ambulatory Visit: Payer: Self-pay | Admitting: Internal Medicine

## 2013-06-27 DIAGNOSIS — D649 Anemia, unspecified: Secondary | ICD-10-CM

## 2013-06-27 LAB — HEMOCCULT SLIDES (X 3 CARDS)
Fecal Occult Blood: NEGATIVE
OCCULT 1: NEGATIVE
OCCULT 2: NEGATIVE
OCCULT 3: NEGATIVE
OCCULT 4: NEGATIVE
OCCULT 5: NEGATIVE

## 2013-06-28 NOTE — Progress Notes (Signed)
Quick Note:  LMTCB ______ 

## 2013-06-29 NOTE — Progress Notes (Signed)
Quick Note:  Spoke with pt and notified of results per Dr. Wert. Pt verbalized understanding and denied any questions.  ______ 

## 2013-08-08 DIAGNOSIS — E291 Testicular hypofunction: Secondary | ICD-10-CM | POA: Diagnosis not present

## 2013-08-08 DIAGNOSIS — N139 Obstructive and reflux uropathy, unspecified: Secondary | ICD-10-CM | POA: Diagnosis not present

## 2013-08-08 DIAGNOSIS — Z125 Encounter for screening for malignant neoplasm of prostate: Secondary | ICD-10-CM | POA: Diagnosis not present

## 2013-08-08 DIAGNOSIS — N401 Enlarged prostate with lower urinary tract symptoms: Secondary | ICD-10-CM | POA: Diagnosis not present

## 2013-08-08 DIAGNOSIS — N529 Male erectile dysfunction, unspecified: Secondary | ICD-10-CM | POA: Diagnosis not present

## 2013-08-08 DIAGNOSIS — N138 Other obstructive and reflux uropathy: Secondary | ICD-10-CM | POA: Diagnosis not present

## 2013-08-12 ENCOUNTER — Other Ambulatory Visit: Payer: Self-pay | Admitting: Internal Medicine

## 2013-08-23 DIAGNOSIS — F331 Major depressive disorder, recurrent, moderate: Secondary | ICD-10-CM | POA: Diagnosis not present

## 2013-08-31 ENCOUNTER — Telehealth: Payer: Self-pay | Admitting: Internal Medicine

## 2013-08-31 NOTE — Telephone Encounter (Signed)
Rec'd from [place] forward 3 pages to Dr.Wert

## 2013-09-04 ENCOUNTER — Encounter: Payer: Self-pay | Admitting: Internal Medicine

## 2013-09-04 ENCOUNTER — Telehealth: Payer: Self-pay | Admitting: Internal Medicine

## 2013-09-04 DIAGNOSIS — D649 Anemia, unspecified: Secondary | ICD-10-CM

## 2013-09-04 NOTE — Telephone Encounter (Signed)
They were sent down to file but not apparent on emr yet

## 2013-09-04 NOTE — Telephone Encounter (Signed)
Spouse aware of below. Referral placed. Nothing further needed

## 2013-09-04 NOTE — Telephone Encounter (Signed)
Per Dr Melvyn Novas, seems slightly worse with unknown etiology  Needs Hematology referral  [facility] for the pt's spouse

## 2013-09-04 NOTE — Telephone Encounter (Signed)
Pt recently had bloodwork checked x 2 weeks ago, results were borderline anemia- Dr Toy Care. Dr Toy Care was supposed send this correspondence to Dr Melvyn Novas to review.  Pt started back on Androgel - x 1 month ago Pt still having increased fatigue and low energy levels. Low Vit D levels shown-- pt prescribed Vit D  Dr Melvyn Novas please advise if you have received any correspondence regarding this patients blood work.  Pt wife also wanting to know what kind of anemia the patient has--states that they have been talking to a Nutritionist--needs to know what type in order to know which direction to lead the pt. Thanks.

## 2013-09-05 ENCOUNTER — Telehealth: Payer: Self-pay | Admitting: Hematology and Oncology

## 2013-09-05 NOTE — Telephone Encounter (Signed)
LEFT MESSAGE FOR PATIENT AND GAVE NP APPT FOR 07/31 @ 10 W/DR. [place].

## 2013-09-05 NOTE — Telephone Encounter (Signed)
PATIENT WIFE CALLED TO CONFIRM APPT.

## 2013-09-07 DIAGNOSIS — E291 Testicular hypofunction: Secondary | ICD-10-CM | POA: Diagnosis not present

## 2013-09-14 ENCOUNTER — Ambulatory Visit: Payer: Medicare Other

## 2013-09-14 ENCOUNTER — Encounter: Payer: Self-pay | Admitting: Hematology and Oncology

## 2013-09-14 ENCOUNTER — Encounter (INDEPENDENT_AMBULATORY_CARE_PROVIDER_SITE_OTHER): Payer: Self-pay

## 2013-09-14 ENCOUNTER — Ambulatory Visit (HOSPITAL_BASED_OUTPATIENT_CLINIC_OR_DEPARTMENT_OTHER): Payer: Medicare Other | Admitting: Hematology and Oncology

## 2013-09-14 VITALS — BP 123/70 | HR 63 | Temp 98.3°F | Resp 20 | Ht 67.0 in | Wt 148.3 lb

## 2013-09-14 DIAGNOSIS — R5383 Other fatigue: Secondary | ICD-10-CM | POA: Diagnosis not present

## 2013-09-14 DIAGNOSIS — G479 Sleep disorder, unspecified: Secondary | ICD-10-CM | POA: Diagnosis not present

## 2013-09-14 DIAGNOSIS — F329 Major depressive disorder, single episode, unspecified: Secondary | ICD-10-CM | POA: Diagnosis not present

## 2013-09-14 DIAGNOSIS — R5381 Other malaise: Secondary | ICD-10-CM

## 2013-09-14 DIAGNOSIS — F3289 Other specified depressive episodes: Secondary | ICD-10-CM

## 2013-09-14 DIAGNOSIS — H109 Unspecified conjunctivitis: Secondary | ICD-10-CM | POA: Diagnosis not present

## 2013-09-14 DIAGNOSIS — D649 Anemia, unspecified: Secondary | ICD-10-CM | POA: Diagnosis not present

## 2013-09-14 DIAGNOSIS — E291 Testicular hypofunction: Secondary | ICD-10-CM | POA: Diagnosis not present

## 2013-09-14 DIAGNOSIS — D638 Anemia in other chronic diseases classified elsewhere: Secondary | ICD-10-CM

## 2013-09-14 DIAGNOSIS — F32A Depression, unspecified: Secondary | ICD-10-CM

## 2013-09-14 HISTORY — DX: Unspecified conjunctivitis: H10.9

## 2013-09-14 MED ORDER — TOBRAMYCIN 0.3 % OP SOLN: 1.0000 [drp] | Freq: Four times a day (QID) | OPHTHALMIC | Status: DC

## 2013-09-14 NOTE — Assessment & Plan Note (Signed)
He is on testosterone replacement therapy. He has strong family history of prostate cancer. I recommend he gets regular PSA screening to consider not being on testosterone replacement therapy.

## 2013-09-14 NOTE — Assessment & Plan Note (Signed)
His blood work is exactly as it was at baseline 6 years ago. I suspect the cause is due to low testosterone. According to him, he just restarted testosterone replacement therapy recently. I suspect the anemia would correct itself. I do not believe the anemia is the contributing factor to his extreme fatigue. He does not require further workup for anemia.

## 2013-09-14 NOTE — Assessment & Plan Note (Signed)
Patient had evaluation in the past by pulmonologist. He is taking various different psychiatric medications that could affect his sleep. I suspect this is the main contributing factor for his fatigue.

## 2013-09-14 NOTE — Progress Notes (Signed)
Checked in new pt with no financial concerns. °

## 2013-09-14 NOTE — Progress Notes (Signed)
[facility] CONSULT NOTE  Patient Care Team: Tanda Rockers, MD as PCP - General (Pulmonary Disease)  CHIEF COMPLAINTS/PURPOSE OF CONSULTATION:  Anemia  HISTORY OF PRESENTING ILLNESS:  [name] Browning 73 y.o. male is here because of anemia and fatigue.  He was found to have abnormal CBC from recent blood count monitoring in May. On review of his blood work since 2009, his baseline hemoglobin is around 12. He had been on testosterone replacement therapy for almost 2 years and his blood count since then has improved back to normal. However, he discontinued testosterone replacement 6 months ago due to cost issues. He just restarted a month ago. He had blood count monitored in July and show recurrence of his anemia. Due to fatigue he is being referred here. He denies recent chest pain on exertion, shortness of breath on minimal exertion, pre-syncopal episodes, or palpitations. In fact, the patient can play golf every day and walks several miles a day without fatigue. He takes Ativan for seen in the morning and again at 4pm. By midday, he usually needs to take a nap. At nighttime, he takes trazodone but still does not sleep well and wakes up several times at night. The patient also takes Adderall for attention deficit and antidepressants. He denies depression or suicidal ideation. He had not noticed any recent bleeding such as epistaxis, hematuria or hematochezia The patient denies over the counter NSAID ingestion. He is on antiplatelets agents. His last colonoscopy was in 2013 which show arteriovenous malformation. He had no prior history or diagnosis of cancer. His age appropriate screening programs are up-to-date. He denies any pica and eats a variety of diet. He donated blood many years ago. He denies blood transfusion The patient was prescribed oral iron supplements and he takes twice a day for year. Recently, he complained of some redness around the eye. He wear contact lenses  regularly.  MEDICAL HISTORY:  Past Medical History  Diagnosis Date  . ADD (attention deficit disorder)   . Hyperlipidemia   . DJD (degenerative joint disease) of knee   . Depression   . Anxiety   . Cataracts, bilateral   . Heart murmur     a. 06/2011 Echo: 65-70%, mild MR  . Sleep apnea     could not tolerate cpap  . Atrial flutter     a. s/p RFCA 07/2011.  Xarelto d/c 08/2011.  . S/P cardiac cath     a. in 1990's - Dr. Gwenlyn Found - WNL  . Conjunctivitis 09/14/2013    SURGICAL HISTORY: Past Surgical History  Procedure Laterality Date  . Wrist fracture surgery  2007    left    SOCIAL HISTORY: History   Social History  . Marital Status: Married    Spouse Name: N/A    Number of Children: N/A  . Years of Education: N/A   Occupational History  . RetiredWater quality scientist for [employer]    Social History Main Topics  . Smoking status: Former Smoker    Types: Cigarettes    Quit date: 02/16/1968  . Smokeless tobacco: Never Used  . Alcohol Use: No  . Drug Use: Not on file  . Sexual Activity: Not on file   Other Topics Concern  . Not on file   Social History Narrative  . No narrative on file    FAMILY HISTORY: Family History  Problem Relation Age of Onset  . Prostate cancer Father   . Heart disease Mother   . Leukemia  Mother   . Colon cancer Neg Hx   . Stomach cancer Neg Hx     ALLERGIES:  has No Known Allergies.  MEDICATIONS:  Current Outpatient Prescriptions  Medication Sig Dispense Refill  . amphetamine-dextroamphetamine (ADDERALL) 20 MG tablet Take 20 mg by mouth 3 (three) times daily.      . ANDROGEL PUMP 20.25 MG/ACT (1.62%) GEL       . aspirin 81 MG tablet Take 81 mg by mouth daily.      [name] atenolol (TENORMIN) 25 MG tablet TAKE 1 TABLET (25 MG TOTAL) BY MOUTH DAILY.  30 tablet  11  . IRON PO Take 1 tablet by mouth 2 (two) times daily.      . Levomilnacipran HCl ER (FETZIMA) 80 MG CP24 Take 1 tablet by mouth daily.      [name] LORazepam (ATIVAN) 1 MG tablet Take  1 mg by mouth every 8 (eight) hours.      [name] RAPAFLO 8 MG CAPS capsule       . simvastatin (ZOCOR) 40 MG tablet TAKE 1 TABLET (40 MG TOTAL) BY MOUTH DAILY.  30 tablet  6  . traZODone (DESYREL) 50 MG tablet Take 100 mg by mouth at bedtime.       . Vitamin D, Ergocalciferol, (DRISDOL) 50000 UNITS CAPS capsule Take 50,000 Units by mouth every 7 (seven) days.      [name] tobramycin (TOBREX) 0.3 % ophthalmic solution Place 1 drop into both eyes every 6 (six) hours.  5 mL  0   No current facility-administered medications for this visit.    REVIEW OF SYSTEMS:   Constitutional: Denies fevers, chills or abnormal night sweats Ears, nose, mouth, throat, and face: Denies mucositis or sore throat Respiratory: Denies cough, dyspnea or wheezes Cardiovascular: Denies palpitation, chest discomfort or lower extremity swelling Gastrointestinal:  Denies nausea, heartburn or change in bowel habits Skin: Denies abnormal skin rashes Lymphatics: Denies new lymphadenopathy or easy bruising Neurological:Denies numbness, tingling or new weaknesses Behavioral/Psych: Mood is stable, no new changes  All other systems were reviewed with the patient and are negative.  PHYSICAL EXAMINATION: ECOG PERFORMANCE STATUS: 0 - Asymptomatic  Filed Vitals:   09/14/13 1007  BP: 123/70  Pulse: 63  Temp: 98.3 F (36.8 C)  Resp: 20   Filed Weights   09/14/13 1007  Weight: 148 lb 4.8 oz (67.268 kg)    GENERAL:alert, no distress and comfortable SKIN: skin color, texture, turgor are normal, no rashes or significant lesions EYES: Bilateral conjunctivitis.  OROPHARYNX:no exudate, no erythema and lips, buccal mucosa, and tongue normal  NECK: supple, thyroid normal size, non-tender, without nodularity LYMPH:  no palpable lymphadenopathy in the cervical, axillary or inguinal LUNGS: clear to auscultation and percussion with normal breathing effort HEART: regular rate & rhythm and no murmurs and no lower extremity  edema ABDOMEN:abdomen soft, non-tender and normal bowel sounds Musculoskeletal:no cyanosis of digits and no clubbing  PSYCH: alert & oriented x 3 with fluent speech NEURO: no focal motor/sensory deficits  LABORATORY DATA:  I have reviewed the data as listed Recent Results (from the past 2160 hour(s))  HEMOCCULT SLIDES (X 3 CARDS)     Status: None   Collection Time    06/27/13  4:05 PM      Result Value Ref Range   OCCULT 1 Negative  Negative   OCCULT 2 Negative  Negative   OCCULT 3 Negative  Negative   OCCULT 4 Negative  Negative   OCCULT 5 Negative  Negative  Fecal Occult Blood Negative  Negative   ASSESSMENT & PLAN:  Anemia in chronic illness His blood work is exactly as it was at baseline 6 years ago. I suspect the cause is due to low testosterone. According to him, he just restarted testosterone replacement therapy recently. I suspect the anemia would correct itself. I do not believe the anemia is the contributing factor to his extreme fatigue. He does not require further workup for anemia.  SLEEP DISORDER UNSPECIFIED Patient had evaluation in the past by pulmonologist. He is taking various different psychiatric medications that could affect his sleep. I suspect this is the main contributing factor for his fatigue.  TESTICULAR HYPOFUNCTION He is on testosterone replacement therapy. He has strong family history of prostate cancer. I recommend he gets regular PSA screening to consider not being on testosterone replacement therapy.  Conjunctivitis I recommend he removed the contact lens and to use topical eyedrops for a week and replace his contact lens  Depression He is on Ativan twice a day along with this anti-depressant. In addition he is also on Adderall for attention deficit and trazodone for insomnia. I recommend he consult with a psychiatrist to see if he can be taper off the Ativan if possible and to adjust his psychiatric medications.      All questions  were answered. The patient knows to call the clinic with any problems, questions or concerns. I spent 30 minutes counseling the patient face to face. The total time spent in the appointment was 40 minutes and more than 50% was on counseling.     [place], [place], MD 09/14/2013 10:46 AM

## 2013-09-14 NOTE — Assessment & Plan Note (Signed)
He is on Ativan twice a day along with this anti-depressant. In addition he is also on Adderall for attention deficit and trazodone for insomnia. I recommend he consult with a psychiatrist to see if he can be taper off the Ativan if possible and to adjust his psychiatric medications.

## 2013-09-14 NOTE — Assessment & Plan Note (Signed)
I recommend he removed the contact lens and to use topical eyedrops for a week and replace his contact lens

## 2013-09-24 DIAGNOSIS — H16209 Unspecified keratoconjunctivitis, unspecified eye: Secondary | ICD-10-CM | POA: Diagnosis not present

## 2013-10-01 DIAGNOSIS — H16329 Diffuse interstitial keratitis, unspecified eye: Secondary | ICD-10-CM | POA: Diagnosis not present

## 2013-10-02 ENCOUNTER — Other Ambulatory Visit: Payer: Self-pay | Admitting: Internal Medicine

## 2013-10-03 DIAGNOSIS — H16329 Diffuse interstitial keratitis, unspecified eye: Secondary | ICD-10-CM | POA: Diagnosis not present

## 2013-10-08 DIAGNOSIS — H16329 Diffuse interstitial keratitis, unspecified eye: Secondary | ICD-10-CM | POA: Diagnosis not present

## 2013-10-11 DIAGNOSIS — N138 Other obstructive and reflux uropathy: Secondary | ICD-10-CM | POA: Diagnosis not present

## 2013-10-11 DIAGNOSIS — E291 Testicular hypofunction: Secondary | ICD-10-CM | POA: Diagnosis not present

## 2013-10-11 DIAGNOSIS — N529 Male erectile dysfunction, unspecified: Secondary | ICD-10-CM | POA: Diagnosis not present

## 2013-10-11 DIAGNOSIS — N401 Enlarged prostate with lower urinary tract symptoms: Secondary | ICD-10-CM | POA: Diagnosis not present

## 2013-10-17 ENCOUNTER — Other Ambulatory Visit: Payer: Self-pay | Admitting: Internal Medicine

## 2013-12-31 DIAGNOSIS — D1801 Hemangioma of skin and subcutaneous tissue: Secondary | ICD-10-CM | POA: Diagnosis not present

## 2013-12-31 DIAGNOSIS — L603 Nail dystrophy: Secondary | ICD-10-CM | POA: Diagnosis not present

## 2014-01-16 ENCOUNTER — Telehealth: Payer: Self-pay | Admitting: Internal Medicine

## 2014-01-16 NOTE — Telephone Encounter (Signed)
New message     Talk to Dr Caryl Comes about simvastatin causing confusion.  Wife said Dr Caryl Comes put a stent in him a couple of years ago and she has a lot of confidence in him and want his opinion regarding simvastatin.

## 2014-01-17 NOTE — Telephone Encounter (Signed)
Advised to stop per Dr. Caryl Comes and follow up with PCP. Pt's wife verbalized understanding and agreeable.

## 2014-01-21 ENCOUNTER — Telehealth: Payer: Self-pay | Admitting: Internal Medicine

## 2014-01-21 NOTE — Telephone Encounter (Signed)
lmtcb x1 

## 2014-01-21 NOTE — Telephone Encounter (Signed)
Leave off until after the 1st of year then ov to regroup (eat lots of salmon to offset off the statin in meantime)

## 2014-01-21 NOTE — Telephone Encounter (Signed)
I spoke with the pt spouse and she states that the pt last week became very confused so they called his cardiologists and was advised to stop the simvastatin and to call Dr. Melvyn Novas to discuss what to do from here. She states the confusion is gone since stopping this med. They want to know what to do from here. Please advise. Choudrant Bing, CMA

## 2014-01-21 NOTE — Telephone Encounter (Signed)
Spoke with the pt's spouse and notified of recs per MW  She verbalized understanding  Nothing further needed 

## 2014-01-24 ENCOUNTER — Encounter (HOSPITAL_COMMUNITY): Payer: Self-pay | Admitting: Internal Medicine

## 2014-01-28 DIAGNOSIS — Z23 Encounter for immunization: Secondary | ICD-10-CM | POA: Diagnosis not present

## 2014-02-19 ENCOUNTER — Ambulatory Visit (INDEPENDENT_AMBULATORY_CARE_PROVIDER_SITE_OTHER): Payer: Medicare Other | Admitting: Internal Medicine

## 2014-02-19 ENCOUNTER — Encounter: Payer: Self-pay | Admitting: Internal Medicine

## 2014-02-19 VITALS — BP 120/70 | HR 78 | Temp 97.9°F | Ht 66.0 in | Wt 150.0 lb

## 2014-02-19 DIAGNOSIS — R05 Cough: Secondary | ICD-10-CM

## 2014-02-19 DIAGNOSIS — F1996 Other psychoactive substance use, unspecified with psychoactive substance-induced persisting amnestic disorder: Secondary | ICD-10-CM

## 2014-02-19 DIAGNOSIS — R059 Cough, unspecified: Secondary | ICD-10-CM

## 2014-02-19 DIAGNOSIS — E785 Hyperlipidemia, unspecified: Secondary | ICD-10-CM

## 2014-02-19 HISTORY — DX: Cough, unspecified: R05.9

## 2014-02-19 MED ORDER — METHYLPREDNISOLONE ACETATE 80 MG/ML IJ SUSP
120.0000 mg | Freq: Once | INTRAMUSCULAR | Status: AC
  Administered 2014-02-19: 120 mg via INTRAMUSCULAR

## 2014-02-19 NOTE — Patient Instructions (Signed)
Pepcid ac 20 mg one at bedtime whenever your coughing / clearing throat/ etc until no symptoms for at least at week   Depomedrol 120 mg today should knock out your cough for now  GERD (REFLUX)  is an extremely common cause of respiratory symptoms just like yours , many times with no obvious heartburn at all.    It can be treated with medication, but also with lifestyle changes including avoidance of late meals, excessive alcohol, smoking cessation, and avoid fatty foods, chocolate, peppermint, colas, red wine, and acidic juices such as orange juice.  NO MINT OR MENTHOL PRODUCTS SO NO COUGH DROPS  USE SUGARLESS CANDY INSTEAD (Jolley ranchers or Stover's or Life Savers) or even ice chips will also do - the key is to swallow to prevent all throat clearing. NO OIL BASED VITAMINS - use powdered substitutes.   Please schedule a follow up office visit in 6 weeks, call sooner if needed fasting to recheck your cholesterol

## 2014-02-19 NOTE — Progress Notes (Signed)
Subjective:     Patient ID: [name] Browning, male   DOB: 1940/04/01   MRN: [id number]   Brief patient profile:  24  yowm with history of hyperlipidemia, allergic rhinitis , and depression.    History of Present Illness  03/2007--presented for CPX-hx of an abnormal EKG leading to a left heart catheter in 1994 that was felt to be completely normal and therefore felt to represent a false positive EKG, still able to exercise up to 5 times weekly including intense cross-country running.   Jun 25, 2009 cc need refill ofor simvastatin, no cp tia or claudicaiton. no change   September 15, 2009 cpx no c/o's   April 30, 2010 ov cc fatigue can only sleep with meds and stopped meds due to article in paper  06/08/2011 f/u ov/[name] Browning cc here for f/u hyperlipidemia but not fasting. No longer running due to joint problems, no ex cp, tia or claudication symptoms rec cpx due   06/18/2011 f/u ov/[name] Browning cc aflutter noted at @ colonoscopy  06/14/11 and pt completely asym x "a little more fatigued" s palp, cp, sob, presyncope.  Denies excess caffeine or decongestant use Imp RAF rec Increase aspirin to 325 mg one daily but take the coated type> cards eval    08/06/11 - s/p successful RFA   - CHADS-VASc score of 1    09/26/2012  ov/[name] Browning  Comprehensive eval / hyperlipidemia. Chief Complaint  Patient presents with  . Annual Exam    pt denies any concerns at this time   No longer running due to knees, less writing also No sob, palpitations, cough, tia, claudication rec Atenolol 25 mg daily and only use the lorazepam as needed Continue baby aspirin daily and stop your iron tablets   02/27/2013 f/u ov/[name] Browning re: hbp/ hypercholesterol Chief Complaint  Patient presents with  . Annual Exam    Discuss coming off blood pressure medication  Walking 5 x weekly, no longer running. No complaints re bp rx, just"his wife wanted to know if he can come off".  No sob/ tia/ claudication. rec No change rx   06/15/2013 f/u  ov/[name] Browning re: fatigue Chief Complaint  Patient presents with  . Acute Visit    Pt concerned about recent abnormal labs- hemoglobin. He states has been feeling fatigued "for years". Insurance stopped paying for androgel approx 1 month ago and he wonders if this could cause fatigue.    not really clear the fatigue is any worse than usual. On asa 81 mg daily but no obvious blood loss/ abd pain. Did have avms on colonscopy 05/2011 Jarold Motto rec No change recs   D/c zocor around  Early Dec 2015 due to concerns with cognition> some better    02/19/2014 1st [facility] office visit/ [name] Browning  On tenormin 25 mg daily and multiple pysch meds per Dr Westley Chandler who told him she didn't think he needed neuro Chief Complaint  Patient presents with  . Follow-up    Pt states that he started to notice some confusion while on simvastatin, so stopped it approx 1 month ago.  He states that since stopping med confusion is some better. He also c/o non prod cough for the past 1-2 wks.   freq "colds" this past year Most recent cough acute onset x 3 days worse at hs but not productive    No obvious day to day or daytime variabilty or assoc sob or cp or chest tightness, subjective wheeze overt sinus or hb symptoms. No unusual exp hx or  h/o childhood pna/ asthma or knowledge of premature birth.  Sleeping ok without nocturnal  or early am exacerbation  of respiratory  c/o's or need for noct saba. Also denies any obvious fluctuation of symptoms with weather or environmental changes or other aggravating or alleviating factors except as outlined above   Current Medications, Allergies, Complete Past Medical History, Past Surgical History, Family History, and Social History were reviewed in [employer] record.  ROS  The following are not active complaints unless bolded sore throat, dysphagia, dental problems, itching, sneezing,  nasal congestion or excess/ purulent secretions, ear ache,   fever, chills,  sweats, unintended wt loss, pleuritic or exertional cp, hemoptysis,  orthopnea pnd or leg swelling, presyncope, palpitations, heartburn, abdominal pain, anorexia, nausea, vomiting, diarrhea  or change in bowel or urinary habits, change in stools or urine, dysuria,hematuria,  rash, arthralgias, visual complaints, headache, numbness weakness or ataxia or problems with walking or coordination,  change in mood/affect or memory.          Past Medical History:  Adult ADD........................................................................................Marland KitchenKaur  Hyperlipidemia  - Target LDL < 130 male, type A, pos fm hx  hemorrhoidal bleeding.....................................................................Marland KitchenPatterson  - colonoscopy 05/22/2001  And 06/14/11 with AVM's sleep apnea seat CPAP titration to 10 recommended 10/08/2002 for a baseline RDI of 33  - stopped 2007  Testerone def....................................................................................[name] [facility]  Health Maintenance............................................................................Marland KitchenWert  - DT 05/2011  - Pneumovax April 15, 2008  - CPX 02/27/13  DJD Left Knee....................................................................................Marland KitchenGioffree  New A flutter 06/18/2011 ........................................................................[name] [facility]          Objective:   Physical Exam GEN: A/Ox3; pleasant , NAD, slightly anxious  wt 153 February 07, 2008 > > 147 September 15, 2009 > 151 April 30, 2010 > 152 06/08/2011 > 06/18/2011  149> 09/26/2012  146  > 02/27/2013 148 > 06/15/2013  148> 02/19/2014  150  HEENT: [place]/AT, , EACs-clear, TMs-wnl, NOSE-clear, THROAT-clear  NECK: Supple w/ fair ROM; no JVD; normal carotid impulses w/o bruits; no thyromegaly or nodules palpated; no lymphadenopathy.  CHEST   bilateral  trace insp pops/squeaks and exp wheeze s cough RRR, no m/r/g  Pulse 70 and perfectly  regular ABDOMEN: Soft & nt; nml bowel sounds; no organomegaly or masses detected.  EXT: Warm bil, no calf pain, edema, clubbing, pulses ok x L DP reduced  MS nl gait, no deformity Neuro nl sensorium, no tremors, no motor or cerebellar deficits/ full recall of recent events      CXR  02/27/2013 :  No active cardiopulmonary disease   Assessment:

## 2014-02-22 DIAGNOSIS — F1996 Other psychoactive substance use, unspecified with psychoactive substance-induced persisting amnestic disorder: Secondary | ICD-10-CM | POA: Insufficient documentation

## 2014-02-22 HISTORY — DX: Other psychoactive substance use, unspecified with psychoactive substance-induced persisting amnestic disorder: F19.96

## 2014-02-22 NOTE — Assessment & Plan Note (Addendum)
He appeared intact today though we didn't do a formal mmse > will defer to Dr Toy Care whether to involve neuro  Assured pt that my experience with true cognitive impairment is not typically bothersome to the pt and his was and is improved so suspect the statin plus depression probably contributing to a pseudodementia pattern.

## 2014-02-22 NOTE — Assessment & Plan Note (Addendum)
LHC clean in 1994 and he's been quite active but has pos fm hx/ type A personality/ male  He may be able to achieve target ldl with ex/ low chol/ fish oil > advised to recheck in 6 weeks

## 2014-02-22 NOTE — Assessment & Plan Note (Signed)
The most common causes of chronic cough in immunocompetent adults include the following: upper airway cough syndrome (UACS), previously referred to as postnasal drip syndrome (PNDS), which is caused by variety of rhinosinus conditions; (2) asthma; (3) GERD; (4) chronic bronchitis from cigarette smoking or other inhaled environmental irritants; (5) nonasthmatic eosinophilic bronchitis; and (6) bronchiectasis.   These conditions, singly or in combination, have accounted for up to 94% of the causes of chronic cough in prospective studies.   Other conditions have constituted no >6% of the causes in prospective studies These have included bronchogenic carcinoma, chronic interstitial pneumonia, sarcoidosis, left ventricular failure, ACEI-induced cough, and aspiration from a condition associated with pharyngeal dysfunction.    Chronic cough is often simultaneously caused by more than one condition. A single cause has been found from 38 to 82% of the time, multiple causes from 18 to 62%. Multiply caused cough has been the result of three diseases up to 42% of the time.       Suspect this is either uacs or cough variant asthma rec gerd rx/ depomedrol

## 2014-04-02 ENCOUNTER — Encounter: Payer: Self-pay | Admitting: Internal Medicine

## 2014-04-02 ENCOUNTER — Ambulatory Visit (INDEPENDENT_AMBULATORY_CARE_PROVIDER_SITE_OTHER): Payer: Medicare Other | Admitting: Internal Medicine

## 2014-04-02 ENCOUNTER — Other Ambulatory Visit (INDEPENDENT_AMBULATORY_CARE_PROVIDER_SITE_OTHER): Payer: Medicare Other

## 2014-04-02 VITALS — BP 132/80 | HR 73 | Ht 66.0 in | Wt 144.0 lb

## 2014-04-02 DIAGNOSIS — I10 Essential (primary) hypertension: Secondary | ICD-10-CM

## 2014-04-02 DIAGNOSIS — R059 Cough, unspecified: Secondary | ICD-10-CM

## 2014-04-02 DIAGNOSIS — R05 Cough: Secondary | ICD-10-CM | POA: Diagnosis not present

## 2014-04-02 DIAGNOSIS — E785 Hyperlipidemia, unspecified: Secondary | ICD-10-CM | POA: Diagnosis not present

## 2014-04-02 DIAGNOSIS — D638 Anemia in other chronic diseases classified elsewhere: Secondary | ICD-10-CM | POA: Diagnosis not present

## 2014-04-02 LAB — LIPID PANEL
CHOL/HDL RATIO: 4
Cholesterol: 232 mg/dL — ABNORMAL HIGH (ref 0–200)
HDL: 53.7 mg/dL (ref >39.00)
LDL Cholesterol: 153 mg/dL — ABNORMAL HIGH (ref 0–99)
NONHDL: 178.3
Triglycerides: 125 mg/dL (ref 0.0–149.0)
VLDL: 25 mg/dL (ref 0.0–40.0)

## 2014-04-02 LAB — BASIC METABOLIC PANEL
BUN: 18 mg/dL (ref 6–23)
CALCIUM: 9.4 mg/dL (ref 8.4–10.5)
CO2: 31 mEq/L (ref 19–32)
Chloride: 102 mEq/L (ref 96–112)
Creatinine, Ser: 1.26 mg/dL (ref 0.40–1.50)
GFR: 59.61 mL/min — ABNORMAL LOW (ref >60.00)
Glucose, Bld: 109 mg/dL — ABNORMAL HIGH (ref 70–99)
POTASSIUM: 4.2 meq/L (ref 3.5–5.1)
SODIUM: 138 meq/L (ref 135–145)

## 2014-04-02 NOTE — Progress Notes (Signed)
Subjective:     Patient ID: [name] Browning, male   DOB: November 29, 1940   MRN: [id number]   Brief patient profile:  20  yowm quit smoking 1970  with history of hyperlipidemia, allergic rhinitis , and depression.    History of Present Illness  03/2007--presented for CPX-hx of an abnormal EKG leading to a left heart catheter in 1994 that was felt to be completely normal and therefore felt to represent a false positive EKG       06/18/2011 f/u ov/Teila Skalsky cc aflutter noted at @ colonoscopy  06/14/11 and pt completely asym x "a little more fatigued" s palp, cp, sob, presyncope.  Denies excess caffeine or decongestant use Imp RAF rec Increase aspirin to 325 mg one daily but take the coated type> cards eval    08/06/11 - s/p successful RFA   - CHADS-VASc score of 1    09/26/2012  ov/Ines Warf  Comprehensive eval / hyperlipidemia. Chief Complaint  Patient presents with  . Annual Exam    pt denies any concerns at this time   No longer running due to knees, less writing also No sob, palpitations, cough, tia, claudication rec Atenolol 25 mg daily and only use the lorazepam as needed Continue baby aspirin daily and stop your iron tablets    D/c zocor around  Early Dec 2015 due to concerns with cognition> some better    02/19/2014 f/u med/Abeera Flannery  On tenormin 25 mg daily and multiple pysch meds per Dr Westley Chandler who told him she didn't think he needed neuro eval at this point Chief Complaint  Patient presents with  . Follow-up    Pt states that he started to notice some confusion while on simvastatin, so stopped it approx 1 month ago.  He states that since stopping med confusion is some better. He also c/o non prod cough for the past 1-2 wks.   freq "colds" this past year Most recent cough acute onset x 3 days prior to OV   worse at hs but not productive  rec Pepcid ac 20 mg one at bedtime whenever your coughing / clearing throat/ etc until no symptoms for at least at week  Depomedrol 120 mg today should  knock out your cough for now GERD  Diet   Please schedule a follow up office visit in 6 weeks, call sooner if needed fasting to recheck your cholesterol    04/02/2014 f/u ov/Kirti Carl re: hyperlipidemia/ > ? Adverse effect of zocor  Chief Complaint  Patient presents with  . Follow-up    Pt states doing well. His cough has resolved. He has not had any more confusion. No new co's today.   still using peppermint /chocolate but no cough or sob    No tia/ claudication or  cp or chest tightness, subjective wheeze overt sinus or hb symptoms. No unusual exp hx or h/o childhood pna/ asthma or knowledge of premature birth.  Sleeping ok without nocturnal  or early am exacerbation  of respiratory  c/o's or need for noct saba. Also denies any obvious fluctuation of symptoms with weather or environmental changes or other aggravating or alleviating factors except as outlined above   Current Medications, Allergies, Complete Past Medical History, Past Surgical History, Family History, and Social History were reviewed in [employer] record.  ROS  The following are not active complaints unless bolded sore throat, dysphagia, dental problems, itching, sneezing,  nasal congestion or excess/ purulent secretions, ear ache,   fever, chills, sweats, unintended wt loss,  pleuritic or exertional cp, hemoptysis,  orthopnea pnd or leg swelling, presyncope, palpitations, heartburn, abdominal pain, anorexia, nausea, vomiting, diarrhea  or change in bowel or urinary habits, change in stools or urine, dysuria,hematuria,  rash, arthralgias, visual complaints, headache, numbness weakness or ataxia or problems with walking or coordination,  change in mood/affect or memory.          Past Medical History:  Adult ADD........................................................................................Marland KitchenKaur  Hyperlipidemia  - Target LDL < 130 male, type A, pos fm hx  hemorrhoidal  bleeding.....................................................................Marland KitchenPatterson  - colonoscopy 05/22/2001  And 06/14/11 with AVM's sleep apnea seat CPAP titration to 10 recommended 10/08/2002 for a baseline RDI of 33  - stopped 2007  Testerone def....................................................................................[name] [facility]  Health Maintenance............................................................................Marland KitchenWert  - DT 05/2011  - Pneumovax April 15, 2008  - CPX 02/27/13  DJD Left Knee....................................................................................Marland KitchenGioffree  New A flutter 06/18/2011 ........................................................................[name] [place] Cards          Objective:   Physical Exam GEN: A/Ox3; pleasant , NAD, slightly anxious  wt 153 February 07, 2008 > > 147 September 15, 2009 > 151 April 30, 2010 > 152 06/08/2011 > 06/18/2011  149> 09/26/2012  146  > 02/27/2013 148 > 06/15/2013  148> 02/19/2014  150 > 04/02/2014  144 HEENT: [place]/AT, , EACs-clear, TMs-wnl, NOSE-clear, THROAT-clear  NECK: Supple w/ fair ROM; no JVD; normal carotid impulses w/o bruits; no thyromegaly or nodules palpated; no lymphadenopathy.  CHEST   Completely clear to A and P s cough on insp/exp  RRR, no m/r/g  Pulse 70 and perfectly regular ABDOMEN: Soft & nt; nml bowel sounds; no organomegaly or masses detected.  EXT: Warm bil, no calf pain, edema, clubbing, pulses ok x L DP reduced  MS nl gait, no deformity Neuro nl sensorium, no tremors, no motor or cerebellar deficits/ full recall of recent events      CXR  02/27/2013 :  No active cardiopulmonary disease   Assessment:

## 2014-04-02 NOTE — Patient Instructions (Addendum)
All follow up on Vit D per Dr Toy Care re levels and recommendations for treatment unless/until she turns it over to me   Please remember to go to the lab department downstairs for your tests - we will call you with the results when they are available.   Late add : restart zocor at 20 mg per day and observe for any ams and if so d/c and either way change f/u to 3 m

## 2014-04-02 NOTE — Assessment & Plan Note (Signed)
Resolved with gerd rx/ should continue dietary restrictions only

## 2014-04-02 NOTE — Assessment & Plan Note (Signed)
-   Target LDL < 130 male, type A, pos fm hx  - stopped statin 01/2014 due to concerns re memory >  LDL up to 153  04/02/2014 > rec resume zocor at half prev dose = 20 mg daily and observe for any new ams and if so ok to dc

## 2014-04-02 NOTE — Assessment & Plan Note (Signed)
-   Fe studies ok 06/15/13 > stool cards received 06/27/2013 > neg x 5> down to 11.4 08/23/13  > referred for hematology 09/04/2013> Alvy Bimler felt it was due to low Testosterone  No need for Fe rx

## 2014-04-02 NOTE — Assessment & Plan Note (Signed)
Adequate control on present rx, reviewed > no change in rx needed   

## 2014-04-03 ENCOUNTER — Other Ambulatory Visit: Payer: Self-pay | Admitting: Internal Medicine

## 2014-04-03 MED ORDER — SIMVASTATIN 40 MG PO TABS: 20.0000 mg | ORAL_TABLET | Freq: Every day | ORAL | Status: DC

## 2014-04-03 NOTE — Progress Notes (Signed)
Quick Note:  Spoke with the pt's spouse and notified of results/recs per MW  She verbalized understanding  I have sent in rx for zocor  3 month reminder was placed ______

## 2014-05-10 DIAGNOSIS — H903 Sensorineural hearing loss, bilateral: Secondary | ICD-10-CM | POA: Diagnosis not present

## 2014-05-10 DIAGNOSIS — H6122 Impacted cerumen, left ear: Secondary | ICD-10-CM | POA: Diagnosis not present

## 2014-06-03 ENCOUNTER — Encounter: Payer: Self-pay | Admitting: Gastroenterology

## 2014-07-22 ENCOUNTER — Ambulatory Visit (INDEPENDENT_AMBULATORY_CARE_PROVIDER_SITE_OTHER)
Admission: RE | Admit: 2014-07-22 | Discharge: 2014-07-22 | Disposition: A | Discharge status: Home / Self Care | Payer: Medicare Other | Source: Ambulatory Visit | Attending: Internal Medicine | Admitting: Internal Medicine

## 2014-07-22 ENCOUNTER — Encounter: Payer: Self-pay | Admitting: Internal Medicine

## 2014-07-22 ENCOUNTER — Other Ambulatory Visit (INDEPENDENT_AMBULATORY_CARE_PROVIDER_SITE_OTHER): Payer: Medicare Other

## 2014-07-22 ENCOUNTER — Ambulatory Visit (INDEPENDENT_AMBULATORY_CARE_PROVIDER_SITE_OTHER): Payer: Medicare Other | Admitting: Internal Medicine

## 2014-07-22 ENCOUNTER — Telehealth: Payer: Self-pay | Admitting: Internal Medicine

## 2014-07-22 VITALS — BP 126/66 | HR 63 | Ht 66.0 in | Wt 146.0 lb

## 2014-07-22 DIAGNOSIS — I4892 Unspecified atrial flutter: Secondary | ICD-10-CM | POA: Diagnosis not present

## 2014-07-22 DIAGNOSIS — Z23 Encounter for immunization: Secondary | ICD-10-CM

## 2014-07-22 DIAGNOSIS — I10 Essential (primary) hypertension: Secondary | ICD-10-CM

## 2014-07-22 DIAGNOSIS — E785 Hyperlipidemia, unspecified: Secondary | ICD-10-CM | POA: Diagnosis not present

## 2014-07-22 DIAGNOSIS — F1996 Other psychoactive substance use, unspecified with psychoactive substance-induced persisting amnestic disorder: Secondary | ICD-10-CM | POA: Diagnosis not present

## 2014-07-22 DIAGNOSIS — R011 Cardiac murmur, unspecified: Secondary | ICD-10-CM | POA: Diagnosis not present

## 2014-07-22 DIAGNOSIS — D638 Anemia in other chronic diseases classified elsewhere: Secondary | ICD-10-CM | POA: Diagnosis not present

## 2014-07-22 LAB — BASIC METABOLIC PANEL
BUN: 19 mg/dL (ref 6–23)
CALCIUM: 9.5 mg/dL (ref 8.4–10.5)
CHLORIDE: 102 meq/L (ref 96–112)
CO2: 30 mEq/L (ref 19–32)
Creatinine, Ser: 1.21 mg/dL (ref 0.40–1.50)
GFR: 62.41 mL/min (ref >60.00)
GLUCOSE: 106 mg/dL — AB (ref 70–99)
Potassium: 4.1 mEq/L (ref 3.5–5.1)
SODIUM: 139 meq/L (ref 135–145)

## 2014-07-22 LAB — CBC WITH DIFFERENTIAL/PLATELET
Basophils Absolute: 0 10*3/uL (ref 0.0–0.1)
Basophils Relative: 0.2 % (ref 0.0–3.0)
EOS PCT: 3.2 % (ref 0.0–5.0)
Eosinophils Absolute: 0.3 10*3/uL (ref 0.0–0.7)
HCT: 38.2 % — ABNORMAL LOW (ref 39.0–52.0)
Hemoglobin: 13 g/dL (ref 13.0–17.0)
Lymphocytes Relative: 19.8 % (ref 12.0–46.0)
Lymphs Abs: 1.7 10*3/uL (ref 0.7–4.0)
MCHC: 34 g/dL (ref 30.0–36.0)
MCV: 91.7 fl (ref 78.0–100.0)
Monocytes Absolute: 0.7 10*3/uL (ref 0.1–1.0)
Monocytes Relative: 8.6 % (ref 3.0–12.0)
Neutro Abs: 5.9 10*3/uL (ref 1.4–7.7)
Neutrophils Relative %: 68.2 % (ref 43.0–77.0)
Platelets: 290 10*3/uL (ref 150.0–400.0)
RBC: 4.17 Mil/uL — AB (ref 4.22–5.81)
RDW: 12.9 % (ref 11.5–15.5)
WBC: 8.6 10*3/uL (ref 4.0–10.5)

## 2014-07-22 LAB — LIPID PANEL
Cholesterol: 181 mg/dL (ref 0–200)
HDL: 49.6 mg/dL (ref >39.00)
LDL Cholesterol: 107 mg/dL — ABNORMAL HIGH (ref 0–99)
NONHDL: 131.4
Total CHOL/HDL Ratio: 4
Triglycerides: 123 mg/dL (ref 0.0–149.0)
VLDL: 24.6 mg/dL (ref 0.0–40.0)

## 2014-07-22 LAB — HEPATIC FUNCTION PANEL
ALBUMIN: 4.4 g/dL (ref 3.5–5.2)
ALT: 17 U/L (ref 0–53)
AST: 16 U/L (ref 0–37)
Alkaline Phosphatase: 72 U/L (ref 39–117)
BILIRUBIN DIRECT: 0.1 mg/dL (ref 0.0–0.3)
Total Bilirubin: 0.6 mg/dL (ref 0.2–1.2)
Total Protein: 7.3 g/dL (ref 6.0–8.3)

## 2014-07-22 LAB — TSH: TSH: 1.85 u[IU]/mL (ref 0.35–4.50)

## 2014-07-22 NOTE — Progress Notes (Signed)
Subjective:     Patient ID: [name] Browning, male   DOB: February 03, 1941   MRN: [id number]   Brief patient profile:  69  yowm quit smoking 1970  with history of hyperlipidemia, allergic rhinitis , and depression.    History of Present Illness  03/2007--presented for CPX-hx of an abnormal EKG leading to a left heart catheter in 1994 that was felt to be completely normal and therefore felt to represent a false positive EKG       06/18/2011 f/u ov/Clovis Mankins cc aflutter noted at @ colonoscopy  06/14/11 and pt completely asym x "a little more fatigued" s palp, cp, sob, presyncope.  Denies excess caffeine or decongestant use Imp RAF rec Increase aspirin to 325 mg one daily but take the coated type> cards eval    08/06/11 - s/p successful RFA   - CHADS-VASc score of 1    D/c zocor around  Early Dec 2015 due to concerns with cognition> some better    04/02/2014 f/u ov/Aldean Suddeth re: hyperlipidemia/ > ? Adverse effect of zocor  Chief Complaint  Patient presents with  . Follow-up    Pt states doing well. His cough has resolved. He has not had any more confusion. No new co's today.   still using peppermint /chocolate but no cough or sob  rec All follow up on Vit D per Dr Evelene Croon re levels and recommendations for treatment unless/until she turns it over to me  Late add : restart zocor at 20 mg per day and observe for any ams       07/22/2014 f/u ov/Stewart Sasaki re: hyperlipidemia/ h/o afib/ on androgel s urology f/u  Chief Complaint  Patient presents with  . Follow-up    Pt has no complaints today- feels well.       No change in memory / back on zocor 20 mg daily   No tia/ claudication or  cp or chest tightness, subjective wheeze overt sinus or hb symptoms. No unusual exp hx or h/o childhood pna/ asthma or knowledge of premature birth.  Sleeping ok without nocturnal  or early am exacerbation  of respiratory  c/o's or need for noct saba. Also denies any obvious fluctuation of symptoms with weather or  environmental changes or other aggravating or alleviating factors except as outlined above   Current Medications, Allergies, Complete Past Medical History, Past Surgical History, Family History, and Social History were reviewed in [employer] record.  ROS  The following are not active complaints unless bolded sore throat, dysphagia, dental problems, itching, sneezing,  nasal congestion or excess/ purulent secretions, ear ache,   fever, chills, sweats, unintended wt loss, pleuritic or exertional cp, hemoptysis,  orthopnea pnd or leg swelling, presyncope, palpitations, heartburn, abdominal pain, anorexia, nausea, vomiting, diarrhea  or change in bowel or urinary habits, change in stools or urine, dysuria,hematuria,  rash, arthralgias, visual complaints, headache, numbness weakness or ataxia or problems with walking or coordination,  change in mood/affect or memory.          Past Medical History:  Adult ADD........................................................................................Marland KitchenKaur  Hyperlipidemia  - Target LDL < 130 male, type A, pos fm hx  hemorrhoidal bleeding.....................................................................Marland KitchenPatterson  - colonoscopy 05/22/2001  And 06/14/11 with AVM's sleep apnea seat CPAP titration to 10 recommended 10/08/2002 for a baseline RDI of 33  - stopped 2007  Testerone def...................................................................................[name]  [facility]  Health Maintenance............................................................................Marland KitchenWert  - DT 05/2011  - Pneumovax April 15, 2008,  Prevnar 13 07/22/2014   - CPX 02/27/13  DJD Left  Knee....................................................................................Marland KitchenGioffree  New A flutter 06/18/2011 ........................................................................[name] [place] Cards          Objective:   Physical Exam GEN: A/Ox3; pleasant , NAD,  slightly anxious   wt 153 February 07, 2008 > > 147 September 15, 2009 > 151 April 30, 2010 > 152 06/08/2011 > 06/18/2011  149> 09/26/2012  146  > 02/27/2013 148 > 06/15/2013  148> 02/19/2014  150 > 04/02/2014  144> 07/22/14  146  HEENT: [place]/AT, , EACs-clear, TMs-wnl, NOSE-clear, THROAT-clear  NECK: Supple w/ fair ROM; no JVD; normal carotid impulses w/o bruits; no thyromegaly or nodules palpated; no lymphadenopathy.  CHEST   Completely clear to A and P   RRR, no m/r/g  Pulse 70 and perfectly regular ABDOMEN: Soft & nt; nml bowel sounds; no organomegaly or masses detected.  EXT: Warm bil, no calf pain, edema, clubbing, pulses ok x L DP reduced MS nl gait, no deformity Neuro nl sensorium, no tremors, no motor or cerebellar deficits/ full recall of recent events      CXR PA and Lateral:   07/22/2014 :     I personally reviewed images and agree with radiology impression as follows:    Mediastinum and hilar structures are normal. Prominent cardiac fat pad. Lungs are clear. No pleural effusion or pneumothorax. Heart size normal. No acute bony abnormality.   . Labs ordered/ reviewed:   Lab 07/22/14 1040  NA 139  K 4.1  CL 102  CO2 30  BUN 19  CREATININE 1.21  GLUCOSE 106*     Lab 07/22/14 1040  HGB 13.0  HCT 38.2*  WBC 8.6  PLT 290.0     Lab Results  Component Value Date   TSH 1.85 07/22/2014           Assessment:

## 2014-07-22 NOTE — Patient Instructions (Signed)
Please remember to go to the lab and x-ray department downstairs for your tests - we will call you with the results when they are available.    Be sure to call [facility] re follow up   Prevnar-13 today   Please schedule a follow up visit in 6 months but call sooner if needed

## 2014-07-22 NOTE — Telephone Encounter (Signed)
Spoke with patients wife-states they were concerned when they seen dx of anemia on the check out paper and wanted to make sure if anything else needed to be done. Pt's wife is aware that MW did order labs today and will treat accordingly if needed. Pt's wife was happy to know that MW is keeping an eye on the patient and treating well. Nothing more needed at this time.

## 2014-07-23 ENCOUNTER — Telehealth: Payer: Self-pay | Admitting: Internal Medicine

## 2014-07-23 NOTE — Telephone Encounter (Signed)
Called and spoke to pt's wife, Barnetta Chapel. Informed Barnetta Chapel of the results of labs and CXR. Barnetta Chapel verbalized understanding and denied any further questions or concerns at this time.

## 2014-07-23 NOTE — Telephone Encounter (Signed)
lmtcb x 2  Notes Recorded by Glean Hess, CMA on 07/23/2014 at 8:41 AM Lmtcb. Notes Recorded by Tanda Rockers, MD on 07/23/2014 at 5:56 AM Call patient : Studies are unremarkable, no change in recs

## 2014-07-23 NOTE — Progress Notes (Signed)
Quick Note:  Called and spoke to pt's wife, Barnetta Chapel. Informed Barnetta Chapel of the results of labs and CXR. Barnetta Chapel verbalized understanding and denied any further questions or concerns at this time. ______

## 2014-07-23 NOTE — Telephone Encounter (Signed)
Pt returning call.[name] Browning ° °

## 2014-07-29 ENCOUNTER — Emergency Department (HOSPITAL_COMMUNITY)
Admission: EM | Admit: 2014-07-29 | Discharge: 2014-07-29 | Disposition: A | Discharge status: Home / Self Care | Payer: Medicare Other | Attending: Emergency Medicine | Admitting: Emergency Medicine

## 2014-07-29 ENCOUNTER — Encounter: Payer: Self-pay | Admitting: Internal Medicine

## 2014-07-29 ENCOUNTER — Encounter (HOSPITAL_COMMUNITY): Payer: Self-pay | Admitting: Emergency Medicine

## 2014-07-29 DIAGNOSIS — D1809 Hemangioma of other sites: Secondary | ICD-10-CM | POA: Insufficient documentation

## 2014-07-29 DIAGNOSIS — R58 Hemorrhage, not elsewhere classified: Secondary | ICD-10-CM

## 2014-07-29 DIAGNOSIS — Z9889 Other specified postprocedural states: Secondary | ICD-10-CM | POA: Diagnosis not present

## 2014-07-29 DIAGNOSIS — R011 Cardiac murmur, unspecified: Secondary | ICD-10-CM | POA: Diagnosis not present

## 2014-07-29 DIAGNOSIS — F329 Major depressive disorder, single episode, unspecified: Secondary | ICD-10-CM | POA: Diagnosis not present

## 2014-07-29 DIAGNOSIS — F919 Conduct disorder, unspecified: Secondary | ICD-10-CM | POA: Insufficient documentation

## 2014-07-29 DIAGNOSIS — Z87891 Personal history of nicotine dependence: Secondary | ICD-10-CM | POA: Insufficient documentation

## 2014-07-29 DIAGNOSIS — Y838 Other surgical procedures as the cause of abnormal reaction of the patient, or of later complication, without mention of misadventure at the time of the procedure: Secondary | ICD-10-CM | POA: Diagnosis not present

## 2014-07-29 DIAGNOSIS — M1710 Unilateral primary osteoarthritis, unspecified knee: Secondary | ICD-10-CM | POA: Diagnosis not present

## 2014-07-29 DIAGNOSIS — F419 Anxiety disorder, unspecified: Secondary | ICD-10-CM | POA: Diagnosis not present

## 2014-07-29 DIAGNOSIS — H269 Unspecified cataract: Secondary | ICD-10-CM | POA: Insufficient documentation

## 2014-07-29 DIAGNOSIS — L7621 Postprocedural hemorrhage and hematoma of skin and subcutaneous tissue following a dermatologic procedure: Secondary | ICD-10-CM | POA: Insufficient documentation

## 2014-07-29 DIAGNOSIS — Z79899 Other long term (current) drug therapy: Secondary | ICD-10-CM | POA: Insufficient documentation

## 2014-07-29 DIAGNOSIS — E785 Hyperlipidemia, unspecified: Secondary | ICD-10-CM | POA: Insufficient documentation

## 2014-07-29 DIAGNOSIS — Z7982 Long term (current) use of aspirin: Secondary | ICD-10-CM | POA: Insufficient documentation

## 2014-07-29 NOTE — Assessment & Plan Note (Signed)
Adequate control on present rx, reviewed > no change in rx needed   

## 2014-07-29 NOTE — Assessment & Plan Note (Signed)
-   Target LDL < 130 male, type A, pos fm hx  - stopped statin 01/2014 due to concerns re memory >  LDL up to 153  04/02/2014 > rec resume zocor at half prev dose = 20 mg daily   Lab Results  Component Value Date   CHOL 181 07/22/2014   HDL 49.60 07/22/2014   LDLCALC 107* 07/22/2014   TRIG 123.0 07/22/2014   CHOLHDL 4 07/22/2014    Adequate control on present rx, reviewed > no change in rx needed  , no need to push higher statin doses given previous concerns with ams

## 2014-07-29 NOTE — ED Notes (Signed)
Pt verbalizes understanding of d/c instructions and denies any further need at this time. 

## 2014-07-29 NOTE — Assessment & Plan Note (Signed)
-   Fe studies ok 06/15/13 > stool cards received 06/27/2013 > neg x 5> down to 11.4 08/23/13  > referred for hematology 09/04/2013> Alvy Bimler felt it was due to low Testosterone   Recent Labs Lab 07/22/14 1040  HGB 13.0     Resolved

## 2014-07-29 NOTE — ED Notes (Signed)
Pt reports hx of busted blood vessel in lip that he has had to have repaired before. sts usually if it states bleeding he is able to control bleeding at home but today having difficulty getting bleeding to stop. Denies injury or pain.

## 2014-07-29 NOTE — ED Provider Notes (Signed)
CSN: [id number]     Arrival date & time 07/29/14  1501 History  This chart was scribed for non-physician practitioner, Glendell Docker, NP, working with Alfonzo Beers, MD, by Stephania Fragmin, ED Scribe. This patient was seen in room TR07C/TR07C and the patient's care was started at 3:23 PM.    No chief complaint on file.  The history is provided by the patient. No language interpreter was used.   HPI Comments: [name] Browning is a 74 y.o. male who presents to the Emergency Department complaining of constant lip bleeding that occurred PTA. He reports he has seen a dermatologist and had a blood blistered on the top of his lip removed repeatedly, but it keeps coming back. It usually bleeds, and he is able to stop it at home; however, he has not been able to control it today until he came here. He denies any known trauma. He denies being on blood thinners, although he takes a daily aspirin 81 mg.   Past Medical History  Diagnosis Date  . ADD (attention deficit disorder)   . Hyperlipidemia   . DJD (degenerative joint disease) of knee   . Depression   . Anxiety   . Cataracts, bilateral   . Heart murmur     a. 06/2011 Echo: 65-70%, mild MR  . Sleep apnea     could not tolerate cpap  . Atrial flutter     a. s/p RFCA 07/2011.  Xarelto d/c 08/2011.  . S/P cardiac cath     a. in 1990's - Dr. Gwenlyn Found - WNL  . Conjunctivitis 09/14/2013   Past Surgical History  Procedure Laterality Date  . Wrist fracture surgery  2007    left  . Atrial flutter ablation N/A 08/06/2011    Procedure: ATRIAL FLUTTER ABLATION;  Surgeon: Deboraha Sprang, MD;  Location: [facility] CATH LAB;  Service: Cardiovascular;  Laterality: N/A;   Family History  Problem Relation Age of Onset  . Prostate cancer Father   . Heart disease Mother   . Leukemia Mother   . Colon cancer Neg Hx   . Stomach cancer Neg Hx    History  Substance Use Topics  . Smoking status: Former Smoker    Types: Cigarettes    Quit date: 02/16/1968  . Smokeless  tobacco: Never Used  . Alcohol Use: No    Review of Systems  All other systems reviewed and are negative.     Allergies  Review of patient's allergies indicates no known allergies.  Home Medications   Prior to Admission medications   Medication Sig Start Date End Date Taking? Authorizing Provider  amphetamine-dextroamphetamine (ADDERALL XR) 30 MG 24 hr capsule Take 30 mg by mouth daily.    Historical Provider, MD  aspirin 81 MG tablet Take 81 mg by mouth daily.    Historical Provider, MD  atenolol (TENORMIN) 25 MG tablet TAKE 1 TABLET (25 MG TOTAL) BY MOUTH DAILY.    Tanda Rockers, MD  Calcium Carbonate-Vit D-Min (CALCIUM 1200 PO) Take 1 tablet by mouth daily.    Historical Provider, MD  LORazepam (ATIVAN) 1 MG tablet Take 1 mg by mouth every 8 (eight) hours.    Historical Provider, MD  Omega-3 Fatty Acids (FISH OIL PO) Take 1 capsule by mouth daily.    Historical Provider, MD  potassium chloride (K-DUR,KLOR-CON) 10 MEQ tablet Take 10 mEq by mouth daily.    Historical Provider, MD  psyllium (METAMUCIL) 58.6 % powder Take 1 packet by mouth daily.  Historical Provider, MD  RAPAFLO 8 MG CAPS capsule Take 8 mg by mouth daily with breakfast.  08/24/13   Historical Provider, MD  simvastatin (ZOCOR) 40 MG tablet Take 0.5 tablets (20 mg total) by mouth at bedtime. 04/03/14   Tanda Rockers, MD  testosterone (ANDROGEL) 50 MG/5GM (1%) GEL Place 5 g onto the skin daily.    Historical Provider, MD  traZODone (DESYREL) 50 MG tablet Take 100 mg by mouth at bedtime.     Historical Provider, MD  Vitamin D, Ergocalciferol, (DRISDOL) 50000 UNITS CAPS capsule Take 50,000 Units by mouth every 7 (seven) days.    Historical Provider, MD  vitamin E 400 UNIT capsule Take 400 Units by mouth daily.    Historical Provider, MD   BP 155/78 mmHg  Pulse 62  Temp(Src) 98 F (36.7 C) (Oral)  Resp 14  Ht 5\' 6"  (1.676 m)  Wt 145 lb (65.772 kg)  BMI 23.41 kg/m2  SpO2 98% Physical Exam  Constitutional: He is  oriented to person, place, and time. He appears well-developed and well-nourished. No distress.  HENT:  Head: Normocephalic and atraumatic.  Dark area noted to the center of the upper lip. No bleeding noted at this time  Eyes: Conjunctivae and EOM are normal.  Neck: Neck supple. No tracheal deviation present.  Cardiovascular: Normal rate.   Pulmonary/Chest: Effort normal. No respiratory distress.  Musculoskeletal: Normal range of motion.  Neurological: He is alert and oriented to person, place, and time.  Psychiatric: He has a normal mood and affect. His behavior is normal.  Nursing note and vitals reviewed.   ED Course  Procedures (including critical care time)  DIAGNOSTIC STUDIES: Oxygen Saturation is 98% on RA, normal by my interpretation.    COORDINATION OF CARE: 3:26 PM - Discussed treatment plan with pt at bedside, and pt agreed to plan.  MDM   Final diagnoses:  Bleeding   Small hemangioma to the upper lip. Bleeding is under control. Nothing to be done at this time  I personally performed the services described in this documentation, which was scribed in my presence. The recorded information has been reviewed and is accurate.     Glendell Docker, NP 07/29/14 [place], MD 07/29/14 [phone]

## 2014-07-29 NOTE — Assessment & Plan Note (Signed)
D/c statin 01/2014 > improved   Not clear this was related to statis but no problems back on 20 mg per day > continue to monitor

## 2014-07-30 DIAGNOSIS — D1809 Hemangioma of other sites: Secondary | ICD-10-CM | POA: Diagnosis not present

## 2014-07-30 DIAGNOSIS — Q279 Congenital malformation of peripheral vascular system, unspecified: Secondary | ICD-10-CM | POA: Diagnosis not present

## 2014-07-31 ENCOUNTER — Encounter (HOSPITAL_BASED_OUTPATIENT_CLINIC_OR_DEPARTMENT_OTHER): Payer: Self-pay | Admitting: *Deleted

## 2014-08-01 NOTE — H&P (Signed)
  Subjective:    Patient ID: [name] Browning is a 74 y.o. male.  HPI Referred from [facility] ED following bleeding episode upper lip lesion. Has had vascular lesion present for several years and has been treated with cautery multiple times. Lesion still present and has had two episodes bleeding significant over last week. Has never had surgical excision.   Review of Systems     Objective:   Physical Exam  Cardiovascular: Normal rate, regular rhythm and normal heart sounds.  Pulmonary/Chest: Effort normal and breath sounds normal.  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 Recommend excision of lesions as has failed conservative treatment. Will request record from Dr. Ronnald Ramp. Counseled excision of lesions will cause blunting of Cupids bow. Given what he reports as significant bleeding, would prefer to do procedure in OR. Plan OP procedure, reviewed post procedure limitations, sutures. They are traveling starting this weekend and would like to do before they leave. Counseled we will try but in event of bleeding on trip would carry guaze, they also asked about silver nitrate and this is ok, though firm pressure most helpful. Pictures taken.      Irene Limbo, MD [facility] [phone]

## 2014-08-02 ENCOUNTER — Encounter (HOSPITAL_BASED_OUTPATIENT_CLINIC_OR_DEPARTMENT_OTHER)
Admission: RE | Disposition: A | Discharge status: Home / Self Care | Payer: Self-pay | Source: Ambulatory Visit | Attending: Plastic Surgery

## 2014-08-02 ENCOUNTER — Ambulatory Visit (HOSPITAL_BASED_OUTPATIENT_CLINIC_OR_DEPARTMENT_OTHER): Payer: Medicare Other | Admitting: Anesthesiology

## 2014-08-02 ENCOUNTER — Ambulatory Visit (HOSPITAL_BASED_OUTPATIENT_CLINIC_OR_DEPARTMENT_OTHER)
Admission: RE | Admit: 2014-08-02 | Discharge: 2014-08-02 | Disposition: A | Discharge status: Home / Self Care | Payer: Medicare Other | Source: Ambulatory Visit | Attending: Plastic Surgery | Admitting: Plastic Surgery

## 2014-08-02 ENCOUNTER — Encounter (HOSPITAL_BASED_OUTPATIENT_CLINIC_OR_DEPARTMENT_OTHER): Payer: Self-pay | Admitting: *Deleted

## 2014-08-02 DIAGNOSIS — M1990 Unspecified osteoarthritis, unspecified site: Secondary | ICD-10-CM | POA: Diagnosis not present

## 2014-08-02 DIAGNOSIS — K130 Diseases of lips: Secondary | ICD-10-CM | POA: Diagnosis not present

## 2014-08-02 DIAGNOSIS — I10 Essential (primary) hypertension: Secondary | ICD-10-CM | POA: Insufficient documentation

## 2014-08-02 DIAGNOSIS — Q279 Congenital malformation of peripheral vascular system, unspecified: Secondary | ICD-10-CM | POA: Diagnosis not present

## 2014-08-02 DIAGNOSIS — Q380 Congenital malformations of lips, not elsewhere classified: Secondary | ICD-10-CM | POA: Diagnosis not present

## 2014-08-02 DIAGNOSIS — I4891 Unspecified atrial fibrillation: Secondary | ICD-10-CM | POA: Insufficient documentation

## 2014-08-02 DIAGNOSIS — G4730 Sleep apnea, unspecified: Secondary | ICD-10-CM | POA: Diagnosis not present

## 2014-08-02 DIAGNOSIS — D230 Other benign neoplasm of skin of lip: Secondary | ICD-10-CM | POA: Diagnosis not present

## 2014-08-02 DIAGNOSIS — Z87891 Personal history of nicotine dependence: Secondary | ICD-10-CM | POA: Diagnosis not present

## 2014-08-02 HISTORY — DX: Cardiac arrhythmia, unspecified: I49.9

## 2014-08-02 HISTORY — PX: LESION REMOVAL: SHX5196

## 2014-08-02 LAB — POCT HEMOGLOBIN-HEMACUE: Hemoglobin: 13.6 g/dL (ref 13.0–17.0)

## 2014-08-02 SURGERY — WIDE EXCISION, LESION, UPPER EXTREMITY
Anesthesia: General | Site: Face

## 2014-08-02 MED ORDER — FENTANYL CITRATE (PF) 100 MCG/2ML IJ SOLN
INTRAMUSCULAR | Status: AC
  Filled 2014-08-02: qty 6

## 2014-08-02 MED ORDER — DEXAMETHASONE SODIUM PHOSPHATE 4 MG/ML IJ SOLN
INTRAMUSCULAR | Status: DC | PRN
  Administered 2014-08-02: 8 mg via INTRAVENOUS

## 2014-08-02 MED ORDER — BACITRACIN ZINC 500 UNIT/GM EX OINT
TOPICAL_OINTMENT | CUTANEOUS | Status: AC
  Filled 2014-08-02: qty 0.9

## 2014-08-02 MED ORDER — SCOPOLAMINE 1 MG/3DAYS TD PT72: 1.0000 | MEDICATED_PATCH | Freq: Once | TRANSDERMAL | Status: DC | PRN

## 2014-08-02 MED ORDER — MIDAZOLAM HCL 2 MG/2ML IJ SOLN
INTRAMUSCULAR | Status: AC
  Filled 2014-08-02: qty 2

## 2014-08-02 MED ORDER — LIDOCAINE HCL (CARDIAC) 20 MG/ML IV SOLN
INTRAVENOUS | Status: DC | PRN
  Administered 2014-08-02: 50 mg via INTRAVENOUS

## 2014-08-02 MED ORDER — OXYCODONE HCL 5 MG PO TABS: 5.0000 mg | ORAL_TABLET | Freq: Once | ORAL | Status: DC | PRN

## 2014-08-02 MED ORDER — ACETAMINOPHEN 160 MG/5ML PO SOLN: 960.0000 mg | Freq: Once | ORAL | Status: DC

## 2014-08-02 MED ORDER — LIDOCAINE-EPINEPHRINE 1 %-1:100000 IJ SOLN
INTRAMUSCULAR | Status: AC
  Filled 2014-08-02: qty 1

## 2014-08-02 MED ORDER — HYDROCODONE-ACETAMINOPHEN 5-325 MG PO TABS
1.0000 | ORAL_TABLET | ORAL | Status: DC | PRN
Start: 2014-08-02 — End: 2014-11-04

## 2014-08-02 MED ORDER — OXYCODONE HCL 5 MG/5ML PO SOLN: 5.0000 mg | Freq: Once | ORAL | Status: DC | PRN

## 2014-08-02 MED ORDER — MIDAZOLAM HCL 2 MG/2ML IJ SOLN: 1.0000 mg | INTRAMUSCULAR | Status: DC | PRN

## 2014-08-02 MED ORDER — CEFAZOLIN SODIUM-DEXTROSE 2-3 GM-% IV SOLR
2.0000 g | INTRAVENOUS | Status: AC
  Administered 2014-08-02: 2 g via INTRAVENOUS

## 2014-08-02 MED ORDER — GLYCOPYRROLATE 0.2 MG/ML IJ SOLN
INTRAMUSCULAR | Status: DC | PRN
  Administered 2014-08-02: 0.3 mg via INTRAVENOUS

## 2014-08-02 MED ORDER — BACITRACIN-NEOMYCIN-POLYMYXIN 400-5-5000 EX OINT
TOPICAL_OINTMENT | CUTANEOUS | Status: DC | PRN
  Administered 2014-08-02: 1 via TOPICAL

## 2014-08-02 MED ORDER — LACTATED RINGERS IV SOLN
INTRAVENOUS | Status: DC
  Administered 2014-08-02: 10:00:00 via INTRAVENOUS

## 2014-08-02 MED ORDER — ONDANSETRON HCL 4 MG/2ML IJ SOLN
INTRAMUSCULAR | Status: DC | PRN
  Administered 2014-08-02: 4 mg via INTRAVENOUS

## 2014-08-02 MED ORDER — LIDOCAINE-EPINEPHRINE 1 %-1:100000 IJ SOLN
INTRAMUSCULAR | Status: DC | PRN
  Administered 2014-08-02: 3 mL

## 2014-08-02 MED ORDER — CEFAZOLIN SODIUM-DEXTROSE 2-3 GM-% IV SOLR
INTRAVENOUS | Status: AC
  Filled 2014-08-02: qty 50

## 2014-08-02 MED ORDER — PROPOFOL 10 MG/ML IV BOLUS
INTRAVENOUS | Status: DC | PRN
  Administered 2014-08-02: 150 mg via INTRAVENOUS

## 2014-08-02 MED ORDER — EPHEDRINE SULFATE 50 MG/ML IJ SOLN
INTRAMUSCULAR | Status: DC | PRN
  Administered 2014-08-02: 10 mg via INTRAVENOUS

## 2014-08-02 MED ORDER — FENTANYL CITRATE (PF) 100 MCG/2ML IJ SOLN
INTRAMUSCULAR | Status: DC | PRN
  Administered 2014-08-02: 50 ug via INTRAVENOUS

## 2014-08-02 MED ORDER — ONDANSETRON HCL 4 MG/2ML IJ SOLN: 4.0000 mg | Freq: Four times a day (QID) | INTRAMUSCULAR | Status: DC | PRN

## 2014-08-02 MED ORDER — ACETAMINOPHEN 500 MG PO TABS: 1000.0000 mg | ORAL_TABLET | Freq: Once | ORAL | Status: DC

## 2014-08-02 MED ORDER — FENTANYL CITRATE (PF) 100 MCG/2ML IJ SOLN: 25.0000 ug | INTRAMUSCULAR | Status: DC | PRN

## 2014-08-02 MED ORDER — BUPIVACAINE-EPINEPHRINE (PF) 0.25% -1:200000 IJ SOLN
INTRAMUSCULAR | Status: AC
  Filled 2014-08-02: qty 30

## 2014-08-02 SURGICAL SUPPLY — 43 items
BALL CTTN LRG ABS STRL LF (GAUZE/BANDAGES/DRESSINGS)
BLADE CLIPPER SURG (BLADE) IMPLANT
BLADE SURG 15 STRL LF DISP TIS (BLADE) ×1 IMPLANT
BLADE SURG 15 STRL SS (BLADE) ×1
COTTONBALL LRG STERILE PKG (GAUZE/BANDAGES/DRESSINGS) IMPLANT
COVER BACK TABLE 60X90IN (DRAPES) ×2 IMPLANT
COVER MAYO STAND STRL (DRAPES) ×2 IMPLANT
DECANTER SPIKE VIAL GLASS SM (MISCELLANEOUS) IMPLANT
DRAPE U-SHAPE 76X120 STRL (DRAPES) IMPLANT
ELECT COATED BLADE 2.86 ST (ELECTRODE) IMPLANT
ELECT NEEDLE BLADE 2-5/6 (NEEDLE) ×2 IMPLANT
ELECT REM PT RETURN 9FT ADLT (ELECTROSURGICAL) ×2
ELECTRODE REM PT RTRN 9FT ADLT (ELECTROSURGICAL) ×1 IMPLANT
GAUZE XEROFORM 1X8 LF (GAUZE/BANDAGES/DRESSINGS) IMPLANT
GAUZE XEROFORM 5X9 LF (GAUZE/BANDAGES/DRESSINGS) IMPLANT
GLOVE BIO SURGEON STRL SZ 6 (GLOVE) ×2 IMPLANT
GLOVE BIO SURGEON STRL SZ 6.5 (GLOVE) IMPLANT
GLOVE BIOGEL PI IND STRL 7.0 (GLOVE) ×1 IMPLANT
GLOVE BIOGEL PI IND STRL 7.5 (GLOVE) ×1 IMPLANT
GLOVE BIOGEL PI INDICATOR 7.0 (GLOVE) ×1
GLOVE BIOGEL PI INDICATOR 7.5 (GLOVE) ×1
GLOVE ECLIPSE 6.5 STRL STRAW (GLOVE) ×2 IMPLANT
GLOVE EXAM NITRILE MD LF STRL (GLOVE) ×2 IMPLANT
GLOVE SURG SS PI 7.0 STRL IVOR (GLOVE) ×2 IMPLANT
GOWN STRL REUS W/ TWL LRG LVL3 (GOWN DISPOSABLE) ×3 IMPLANT
GOWN STRL REUS W/TWL LRG LVL3 (GOWN DISPOSABLE) ×3
LIQUID BAND (GAUZE/BANDAGES/DRESSINGS) IMPLANT
NEEDLE PRECISIONGLIDE 27X1.5 (NEEDLE) IMPLANT
PACK BASIN DAY SURGERY FS (CUSTOM PROCEDURE TRAY) ×2 IMPLANT
PENCIL BUTTON HOLSTER BLD 10FT (ELECTRODE) ×2 IMPLANT
SHEET MEDIUM DRAPE 40X70 STRL (DRAPES) ×2 IMPLANT
SLEEVE SCD COMPRESS KNEE MED (MISCELLANEOUS) ×2 IMPLANT
SPONGE GAUZE 2X2 8PLY STRL LF (GAUZE/BANDAGES/DRESSINGS) IMPLANT
STRIP CLOSURE SKIN 1/2X4 (GAUZE/BANDAGES/DRESSINGS) IMPLANT
STRIP CLOSURE SKIN 1/4X4 (GAUZE/BANDAGES/DRESSINGS) IMPLANT
SUT PLAIN 5 0 P 3 18 (SUTURE) IMPLANT
SUT PROLENE 5 0 P 3 (SUTURE) IMPLANT
SUT PROLENE 5 0 PS 2 (SUTURE) IMPLANT
SUT PROLENE 6 0 P 1 18 (SUTURE) IMPLANT
SYR BULB 3OZ (MISCELLANEOUS) IMPLANT
SYR CONTROL 10ML LL (SYRINGE) ×2 IMPLANT
TOWEL OR 17X24 6PK STRL BLUE (TOWEL DISPOSABLE) ×2 IMPLANT
TRAY DSU PREP LF (CUSTOM PROCEDURE TRAY) ×2 IMPLANT

## 2014-08-02 NOTE — Interval H&P Note (Signed)
History and Physical Interval Note:  08/02/2014 6:58 AM  [name] Browning  has presented today for surgery, with the diagnosis of VASCULAR ANOMALY UPPER LIP   The various methods of treatment have been discussed with the patient and family. After consideration of risks, benefits and other options for treatment, the patient has consented to  Procedure(s): EXCISION UPPER LIP VASCULAR ANOMALY  (N/A) as a surgical intervention .  The patient's history has been reviewed, patient examined, no change in status, stable for surgery.  I have reviewed the patient's chart and labs.  Questions were answered to the patient's satisfaction.     [name]

## 2014-08-02 NOTE — Anesthesia Preprocedure Evaluation (Signed)
Anesthesia Evaluation  Patient identified by MRN, date of birth, ID band Patient awake    Reviewed: Allergy & Precautions, NPO status , Patient's Chart, lab work & pertinent test results  Airway Mallampati: II   Neck ROM: full    Dental   Pulmonary sleep apnea , former smoker,  breath sounds clear to auscultation        Cardiovascular hypertension, + dysrhythmias Atrial Fibrillation Rhythm:regular Rate:Normal  S/p successful EP ablation.   Neuro/Psych Anxiety Depression    GI/Hepatic   Endo/Other    Renal/GU      Musculoskeletal  (+) Arthritis -,   Abdominal   Peds  Hematology   Anesthesia Other Findings   Reproductive/Obstetrics                             Anesthesia Physical Anesthesia Plan  ASA: II  Anesthesia Plan: General   Post-op Pain Management:    Induction: Intravenous  Airway Management Planned: Oral ETT  Additional Equipment:   Intra-op Plan:   Post-operative Plan: Extubation in OR  Informed Consent: I have reviewed the patients History and Physical, chart, labs and discussed the procedure including the risks, benefits and alternatives for the proposed anesthesia with the patient or authorized representative who has indicated his/her understanding and acceptance.     Plan Discussed with: CRNA, Anesthesiologist and Surgeon  Anesthesia Plan Comments:         Anesthesia Quick Evaluation

## 2014-08-02 NOTE — Op Note (Signed)
Operative Note   DATE OF OPERATION: 6.17.2016  LOCATION: [facility]-outpatient  SURGICAL DIVISION: Plastic Surgery  PREOPERATIVE DIAGNOSES:  1. Vascular lesion upper lip  POSTOPERATIVE DIAGNOSES:  same  PROCEDURE:  1. Excision benign lesion upper lip 0.8 cm 2. Layered closure upper lip 1.2 cm  SURGEON: Irene Limbo MD MBA  ASSISTANT: none  ANESTHESIA:  General.   EBL: minimal  COMPLICATIONS: None.   INDICATIONS FOR PROCEDURE:  The patient, [name] Browning, is a 74 y.o. 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  DESCRIPTION OF PROCEDURE:  The patient's operative site was marked with the patient in the preoperative area. The patient was taken to the operating room. SCDs were placed and IV antibiotics were given. The patient's operative site was prepped and draped in a sterile fashion. A time out was performed and all information was confirmed to be correct. Local anesthetic infiltrated to perform bilateral infraorbital nerve blocks. Diamond shaped excision of lesion completed with resection full thickness vermilion and adjacent skin, and partial orbicularis oris muscle resection. Hemostasis obtained with cautery. Closure completed in layered with interrupted 4-0 vicryl in muscle layer and dermis. White line approximated with interrupted 5-0 plain gut and skin closure completed with 5-0 plain gut. Vermillion aligned with interrupted 5-0 chromic. Antibiotic ointment applied.  The patient was allowed to wake from anesthesia, extubated and taken to the recovery room in satisfactory condition.   SPECIMENS: upper lip lesion  DRAINS: none  Irene Limbo, MD [facility] Plastic & Reconstructive Surgery [phone]

## 2014-08-02 NOTE — Discharge Instructions (Signed)

## 2014-08-02 NOTE — Anesthesia Postprocedure Evaluation (Signed)
  Anesthesia Post-op Note  Patient: [name] Browning  Procedure(s) Performed: Procedure(s): EXCISION UPPER LIP VASCULAR ANOMALY  (N/A)  Patient Location: PACU  Anesthesia Type: General   Level of Consciousness: awake, alert  and oriented  Airway and Oxygen Therapy: Patient Spontanous Breathing  Post-op Pain: none  Post-op Assessment: Post-op Vital signs reviewed  Post-op Vital Signs: Reviewed  Last Vitals:  Filed Vitals:   08/02/14 1145  BP: 122/62  Pulse: 59  Temp: 36.4 C  Resp: 15    Complications: No apparent anesthesia complications

## 2014-08-02 NOTE — Transfer of Care (Signed)
Immediate Anesthesia Transfer of Care Note  Patient: [name] Browning  Procedure(s) Performed: Procedure(s): EXCISION UPPER LIP VASCULAR ANOMALY  (N/A)  Patient Location: PACU  Anesthesia Type:General  Level of Consciousness: sedated  Airway & Oxygen Therapy: Patient Spontanous Breathing and Patient connected to face mask oxygen  Post-op Assessment: Report given to RN and Post -op Vital signs reviewed and stable  Post vital signs: Reviewed and stable  Last Vitals:  Filed Vitals:   08/02/14 0934  BP: 132/73  Pulse: 58  Temp: 36.9 C  Resp: 16    Complications: No apparent anesthesia complications

## 2014-08-02 NOTE — Anesthesia Procedure Notes (Signed)
Procedure Name: LMA Insertion Date/Time: 08/02/2014 10:30 AM Performed by: Melynda Ripple D Pre-anesthesia Checklist: Patient identified, Emergency Drugs available, Suction available and Patient being monitored Patient Re-evaluated:Patient Re-evaluated prior to inductionOxygen Delivery Method: Circle System Utilized Preoxygenation: Pre-oxygenation with 100% oxygen Intubation Type: IV induction Ventilation: Mask ventilation without difficulty LMA: LMA flexible inserted LMA Size: 4.0 Number of attempts: 1 Airway Equipment and Method: Bite block Placement Confirmation: positive ETCO2 Tube secured with: Tape Dental Injury: Teeth and Oropharynx as per pre-operative assessment

## 2014-08-05 ENCOUNTER — Encounter (HOSPITAL_BASED_OUTPATIENT_CLINIC_OR_DEPARTMENT_OTHER): Payer: Self-pay | Admitting: Plastic Surgery

## 2014-08-13 DIAGNOSIS — Q279 Congenital malformation of peripheral vascular system, unspecified: Secondary | ICD-10-CM | POA: Diagnosis not present

## 2014-08-28 ENCOUNTER — Other Ambulatory Visit: Payer: Self-pay | Admitting: Internal Medicine

## 2014-09-02 ENCOUNTER — Other Ambulatory Visit: Payer: Self-pay | Admitting: Internal Medicine

## 2014-09-05 ENCOUNTER — Other Ambulatory Visit: Payer: Self-pay | Admitting: Internal Medicine

## 2014-09-15 ENCOUNTER — Other Ambulatory Visit: Payer: Self-pay | Admitting: Internal Medicine

## 2014-09-24 ENCOUNTER — Telehealth: Payer: Self-pay | Admitting: Internal Medicine

## 2014-09-24 NOTE — Telephone Encounter (Signed)
Avoid heat/ ov with all active meds in hand

## 2014-09-24 NOTE — Telephone Encounter (Signed)
Spoke with the pt's spouse and notified of recs  Appt was scheduled for 10/08/14

## 2014-09-24 NOTE — Telephone Encounter (Signed)
Spoke with [name] Browning's wife, states that [name] Browning is profusely sweating and gets weak when golfing. [name] Browning golfs midday per wife.   [name] Browning's wife also notes difficulty falling asleep at night.   Problem worse X2 mos.    Wife states [name] Browning is staying well hydrated- drinks lots of tea.    [name] Browning takes Klonopin and trazadone to help him sleep. Wife is concerned and is requesting recs.    [name] Browning uses [facility] [address].      MW please advise.  thanks

## 2014-09-25 ENCOUNTER — Other Ambulatory Visit: Payer: Self-pay | Admitting: Internal Medicine

## 2014-10-08 ENCOUNTER — Ambulatory Visit: Payer: Medicare Other | Admitting: Internal Medicine

## 2014-10-08 DIAGNOSIS — Z125 Encounter for screening for malignant neoplasm of prostate: Secondary | ICD-10-CM | POA: Diagnosis not present

## 2014-10-08 DIAGNOSIS — N401 Enlarged prostate with lower urinary tract symptoms: Secondary | ICD-10-CM | POA: Diagnosis not present

## 2014-10-08 DIAGNOSIS — E291 Testicular hypofunction: Secondary | ICD-10-CM | POA: Diagnosis not present

## 2014-10-15 DIAGNOSIS — N401 Enlarged prostate with lower urinary tract symptoms: Secondary | ICD-10-CM | POA: Diagnosis not present

## 2014-10-15 DIAGNOSIS — N138 Other obstructive and reflux uropathy: Secondary | ICD-10-CM | POA: Diagnosis not present

## 2014-10-15 DIAGNOSIS — E291 Testicular hypofunction: Secondary | ICD-10-CM | POA: Diagnosis not present

## 2014-10-16 ENCOUNTER — Ambulatory Visit: Payer: Medicare Other | Admitting: Internal Medicine

## 2014-10-30 ENCOUNTER — Telehealth: Payer: Self-pay | Admitting: Internal Medicine

## 2014-10-30 NOTE — Telephone Encounter (Signed)
New message     Wife request to talk to Dr Caryl Comes Patient has a rapid heart beat.  It was noticed by the urologist 2 weeks ago.  Pt is very very tired and he sweats a lot when he is out in the heat---this is unusual for him. An appt has been made but wife want to talk to Dr Caryl Comes.

## 2014-10-30 NOTE — Telephone Encounter (Signed)
Called patient's wife, Latham Kinzler, about the message left with operators. Patient's wife is a personal friend of Dr. Olin Pia and does not want to speak to anyone but Dr. Caryl Comes.  Patient has an appointment with Cecilie Kicks NP, on Monday. It appears this is not satisfactory to the patient's wife. Will send to Dr. Caryl Comes.

## 2014-11-04 ENCOUNTER — Emergency Department (HOSPITAL_COMMUNITY)
Admission: EM | Admit: 2014-11-04 | Discharge: 2014-11-04 | Disposition: A | Discharge status: Home / Self Care | Payer: Medicare Other | Attending: Emergency Medicine | Admitting: Emergency Medicine

## 2014-11-04 ENCOUNTER — Encounter (HOSPITAL_COMMUNITY): Payer: Self-pay | Admitting: Emergency Medicine

## 2014-11-04 ENCOUNTER — Ambulatory Visit (INDEPENDENT_AMBULATORY_CARE_PROVIDER_SITE_OTHER): Payer: Medicare Other

## 2014-11-04 ENCOUNTER — Ambulatory Visit (INDEPENDENT_AMBULATORY_CARE_PROVIDER_SITE_OTHER): Payer: Medicare Other | Admitting: Cardiology

## 2014-11-04 ENCOUNTER — Encounter: Payer: Self-pay | Admitting: Cardiology

## 2014-11-04 VITALS — BP 142/86 | HR 56 | Ht 66.5 in | Wt 150.5 lb

## 2014-11-04 DIAGNOSIS — R001 Bradycardia, unspecified: Secondary | ICD-10-CM

## 2014-11-04 DIAGNOSIS — I480 Paroxysmal atrial fibrillation: Secondary | ICD-10-CM

## 2014-11-04 DIAGNOSIS — E785 Hyperlipidemia, unspecified: Secondary | ICD-10-CM | POA: Diagnosis not present

## 2014-11-04 DIAGNOSIS — F419 Anxiety disorder, unspecified: Secondary | ICD-10-CM | POA: Diagnosis not present

## 2014-11-04 DIAGNOSIS — H269 Unspecified cataract: Secondary | ICD-10-CM | POA: Insufficient documentation

## 2014-11-04 DIAGNOSIS — R011 Cardiac murmur, unspecified: Secondary | ICD-10-CM | POA: Diagnosis not present

## 2014-11-04 DIAGNOSIS — Y998 Other external cause status: Secondary | ICD-10-CM | POA: Insufficient documentation

## 2014-11-04 DIAGNOSIS — W228XXA Striking against or struck by other objects, initial encounter: Secondary | ICD-10-CM | POA: Diagnosis not present

## 2014-11-04 DIAGNOSIS — F329 Major depressive disorder, single episode, unspecified: Secondary | ICD-10-CM | POA: Diagnosis not present

## 2014-11-04 DIAGNOSIS — Y9289 Other specified places as the place of occurrence of the external cause: Secondary | ICD-10-CM | POA: Diagnosis not present

## 2014-11-04 DIAGNOSIS — Z87891 Personal history of nicotine dependence: Secondary | ICD-10-CM | POA: Insufficient documentation

## 2014-11-04 DIAGNOSIS — Z79899 Other long term (current) drug therapy: Secondary | ICD-10-CM | POA: Diagnosis not present

## 2014-11-04 DIAGNOSIS — Y9389 Activity, other specified: Secondary | ICD-10-CM | POA: Insufficient documentation

## 2014-11-04 DIAGNOSIS — S0101XA Laceration without foreign body of scalp, initial encounter: Secondary | ICD-10-CM | POA: Insufficient documentation

## 2014-11-04 DIAGNOSIS — R5383 Other fatigue: Secondary | ICD-10-CM

## 2014-11-04 DIAGNOSIS — I4891 Unspecified atrial fibrillation: Secondary | ICD-10-CM | POA: Diagnosis not present

## 2014-11-04 NOTE — ED Notes (Addendum)
The patient said he tried to open a cabinet, opened the wrong one and hit the top of his head.  The patient does have about a 2 inch laceration bleeding controlled. The patient is not on blood thinners, denies LOC or any symptoms.  He also said he is not in any pain.

## 2014-11-04 NOTE — ED Provider Notes (Signed)
CSN: [id number]     Arrival date & time 11/04/14  2030 History  This chart was scribed for non-physician practitioner Hyman Bible, PA-C working with Charlesetta Shanks, MD by Hilda Lias, ED Scribe. This patient was seen in room TR07C/TR07C and the patient's care was started at 10:00 PM.    Chief Complaint  Patient presents with  . Head Laceration    The patient said he tried to open a cabinet, opened the wrong one and hit the top of his head.  The patient is not on blood thinners, denies LOC or any symptoms.  He also said he is not in any pain.      The history is provided by the patient. No language interpreter was used.   HPI Comments: [name] Browning is a 74 y.o. male who presents to the Emergency Department complaining of a laceration on top of his head that has been present for a few hours after pt hit his head on the trunk of his SUV.   Pt states he did not lose consciousness, and is not currently in any pain, nor does he have any other symptoms. No nausea, vomiting, or vision changes.  No neck pain.  Pt states he is not on blood thinners.  He thinks his tetanus is UTD.      Past Medical History  Diagnosis Date  . ADD (attention deficit disorder)   . Hyperlipidemia   . DJD (degenerative joint disease) of knee   . Depression   . Anxiety   . Cataracts, bilateral   . Heart murmur     a. 06/2011 Echo: 65-70%, mild MR  . Atrial flutter     a. s/p RFCA 07/2011.  Xarelto d/c 08/2011.  . S/P cardiac cath     a. in 1990's - Dr. Gwenlyn Found - WNL  . Conjunctivitis 09/14/2013  . Dysrhythmia     hx a-flutter with successful ablation 2013  . Sleep apnea     could not tolerate cpap, uses mouth guard now   Past Surgical History  Procedure Laterality Date  . Wrist fracture surgery  2007    left  . Atrial flutter ablation N/A 08/06/2011    Procedure: ATRIAL FLUTTER ABLATION;  Surgeon: Deboraha Sprang, MD;  Location: [facility] CATH LAB;  Service: Cardiovascular;  Laterality: N/A;  . Lesion removal  N/A 08/02/2014    Procedure: EXCISION UPPER LIP VASCULAR ANOMALY ;  Surgeon: Irene Limbo, MD;  Location: [place];  Service: Plastics;  Laterality: N/A;   Family History  Problem Relation Age of Onset  . Prostate cancer Father   . Heart disease Mother   . Leukemia Mother   . Colon cancer Neg Hx   . Stomach cancer Neg Hx    Social History  Substance Use Topics  . Smoking status: Former Smoker    Types: Cigarettes    Quit date: 02/16/1968  . Smokeless tobacco: Never Used  . Alcohol Use: No    Review of Systems  Skin: Positive for wound.  All other systems reviewed and are negative.     Allergies  Review of patient's allergies indicates no known allergies.  Home Medications   Prior to Admission medications   Medication Sig Start Date End Date Taking? Authorizing Provider  amphetamine-dextroamphetamine (ADDERALL XR) 30 MG 24 hr capsule Take 30 mg by mouth daily.    Historical Provider, MD  aspirin 81 MG tablet Take 81 mg by mouth daily.    Historical Provider, MD  atenolol (TENORMIN) 25 MG tablet TAKE 1 TABLET (25 MG TOTAL) BY MOUTH DAILY. 09/25/14   Tanda Rockers, MD  Calcium Carbonate-Vit D-Min (CALCIUM 1200 PO) Take 1 tablet by mouth daily.    Historical Provider, MD  Eszopiclone 3 MG TABS Take 3 mg by mouth at bedtime. 10/18/14   Historical Provider, MD  Omega-3 Fatty Acids (FISH OIL CONCENTRATE) 300 MG CAPS Take 300 mg by mouth daily.    Historical Provider, MD  potassium chloride (K-DUR,KLOR-CON) 10 MEQ tablet Take 10 mEq by mouth daily.    Historical Provider, MD  Psyllium (METAMUCIL PO) Take 2 scoop by mouth daily.    Historical Provider, MD  psyllium (METAMUCIL) 58.6 % powder Take 1 packet by mouth daily.    Historical Provider, MD  RAPAFLO 8 MG CAPS capsule Take 8 mg by mouth daily with breakfast.  08/24/13   Historical Provider, MD  simvastatin (ZOCOR) 40 MG tablet TAKE 1/2 TABLET (20 MG TOTAL) BY MOUTH AT BEDTIME. 09/16/14   Tanda Rockers, MD   traZODone (DESYREL) 50 MG tablet Take 100 mg by mouth at bedtime.     Historical Provider, MD  Vitamin D, Ergocalciferol, (DRISDOL) 50000 UNITS CAPS capsule Take 50,000 Units by mouth every 7 (seven) days.    Historical Provider, MD  vitamin E 400 UNIT capsule Take 400 Units by mouth.    Historical Provider, MD   BP 148/54 mmHg  Pulse 61  Temp(Src) 97.7 F (36.5 C) (Oral)  Resp 14  SpO2 97% Physical Exam  Constitutional: He is oriented to person, place, and time. He appears well-developed and well-nourished.  HENT:  Head: Normocephalic and atraumatic.    Eyes: EOM are normal. Pupils are equal, round, and reactive to light.  Neck: Normal range of motion.  Cardiovascular: Normal rate, regular rhythm and normal heart sounds.   Pulmonary/Chest: Effort normal and breath sounds normal.  Abdominal: He exhibits no distension.  Neurological: He is alert and oriented to person, place, and time. He has normal strength. No cranial nerve deficit. Gait normal.  Skin: Skin is warm and dry.  Psychiatric: He has a normal mood and affect.  Nursing note and vitals reviewed.   ED Course  Procedures (including critical care time)  DIAGNOSTIC STUDIES: Oxygen Saturation is 97% on room air, normal by my interpretation.    COORDINATION OF CARE: 10:03 PM Discussed treatment plan with pt at bedside and pt agreed to plan.   Labs Review Labs Reviewed - No data to display  Imaging Review No results found. I have personally reviewed and evaluated these images and lab results as part of my medical decision-making.   EKG Interpretation None      LACERATION REPAIR Performed by: Hyman Bible Authorized by: Hyman Bible Consent: Verbal consent obtained. Risks and benefits: risks, benefits and alternatives were discussed Consent given by: patient Patient identity confirmed: provided demographic data Prepped and Draped in normal sterile fashion Wound explored  Laceration Location:  scalp  Laceration Length: 3 cm  No Foreign Bodies seen or palpated  Anesthesia: local infiltration  Local anesthetic: none Irrigation method: syringe Amount of cleaning: standard  Skin closure: staples  Number of staples:  4  Technique: staples  Patient tolerance: Patient tolerated the procedure well with no immediate complications.  MDM   Final diagnoses:  None  Patient presents today with a scalp laceration.  No anticoagulants.  No LOC.  Normal neurological exam.  Laceration repaired with staples.  Patient reports he thinks his tetanus is  UTD, but will call his PCP in the morning to verify.  Patient stable for discharge.  Return precautions given.    I personally performed the services described in this documentation, which was scribed in my presence. The recorded information has been reviewed and is accurate.    Hyman Bible, PA-C 11/04/14 2321  Charlesetta Shanks, MD 11/05/14 [phone]

## 2014-11-04 NOTE — ED Notes (Signed)
Pt hit head on trunk of car, no LOC or fall.  Lac has stopped bleeding at this time.

## 2014-11-04 NOTE — Patient Instructions (Signed)
Your physician has recommended that you wear an event monitor. Event monitors are medical devices that record the heart's electrical activity. Doctors most often [place] these monitors to diagnose arrhythmias. Arrhythmias are problems with the speed or rhythm of the heartbeat. The monitor is a small, portable device. You can wear one while you do your normal daily activities. This is usually used to diagnose what is causing palpitations/syncope (passing out). This will e worn for 30 days.  Your physician recommends that you schedule a follow-up appointment in: 3-4 weeks with Dr. Caryl Comes.

## 2014-11-04 NOTE — Progress Notes (Signed)
Cardiology Office Note   Date:  11/04/2014   ID:  [name] Browning, DOB [birth year], MRN [id number]  PCP:  Christinia Gully, MD  Cardiologist:  Dr. Caryl Comes    Chief Complaint  Patient presents with  . Atrial Fibrillation    pt c/o no chest pain or any other problems. tired a lot and sweat a lot      History of Present Illness: [name] Browning is a 74 y.o. male who presents for fatigue and sweating.  He also has had a rapid HR.  Wife reported it 10/30/14, urologist also noted.   His TSH was normal in June.  He has a hx or atrial flutter ablation June 2013.  His ASA he continues.  Pt without chest pain.  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  His wife is concerned about his memory on the statin. He also has muscle cramps at night.  He is asking about his heart- how do you know if you have CAD.  We discussed pain, or DOE. He believes he had a cardiac cath years ago- I do not find note.       Past Medical History  Diagnosis Date  . ADD (attention deficit disorder)   . Hyperlipidemia   . DJD (degenerative joint disease) of knee   . Depression   . Anxiety   . Cataracts, bilateral   . Heart murmur     a. 06/2011 Echo: 65-70%, mild MR  . Atrial flutter     a. s/p RFCA 07/2011.  Xarelto d/c 08/2011.  . S/P cardiac cath     a. in 1990's - Dr. Gwenlyn Found - WNL  . Conjunctivitis 09/14/2013  . Dysrhythmia     hx a-flutter with successful ablation 2013  . Sleep apnea     could not tolerate cpap, uses mouth guard now    Past Surgical History  Procedure Laterality Date  . Wrist fracture surgery  2007    left  . Atrial flutter ablation N/A 08/06/2011    Procedure: ATRIAL FLUTTER ABLATION;  Surgeon: Deboraha Sprang, MD;  Location: [facility] CATH LAB;  Service: Cardiovascular;  Laterality: N/A;  . Lesion removal N/A 08/02/2014    Procedure: EXCISION UPPER LIP VASCULAR ANOMALY ;  Surgeon: Irene Limbo, MD;   Location: [place];  Service: Plastics;  Laterality: N/A;     Current Outpatient Prescriptions  Medication Sig Dispense Refill  . amphetamine-dextroamphetamine (ADDERALL XR) 30 MG 24 hr capsule Take 30 mg by mouth daily.    [name] aspirin 81 MG tablet Take 81 mg by mouth daily.    [name] atenolol (TENORMIN) 25 MG tablet TAKE 1 TABLET (25 MG TOTAL) BY MOUTH DAILY. 30 tablet 11  . Calcium Carbonate-Vit D-Min (CALCIUM 1200 PO) Take 1 tablet by mouth daily.    . Eszopiclone 3 MG TABS Take 3 mg by mouth at bedtime.  0  . Omega-3 Fatty Acids (FISH OIL CONCENTRATE) 300 MG CAPS Take 300 mg by mouth daily.    . potassium chloride (K-DUR,KLOR-CON) 10 MEQ tablet Take 10 mEq by mouth daily.    . Psyllium (METAMUCIL PO) Take 2 scoop by mouth daily.    . psyllium (METAMUCIL) 58.6 % powder Take 1 packet by mouth daily.    [name] RAPAFLO 8 MG CAPS capsule Take 8 mg by mouth daily with breakfast.     . simvastatin (  ZOCOR) 40 MG tablet TAKE 1/2 TABLET (20 MG TOTAL) BY MOUTH AT BEDTIME. 30 tablet 2  . traZODone (DESYREL) 50 MG tablet Take 100 mg by mouth at bedtime.     . Vitamin D, Ergocalciferol, (DRISDOL) 50000 UNITS CAPS capsule Take 50,000 Units by mouth every 7 (seven) days.    . vitamin E 400 UNIT capsule Take 400 Units by mouth.     No current facility-administered medications for this visit.    Allergies:   Review of patient's allergies indicates no known allergies.    Social History:  The patient  reports that he quit smoking about 46 years ago. His smoking use included Cigarettes. He has never used smokeless tobacco. He reports that he does not drink alcohol.   Family History:  The patient's family history includes Heart disease in his mother; Leukemia in his mother; Prostate cancer in his father. There is no history of Colon cancer or Stomach cancer.    ROS:  General:no colds or fevers, mild increase of weight  Skin:no rashes or ulcers HEENT:no blurred vision, no congestion CV:see HPI-  pt not aware of palpitations or irregular HR  PUL:see HPI GI:no diarrhea constipation or melena, no indigestion GU:no hematuria, no dysuria MS:no joint pain, no claudication Neuro:no syncope, no lightheadedness Endo:no diabetes, no thyroid disease  Wt Readings from Last 3 Encounters:  11/04/14 150 lb 8 oz (68.266 kg)  08/02/14 147 lb 8 oz (66.906 kg)  07/29/14 145 lb (65.772 kg)     PHYSICAL EXAM: VS:  BP 142/86 mmHg  Pulse 56  Ht 5' 6.5" (1.689 m)  Wt 150 lb 8 oz (68.266 kg)  BMI 23.93 kg/m2 , BMI Body mass index is 23.93 kg/(m^2). General:Pleasant affect, NAD Skin:Warm and dry, brisk capillary refill HEENT:normocephalic, sclera clear, mucus membranes moist Neck:supple, no JVD, no bruits  Heart:S1S2 RRR without murmur, gallup, rub or click Lungs:clear without rales, rhonchi, or wheezes XBW:IOMB, non tender, + BS, do not palpate liver spleen or masses Ext:no lower ext edema, 2+ pedal pulses, 2+ radial pulses Neuro:alert and oriented X 3, MAE, follows commands, + facial symmetry    EKG:  EKG is ordered today. The ekg ordered today demonstrates SB at 91, no acute changes.   Recent Labs: 07/22/2014: ALT 17; BUN 19; Creatinine, Ser 1.21; Platelets 290.0; Potassium 4.1; Sodium 139; TSH 1.85 08/02/2014: Hemoglobin 13.6    Lipid Panel    Component Value Date/Time   CHOL 181 07/22/2014 1040   TRIG 123.0 07/22/2014 1040   HDL 49.60 07/22/2014 1040   CHOLHDL 4 07/22/2014 1040   VLDL 24.6 07/22/2014 1040   LDLCALC 107* 07/22/2014 1040       Other studies Reviewed: Additional studies/ records that were reviewed today include: previous notes.   ASSESSMENT AND PLAN:  1.  Tachycardia with hx of a fib having had ablation.  His symptoms are diaphoresis and fatigue, he is brady today.  Will have him wear 30 day event monitor to rule out tachy brady.  If this is negative then a stress test.  Will have pt follow up with Dr. Jolyn Nap   2.   No hx of CAD but + FH and with age  may be a factor.     Current medicines are reviewed with the patient today.  The patient Has no concerns regarding medicines.  The following changes have been made:  See above Labs/ tests ordered today include:see above  Disposition:   FU:  see above  Signed, INGOLD,LAURA R,  NP  11/04/2014 3:25 PM    [address], [place], [place] Phone: [phone]; Fax: [phone]

## 2014-11-05 ENCOUNTER — Telehealth: Payer: Self-pay | Admitting: Internal Medicine

## 2014-11-05 NOTE — Telephone Encounter (Signed)
Called spoke with pt spouse. She reports last night pt hit his head on the trunk of car and had to get staples placed. He was told to call us to see if he needed another tetnus vaccine. Last one was 06/08/11. Please advise MW thanks

## 2014-11-05 NOTE — Telephone Encounter (Signed)
Left message for pt to call back  °

## 2014-11-05 NOTE — Telephone Encounter (Signed)
Not needed

## 2014-11-06 NOTE — Telephone Encounter (Signed)
LMTCB

## 2014-11-06 NOTE — Telephone Encounter (Signed)
Pt wife returning call.[name] Browning' °

## 2014-11-06 NOTE — Telephone Encounter (Signed)
i called pts wife on Sunday but got no answer and suggested VM that she call us monday

## 2014-11-06 NOTE — Telephone Encounter (Signed)
Called made pt spouse aware. nothing further needed

## 2014-11-18 ENCOUNTER — Telehealth: Payer: Self-pay | Admitting: Internal Medicine

## 2014-11-18 ENCOUNTER — Ambulatory Visit (INDEPENDENT_AMBULATORY_CARE_PROVIDER_SITE_OTHER): Payer: Medicare Other | Admitting: Internal Medicine

## 2014-11-18 ENCOUNTER — Encounter: Payer: Self-pay | Admitting: Internal Medicine

## 2014-11-18 VITALS — BP 122/62 | HR 66 | Ht 66.5 in | Wt 151.0 lb

## 2014-11-18 DIAGNOSIS — S0101XD Laceration without foreign body of scalp, subsequent encounter: Secondary | ICD-10-CM | POA: Diagnosis not present

## 2014-11-18 DIAGNOSIS — Z23 Encounter for immunization: Secondary | ICD-10-CM

## 2014-11-18 NOTE — Telephone Encounter (Signed)
Called and spoke to pt's wife. Pt is needing an appt with Dr. Melvyn Novas to have staples removed from scalp from a laceration. Appt made with MW today 11/18/2014. Pt's verbalized understanding and denied any further questions or concerns at this time.

## 2014-11-18 NOTE — Patient Instructions (Signed)
Keep pressure on the bandaid as much as you can over the next hour  Pull bandaid off in 24 hours

## 2014-11-18 NOTE — Progress Notes (Signed)
Subjective:     Patient ID: [name] Browning, male   DOB: 1940-05-26   MRN: [id number]   Brief patient profile:  21  yowm quit smoking 1970  with history of hyperlipidemia, allergic rhinitis , and depression.    History of Present Illness  03/2007--presented for CPX-hx of an abnormal EKG leading to a left heart catheter in 1994 that was felt to be completely normal and therefore felt to represent a false positive EKG       06/18/2011 f/u ov/[name] Browning cc aflutter noted at @ colonoscopy  06/14/11 and pt completely asym x "a little more fatigued" s palp, cp, sob, presyncope.  Denies excess caffeine or decongestant use Imp RAF rec Increase aspirin to 325 mg one daily but take the coated type> cards eval    08/06/11 - s/p successful RFA   - CHADS-VASc score of 1    D/c zocor around  Early Dec 2015 due to concerns with cognition> some better    04/02/2014 f/u ov/[name] Browning re: hyperlipidemia/ > ? Adverse effect of zocor  Chief Complaint  Patient presents with  . Follow-up    Pt states doing well. His cough has resolved. He has not had any more confusion. No new co's today.   still using peppermint /chocolate but no cough or sob  rec All follow up on Vit D per Dr Evelene Croon re levels and recommendations for treatment unless/until she turns it over to me  Late add : restart zocor at 20 mg per day and observe for any ams       07/22/2014 f/u ov/[name] Browning re: hyperlipidemia/ h/o afib/ on androgel s urology f/u  Chief Complaint  Patient presents with  . Follow-up    Pt has no complaints today- feels well.    No change in memory / back on zocor 20 mg daily  No tia/ claudication or  cp or chest tightness, subjective wheeze overt sinus or hb symptoms. No unusual exp hx or h/o childhood pna/ asthma or knowledge of premature birth. rec Be sure to call [facility] re follow up  Prevnar-13 today   11/04/14  To ER with head laceration from closing trunk    11/18/2014  f/u ov/[name] Browning re: scalp laceration  Chief  Complaint  Patient presents with  . Suture / Staple Removal    Pt went to ED on 11/04/14 with scalp laceration after hitting his head on the trunk of his car. He states he is doing well and denies any co's today.       No ha n or v and no loc at time of injury / tdap updated 2013   Sleeping ok without nocturnal  or early am exacerbation  of respiratory  c/o's or need for noct saba. Also denies any obvious fluctuation of symptoms with weather or environmental changes or other aggravating or alleviating factors except as outlined above   Current Medications, Allergies, Complete Past Medical History, Past Surgical History, Family History, and Social History were reviewed in [employer] record.  ROS  The following are not active complaints unless bolded sore throat, dysphagia, dental problems, itching, sneezing,  nasal congestion or excess/ purulent secretions, ear ache,   fever, chills, sweats, unintended wt loss, pleuritic or exertional cp, hemoptysis,  orthopnea pnd or leg swelling, presyncope, palpitations, heartburn, abdominal pain, anorexia, nausea, vomiting, diarrhea  or change in bowel or urinary habits, change in stools or urine, dysuria,hematuria,  rash, arthralgias, visual complaints, headache, numbness weakness or ataxia or problems with  walking or coordination,  change in mood/affect or memory.          Past Medical History:  Adult ADD........................................................................................Marland KitchenKaur  Hyperlipidemia  - Target LDL < 130 male, type A, pos fm hx  hemorrhoidal bleeding.....................................................................Marland KitchenPatterson  - colonoscopy 05/22/2001  And 06/14/11 with AVM's sleep apnea seat CPAP titration to 10 recommended 10/08/2002 for a baseline RDI of 33  - stopped 2007  Testerone def...................................................................................[name]  [facility]  Maintenance............................................................................Marland KitchenWert  - DT 05/2011  - Pneumovax April 15, 2008,  Prevnar 13 07/22/2014   - CPX 02/27/13  DJD Left Knee....................................................................................Marland KitchenGioffree  New A flutter 06/18/2011 ........................................................................[name] [place] [facility]          Objective:   Physical Exam  GEN: A/Ox3; pleasant , NAD, slightly anxious   wt 153 February 07, 2008 > > 147 September 15, 2009 > 151 April 30, 2010 > 152 06/08/2011 > 06/18/2011  149> 09/26/2012  146  > 02/27/2013 148 > 06/15/2013  148> 02/19/2014  150 > 04/02/2014  144> 07/22/14  146  > 11/18/2014  151  HEENT: [place]/AT, , EACs-clear, TMs-wnl, NOSE-clear, THROAT-clear  NECK: Supple w/ fair ROM; no JVD; normal carotid impulses w/o bruits; no thyromegaly or nodules palpated; no lymphadenopathy.  CHEST   Completely clear to A and P   RRR, no m/r/g  Pulse 70 and perfectly regular ABDOMEN: Soft & nt; nml bowel sounds; no organomegaly or masses detected.  EXT: Warm bil, no calf pain, edema, clubbing, pulses ok x L DP reduced MS nl gait, no deformity Neuro nl sensorium, no tremors, no motor or cerebellar deficits/ full recall of recent events Skin - well healed 4 cm lac over crown of head       CXR PA and Lateral:   07/22/2014 :     I personally reviewed images and agree with radiology impression as follows:    Mediastinum and hilar structures are normal. Prominent cardiac fat pad. Lungs are clear. No pleural effusion or pneumothorax. Heart size normal. No acute bony abnormality.    Assessment:

## 2014-11-20 ENCOUNTER — Encounter: Payer: Self-pay | Admitting: Internal Medicine

## 2014-11-20 DIAGNOSIS — S0101XA Laceration without foreign body of scalp, initial encounter: Secondary | ICD-10-CM

## 2014-11-20 HISTORY — DX: Laceration without foreign body of scalp, initial encounter: S01.01XA

## 2014-11-20 NOTE — Assessment & Plan Note (Signed)
The wound looks to be well healed. The sutures removed without difficulty except for one on the edge of the wound which bled a bit and so a pressure bandage was applied as he is on aspirin and he was advised to hold pressure on for at least an hour before removing it in 24 hours and call if needed.

## 2014-11-28 ENCOUNTER — Ambulatory Visit (INDEPENDENT_AMBULATORY_CARE_PROVIDER_SITE_OTHER): Payer: Medicare Other | Admitting: Internal Medicine

## 2014-11-28 ENCOUNTER — Encounter: Payer: Self-pay | Admitting: Internal Medicine

## 2014-11-28 VITALS — BP 130/86 | HR 53 | Ht 66.5 in | Wt 154.0 lb

## 2014-11-28 DIAGNOSIS — R001 Bradycardia, unspecified: Secondary | ICD-10-CM

## 2014-11-28 DIAGNOSIS — I480 Paroxysmal atrial fibrillation: Secondary | ICD-10-CM

## 2014-11-28 NOTE — Progress Notes (Signed)
Patient Care Team: Tanda Rockers, MD as PCP - General (Pulmonary Disease) Chucky May, MD as Consulting Physician (Psychiatry)   HPI  [name] Browning is a 74 y.o. male  He has a history of atrial flutter which he underwent catheter ablation June 2013  He was seen by [facility] because of complaints of palpitations fatigue and sweating. His rapid heart rate was apparently also noted by a urologist.   Records and Results Reviewed  outpt records   Past Medical History  Diagnosis Date  . ADD (attention deficit disorder)   . Hyperlipidemia   . DJD (degenerative joint disease) of knee   . Depression   . Anxiety   . Cataracts, bilateral   . Heart murmur     a. 06/2011 Echo: 65-70%, mild MR  . Atrial flutter ([place])     a. s/p RFCA 07/2011.  Xarelto d/c 08/2011.  . S/P cardiac cath     a. in 1990's - Dr. Gwenlyn Found - WNL  . Conjunctivitis 09/14/2013  . Dysrhythmia     hx a-flutter with successful ablation 2013  . Sleep apnea     could not tolerate cpap, uses mouth guard now    Past Surgical History  Procedure Laterality Date  . Wrist fracture surgery  2007    left  . Atrial flutter ablation N/A 08/06/2011    Procedure: ATRIAL FLUTTER ABLATION;  Surgeon: Deboraha Sprang, MD;  Location[facility] CATH LAB;  Service: Cardiovascular;  Laterality: N/A;  . Lesion removal N/A 08/02/2014    Procedure: EXCISION UPPER LIP VASCULAR ANOMALY ;  Surgeon: Irene Limbo, MD;  Location: [place];  Service: Plastics;  Laterality: N/A;    Current Outpatient Prescriptions  Medication Sig Dispense Refill  . aspirin 81 MG tablet Take 81 mg by mouth daily.    [name] atenolol (TENORMIN) 25 MG tablet TAKE 1 TABLET (25 MG TOTAL) BY MOUTH DAILY. 30 tablet 11  . Calcium Carbonate-Vit D-Min (CALCIUM 1200 PO) Take 1 tablet by mouth daily.    . Eszopiclone 3 MG TABS Take 3 mg by mouth at bedtime.  0  . LORazepam (ATIVAN) 1 MG tablet Take 1 mg by mouth daily.    . Omega-3 Fatty Acids (FISH OIL  CONCENTRATE) 300 MG CAPS Take 300 mg by mouth daily.    . potassium chloride (K-DUR,KLOR-CON) 10 MEQ tablet Take 10 mEq by mouth daily.    . psyllium (METAMUCIL) 58.6 % powder Take 1 packet by mouth daily.    [name] RAPAFLO 8 MG CAPS capsule Take 8 mg by mouth daily with breakfast.     . traZODone (DESYREL) 50 MG tablet Take 100 mg by mouth at bedtime.     . Vitamin D, Ergocalciferol, (DRISDOL) 50000 UNITS CAPS capsule Take 50,000 Units by mouth every 7 (seven) days.    . vitamin E 400 UNIT capsule Take 400 Units by mouth.     No current facility-administered medications for this visit.    No Known Allergies    Review of Systems negative except from HPI and PMH  Physical Exam BP 130/86 mmHg  Pulse 53  Ht 5' 6.5" (1.689 m)  Wt 154 lb (69.854 kg)  BMI 24.49 kg/m2 Well developed and well nourished in no acute distress HENT normal E scleral and icterus clear Neck Supple JVP flat; carotids brisk and full Clear to ausculation  Regular rate and rhythm, no murmurs gallops or rub Soft with active bowel sounds No clubbing cyanosis  Edema Alert and oriented, grossly normal motor and sensory function Skin Warm and Dry  ECG sinus 53 22/09/42 Nonspecific T Assessment and  Plan Tachycardia   Hx of Atrial flutter  Hypertension  Sinus bradycardia  The patient has a history of atrial flutter. He was seen by Dr. Karsten Ro who felt an irregular pulse that was thought to be atrial fibrillation. It wasn't documented. We have reviewed at some length the physiology of atrial fibrillation and this distinction from sinus rhythm and we have proposed that he take his pulse on a daily basis so that in the event that he was able to detect a variation from normal we could then utilize a monitor i.e. AliveCor, to record it . The importance of detection of atrial fibrillation in this patient would be anticoagulation as he has a CHADS-VASc score of 81M approaching 74 years of age  Given his bradycardia, we  will have him stop his atenolol. There are no data that aspirin is better than nothing for his atrial fibrillation and so we will stop it also.

## 2014-12-16 ENCOUNTER — Telehealth: Payer: Self-pay | Admitting: Internal Medicine

## 2014-12-16 NOTE — Telephone Encounter (Signed)
New Message    Pt's wife returning Pam P's call.

## 2014-12-16 NOTE — Telephone Encounter (Signed)
Called patient's wife (DPR) about [name] Kicks NP results note. His slow HR should improveafter stopping his atenolol- Dr. Caryl Comes stopped on the 13th. Patient's wife verbalized understanding.

## 2015-01-31 DIAGNOSIS — H524 Presbyopia: Secondary | ICD-10-CM | POA: Diagnosis not present

## 2015-01-31 DIAGNOSIS — Z961 Presence of intraocular lens: Secondary | ICD-10-CM | POA: Diagnosis not present

## 2015-01-31 DIAGNOSIS — H43813 Vitreous degeneration, bilateral: Secondary | ICD-10-CM | POA: Diagnosis not present

## 2015-02-03 DIAGNOSIS — Z23 Encounter for immunization: Secondary | ICD-10-CM | POA: Diagnosis not present

## 2015-02-03 DIAGNOSIS — H35432 Paving stone degeneration of retina, left eye: Secondary | ICD-10-CM | POA: Diagnosis not present

## 2015-02-03 DIAGNOSIS — Z961 Presence of intraocular lens: Secondary | ICD-10-CM | POA: Diagnosis not present

## 2015-02-03 DIAGNOSIS — H35431 Paving stone degeneration of retina, right eye: Secondary | ICD-10-CM | POA: Diagnosis not present

## 2015-06-17 ENCOUNTER — Encounter: Payer: Self-pay | Admitting: *Deleted

## 2015-06-19 NOTE — Progress Notes (Signed)
Electrophysiology Office Note Date: 06/20/2015  ID:  [name] Browning, DOB 12/11/1940, MRN [id number]  PCP: Christinia Gully, MD Electrophysiologist: Caryl Comes  CC: follow up for irregular heart beat  [name] Browning is a 75 y.o. male seen today for Dr Caryl Comes.  He is s/p flutter ablation in 2013.  He had an irregular heart rate noted by his urologist and saw Dr Caryl Comes in follow up in October of last year. At that time, they discussed doing daily pulse checks and potentially using AliveCor to evaluate for presence of AF.  He presents today for routine electrophysiology followup.  Since last being seen in our clinic, the patient reports doing reasonably well.  He reports increased daytime fatigue with activity and decreased energy levels. He is compliant with CPAP dental apparatus and home sleep studies have shown good OSA control.  He denies chest pain, palpitations, dyspnea, PND, orthopnea, nausea, vomiting, dizziness, syncope, edema, weight gain, or early satiety.  Past Medical History  Diagnosis Date  . ADD (attention deficit disorder)   . Hyperlipidemia   . DJD (degenerative joint disease) of knee   . Depression   . Anxiety   . Cataracts, bilateral   . Heart murmur     a. 06/2011 Echo: 65-70%, mild MR  . Atrial flutter ([place])     a. s/p RFCA 07/2011.  Xarelto d/c 08/2011.  . S/P cardiac cath     a. in 1990's - Dr. Gwenlyn Found - WNL  . Conjunctivitis 09/14/2013  . Dysrhythmia     hx a-flutter with successful ablation 2013  . Sleep apnea     could not tolerate cpap, uses mouth guard now   Past Surgical History  Procedure Laterality Date  . Wrist fracture surgery  2007    left  . Atrial flutter ablation N/A 08/06/2011    Procedure: ATRIAL FLUTTER ABLATION;  Surgeon: Deboraha Sprang, MD;  Location: [facility] CATH LAB;  Service: Cardiovascular;  Laterality: N/A;  . Lesion removal N/A 08/02/2014    Procedure: EXCISION UPPER LIP VASCULAR ANOMALY ;  Surgeon: Irene Limbo, MD;  Location: [place];  Service: Plastics;  Laterality: N/A;    Current Outpatient Prescriptions  Medication Sig Dispense Refill  . Eszopiclone 3 MG TABS Take 3 mg by mouth at bedtime.  0  . LORazepam (ATIVAN) 1 MG tablet Take 1 mg by mouth daily.    . Multiple Vitamin tablet Take 1 tablet by mouth daily.    . potassium chloride (K-DUR,KLOR-CON) 10 MEQ tablet Take 10 mEq by mouth daily.    . psyllium (METAMUCIL) 58.6 % powder Take 1 packet by mouth daily.    [name] RAPAFLO 8 MG CAPS capsule Take 8 mg by mouth daily with breakfast.     . traZODone (DESYREL) 50 MG tablet Take 100 mg by mouth at bedtime.      No current facility-administered medications for this visit.    Allergies:   Review of patient's allergies indicates no known allergies.   Social History: Social History   Social History  . Marital Status: Married    Spouse Name: N/A  . Number of Children: N/A  . Years of Education: N/A   Occupational History  . RetiredWater quality scientist for [employer]    Social History Main Topics  . Smoking status: Former Smoker    Types: Cigarettes    Quit date: 02/16/1968  . Smokeless tobacco: Never Used  . Alcohol Use: No  . Drug Use: Not  on file  . Sexual Activity: Not on file   Other Topics Concern  . Not on file   Social History Narrative    Family History: Family History  Problem Relation Age of Onset  . Prostate cancer Father   . Heart disease Mother   . Leukemia Mother   . Colon cancer Neg Hx   . Stomach cancer Neg Hx     Review of Systems: All other systems reviewed and are otherwise negative except as noted above.   Physical Exam: VS:  BP 120/78 mmHg  Pulse 68  Ht 5' 6.5" (1.689 m)  Wt 152 lb 3.2 oz (69.037 kg)  BMI 24.20 kg/m2 , BMI Body mass index is 24.2 kg/(m^2). Wt Readings from Last 3 Encounters:  06/20/15 152 lb 3.2 oz (69.037 kg)  11/28/14 154 lb (69.854 kg)  11/18/14 151 lb (68.493 kg)    GEN- The patient is well appearing, alert and oriented x 3 today.     HEENT: normocephalic, atraumatic; sclera clear, conjunctiva pink; hearing intact; oropharynx clear; neck supple  Lungs- Clear to ausculation bilaterally, normal work of breathing.  No wheezes, rales, rhonchi Heart- Regular rate and rhythm  GI- soft, non-tender, non-distended, bowel sounds present  Extremities- no clubbing, cyanosis, or edema; DP/PT/radial pulses 2+ bilaterally MS- no significant deformity or atrophy Skin- warm and dry, no rash or lesion  Psych- euthymic mood, full affect Neuro- strength and sensation are intact   EKG:  EKG is ordered today. The ekg ordered today shows sinus rhythm  Recent Labs: 07/22/2014: ALT 17; BUN 19; Creatinine, Ser 1.21; Platelets 290.0; Potassium 4.1; Sodium 139; TSH 1.85 08/02/2014: Hemoglobin 13.6    Other studies Reviewed: Additional studies/ records that were reviewed today include: Dr Olin Pia office notes  Assessment and Plan: 1.  Atrial flutter s/p ablation No documented recurrence  2.  Tachycardia No atrial fibrillation documented to date We discussed monitoring today with daily pulse checks, AliveCor, event monitoring, or implantable loop recorder.  CHADS2VASC is 1 currently, will be 2 for age in [place] of next year. With [facility] of 1 currently, and no clear indication for anticoagulation if atrial fibrillation were identified, we have elected to continue current plan for now. He does have fatigue which can be a symptom of atrial fibrillation. See below  3. Fatigue Worse recently  He has a family history of heart disease and it has been several years since ischemic evaluation I think a plain GXT is reasonable at this point to evaluate chronotropic competence and for ischemic changes If normal, could consider ILR to look for AF as a cause for fatigue Will also update echo as has been several years since evaluation   Current medicines are reviewed at length with the patient today.   The patient does not have concerns regarding  his medicines.  The following changes were made today:  none  Labs/ tests ordered today include: GXT, echo   Disposition:   Follow up with Dr Caryl Comes in 6 months (sooner depending on results of testing)   Signed, Chanetta Marshall, NP 06/20/2015 9:01 AM   [facility] [address] [place] [address] [phone] (office) [phone] (fax)

## 2015-06-20 ENCOUNTER — Encounter: Payer: Self-pay | Admitting: Nurse Practitioner

## 2015-06-20 ENCOUNTER — Ambulatory Visit (INDEPENDENT_AMBULATORY_CARE_PROVIDER_SITE_OTHER): Payer: Medicare Other | Admitting: Nurse Practitioner

## 2015-06-20 VITALS — BP 120/78 | HR 68 | Ht 66.5 in | Wt 152.2 lb

## 2015-06-20 DIAGNOSIS — I483 Typical atrial flutter: Secondary | ICD-10-CM

## 2015-06-20 DIAGNOSIS — R000 Tachycardia, unspecified: Secondary | ICD-10-CM

## 2015-06-20 DIAGNOSIS — R0602 Shortness of breath: Secondary | ICD-10-CM

## 2015-06-20 DIAGNOSIS — R5383 Other fatigue: Secondary | ICD-10-CM | POA: Diagnosis not present

## 2015-06-20 NOTE — Patient Instructions (Addendum)
Medication Instructions:   Your physician recommends that you continue on your current medications as directed. Please refer to the Current Medication list given to you today.   If you need a refill on your cardiac medications before your next appointment, please call your pharmacy.  Labwork:  NONE ORDER TODAY    Testing/Procedures:  Your physician has requested that you have an exercise tolerance test. For further information please visit [email or web address]. Please also follow instruction sheet, as given.  Your physician has requested that you have an echocardiogram. Echocardiography is a painless test that uses sound waves to create images of your heart. It provides your doctor with information about the size and shape of your heart and how well your heart's chambers and valves are working. This procedure takes approximately one hour. There are no restrictions for this procedure.     Follow-Up: Your physician wants you to follow-up in:  IN [place] will receive a reminder letter in the mail two months in advance. If you don't receive a letter, please call our office to schedule the follow-up appointment.       Any Other Special Instructions Will Be Listed Below (If Applicable).

## 2015-07-03 ENCOUNTER — Encounter: Payer: Self-pay | Admitting: *Deleted

## 2015-07-08 ENCOUNTER — Other Ambulatory Visit: Payer: Self-pay

## 2015-07-08 ENCOUNTER — Ambulatory Visit (HOSPITAL_COMMUNITY): Payer: Medicare Other | Attending: Cardiology

## 2015-07-08 ENCOUNTER — Ambulatory Visit (INDEPENDENT_AMBULATORY_CARE_PROVIDER_SITE_OTHER): Payer: Medicare Other

## 2015-07-08 DIAGNOSIS — Z87891 Personal history of nicotine dependence: Secondary | ICD-10-CM | POA: Diagnosis not present

## 2015-07-08 DIAGNOSIS — Z8249 Family history of ischemic heart disease and other diseases of the circulatory system: Secondary | ICD-10-CM | POA: Insufficient documentation

## 2015-07-08 DIAGNOSIS — R5383 Other fatigue: Secondary | ICD-10-CM

## 2015-07-08 DIAGNOSIS — I340 Nonrheumatic mitral (valve) insufficiency: Secondary | ICD-10-CM | POA: Diagnosis not present

## 2015-07-08 DIAGNOSIS — R9431 Abnormal electrocardiogram [ECG] [EKG]: Secondary | ICD-10-CM | POA: Diagnosis not present

## 2015-07-08 DIAGNOSIS — E785 Hyperlipidemia, unspecified: Secondary | ICD-10-CM | POA: Diagnosis not present

## 2015-07-08 DIAGNOSIS — G4733 Obstructive sleep apnea (adult) (pediatric): Secondary | ICD-10-CM | POA: Diagnosis not present

## 2015-07-08 DIAGNOSIS — R0602 Shortness of breath: Secondary | ICD-10-CM | POA: Diagnosis not present

## 2015-07-08 LAB — EXERCISE TOLERANCE TEST
CHL CUP STRESS STAGE 1 DBP: 75 mmHg
CHL CUP STRESS STAGE 1 SBP: 137 mmHg
CHL CUP STRESS STAGE 1 SPEED: 0 mph
CHL CUP STRESS STAGE 2 GRADE: 0 %
CHL CUP STRESS STAGE 2 HR: 66 {beats}/min
CHL CUP STRESS STAGE 3 GRADE: 0 %
CHL CUP STRESS STAGE 3 HR: 66 {beats}/min
CHL CUP STRESS STAGE 4 SBP: 188 mmHg
CHL CUP STRESS STAGE 4 SPEED: 1.7 mph
CHL CUP STRESS STAGE 5 HR: 127 {beats}/min
CHL CUP STRESS STAGE 5 SPEED: 2.5 mph
CHL CUP STRESS STAGE 6 HR: 101 {beats}/min
CHL CUP STRESS STAGE 6 SBP: 219 mmHg
CHL CUP STRESS STAGE 6 SPEED: 0 mph
CHL CUP STRESS STAGE 7 HR: 68 {beats}/min
CHL CUP STRESS STAGE 7 SBP: 163 mmHg
CHL CUP STRESS STAGE 7 SPEED: 0 mph
CSEPEW: 7 METS
CSEPPHR: 127 {beats}/min
CSEPPMHR: 86 %
Exercise duration (min): 5 min
Exercise duration (sec): 0 s
MPHR: 146 {beats}/min
Percent HR: 89 %
RPE: 16
Rest HR: 62 {beats}/min
Stage 1 Grade: 0 %
Stage 1 HR: 62 {beats}/min
Stage 2 Speed: 1 mph
Stage 3 Speed: 1 mph
Stage 4 DBP: 61 mmHg
Stage 4 Grade: 10 %
Stage 4 HR: 114 {beats}/min
Stage 5 Grade: 12 %
Stage 6 DBP: 98 mmHg
Stage 6 Grade: 0 %
Stage 7 DBP: 64 mmHg
Stage 7 Grade: 0 %

## 2015-07-10 ENCOUNTER — Encounter: Payer: Self-pay | Admitting: *Deleted

## 2015-07-10 ENCOUNTER — Telehealth: Payer: Self-pay | Admitting: *Deleted

## 2015-07-10 DIAGNOSIS — I4891 Unspecified atrial fibrillation: Secondary | ICD-10-CM

## 2015-07-10 DIAGNOSIS — R9439 Abnormal result of other cardiovascular function study: Secondary | ICD-10-CM

## 2015-07-10 NOTE — Telephone Encounter (Signed)
spoke to wife about results and  reccommendations for a myoview stress test. pt wife verbalized understanding.. Pt wife was told some will be contacting them back with an appt.[name]

## 2015-07-10 NOTE — Telephone Encounter (Signed)
-----   Message from Patsey Berthold, NP sent at 07/10/2015  8:11 AM EDT ----- GXT mildly abnormal. Would recommend myoview at this time. Please call patient to schedule. Pt also had some AF on GXT.  This confirms the diagnosis - would not change any therapy for now.  Echocardiogram was normal.

## 2015-07-11 ENCOUNTER — Telehealth: Payer: Self-pay | Admitting: *Deleted

## 2015-07-11 NOTE — Telephone Encounter (Signed)
Spoke to pt wife for [employer] instructions.[name]

## 2015-07-15 ENCOUNTER — Telehealth (HOSPITAL_COMMUNITY): Payer: Self-pay | Admitting: *Deleted

## 2015-07-15 DIAGNOSIS — D485 Neoplasm of uncertain behavior of skin: Secondary | ICD-10-CM | POA: Diagnosis not present

## 2015-07-15 DIAGNOSIS — L821 Other seborrheic keratosis: Secondary | ICD-10-CM | POA: Diagnosis not present

## 2015-07-15 DIAGNOSIS — C44719 Basal cell carcinoma of skin of left lower limb, including hip: Secondary | ICD-10-CM | POA: Diagnosis not present

## 2015-07-15 DIAGNOSIS — L570 Actinic keratosis: Secondary | ICD-10-CM | POA: Diagnosis not present

## 2015-07-15 NOTE — Telephone Encounter (Signed)
Patient given detailed instructions per Myocardial Perfusion Study Information Sheet for the test on 07/17/15 at 0730. Patient notified to arrive 15 minutes early and that it is imperative to arrive on time for appointment to keep from having the test rescheduled.  If you need to cancel or reschedule your appointment, please call the office within 24 hours of your appointment. Failure to do so may result in a cancellation of your appointment, and a $50 no show fee. Patient verbalized understanding.Deysy Schabel, [place]e [place]

## 2015-07-17 ENCOUNTER — Ambulatory Visit (HOSPITAL_COMMUNITY): Payer: Medicare Other | Attending: Cardiology

## 2015-07-17 DIAGNOSIS — R0609 Other forms of dyspnea: Secondary | ICD-10-CM | POA: Insufficient documentation

## 2015-07-17 DIAGNOSIS — I4891 Unspecified atrial fibrillation: Secondary | ICD-10-CM | POA: Insufficient documentation

## 2015-07-17 DIAGNOSIS — R9439 Abnormal result of other cardiovascular function study: Secondary | ICD-10-CM | POA: Diagnosis not present

## 2015-07-17 DIAGNOSIS — I10 Essential (primary) hypertension: Secondary | ICD-10-CM | POA: Insufficient documentation

## 2015-07-17 LAB — MYOCARDIAL PERFUSION IMAGING
CHL CUP NUCLEAR SDS: 1
CHL CUP NUCLEAR SRS: 3
CSEPED: 6 min
CSEPEDS: 0 s
CSEPEW: 7 METS
LVDIAVOL: 87 mL (ref 62–150)
LVSYSVOL: 41 mL
MPHR: 146 {beats}/min
Peak HR: 129 {beats}/min
Percent HR: 88 %
RATE: 0.29
RPE: 18
Rest HR: 54 {beats}/min
SSS: 3
TID: 1.09

## 2015-07-17 MED ORDER — TECHNETIUM TC 99M TETROFOSMIN IV KIT
32.8000 | PACK | Freq: Once | INTRAVENOUS | Status: AC | PRN
  Administered 2015-07-17: 32.8 via INTRAVENOUS
  Filled 2015-07-17: qty 33

## 2015-07-17 MED ORDER — TECHNETIUM TC 99M TETROFOSMIN IV KIT
10.5000 | PACK | Freq: Once | INTRAVENOUS | Status: AC | PRN
  Administered 2015-07-17: 11 via INTRAVENOUS
  Filled 2015-07-17: qty 11

## 2015-07-21 ENCOUNTER — Telehealth: Payer: Self-pay | Admitting: *Deleted

## 2015-07-21 ENCOUNTER — Encounter: Payer: Self-pay | Admitting: Internal Medicine

## 2015-07-21 ENCOUNTER — Ambulatory Visit (INDEPENDENT_AMBULATORY_CARE_PROVIDER_SITE_OTHER): Payer: Medicare Other | Admitting: Internal Medicine

## 2015-07-21 VITALS — BP 150/90 | HR 60 | Ht 67.0 in | Wt 152.2 lb

## 2015-07-21 DIAGNOSIS — R5383 Other fatigue: Secondary | ICD-10-CM

## 2015-07-21 DIAGNOSIS — I480 Paroxysmal atrial fibrillation: Secondary | ICD-10-CM | POA: Diagnosis not present

## 2015-07-21 DIAGNOSIS — Z1321 Encounter for screening for nutritional disorder: Secondary | ICD-10-CM

## 2015-07-21 DIAGNOSIS — R001 Bradycardia, unspecified: Secondary | ICD-10-CM | POA: Diagnosis not present

## 2015-07-21 DIAGNOSIS — R000 Tachycardia, unspecified: Secondary | ICD-10-CM

## 2015-07-21 DIAGNOSIS — I483 Typical atrial flutter: Secondary | ICD-10-CM | POA: Diagnosis not present

## 2015-07-21 NOTE — Progress Notes (Signed)
Patient Care Team: Tanda Rockers, MD as PCP - General (Pulmonary Disease) Chucky May, MD as Consulting Physician (Psychiatry)   HPI  [name] Browning is a 75 y.o. male  He has a history of atrial flutter which he underwent catheter ablation June 2013.  He was seen a year ago because of irregular palpitations as identified by his urologist. The concern was atrial fibrillation. Nothing has been identified \ He continues to complain of fatigue. He states it may be related to depression. He has been on Prozac for about 30 years. Was prescribed by a Dr. Toy Care   Echo 5/17 normal LV function    Records and Results Reviewed  outpt records;  Myoview demonstrated no ischemia heart rate excursion was adequate the peak heart rate of 127   Past Medical History  Diagnosis Date  . ADD (attention deficit disorder)   . Hyperlipidemia   . DJD (degenerative joint disease) of knee   . Depression   . Anxiety   . Cataracts, bilateral   . Heart murmur     a. 06/2011 Echo: 65-70%, mild MR  . Atrial flutter ([place])     a. s/p RFCA 07/2011.  Xarelto d/c 08/2011.  . S/P cardiac cath     a. in 1990's - Dr. Gwenlyn Found - WNL  . Conjunctivitis 09/14/2013  . Dysrhythmia     hx a-flutter with successful ablation 2013  . Sleep apnea     could not tolerate cpap, uses mouth guard now    Past Surgical History  Procedure Laterality Date  . Wrist fracture surgery  2007    left  . Atrial flutter ablation N/A 08/06/2011    Procedure: ATRIAL FLUTTER ABLATION;  Surgeon: Deboraha Sprang, MD;  [facility];  Service: Cardiovascular;  Laterality: N/A;  . Lesion removal N/A 08/02/2014    Procedure: EXCISION UPPER LIP VASCULAR ANOMALY ;  Surgeon: Irene Limbo, MD;  Location: [place];  Service: Plastics;  Laterality: N/A;    Current Outpatient Prescriptions  Medication Sig Dispense Refill  . LORazepam (ATIVAN) 1 MG tablet Take 1 mg by mouth daily.    . Multiple Vitamin tablet Take  1 tablet by mouth daily.    . potassium chloride (K-DUR,KLOR-CON) 10 MEQ tablet Take 10 mEq by mouth daily.    . psyllium (METAMUCIL) 58.6 % powder Take 1 packet by mouth daily.    [name] RAPAFLO 8 MG CAPS capsule Take 8 mg by mouth daily with breakfast.     . traZODone (DESYREL) 50 MG tablet Take 100 mg by mouth at bedtime.      No current facility-administered medications for this visit.    No Known Allergies    Review of Systems negative except from HPI and PMH  Physical Exam BP 150/90 mmHg  Pulse 60  Ht 5\' 7"  (1.702 m)  Wt 152 lb 3.2 oz (69.037 kg)  BMI 23.83 kg/m2 Well developed and well nourished in no acute distress HENT normal E scleral and icterus clear Neck Supple JVP flat; carotids brisk and full Clear to ausculation  Regular rate and rhythm, no murmurs gallops or rub Soft with active bowel sounds No clubbing cyanosis  Edema Alert and oriented, grossly normal motor and sensory function Skin Warm and Dry  ECG sinus 53 22/09/42 Nonspecific T Assessment and  Plan Tachycardia   Hx of Atrial flutter  Hypertension  Sinus bradycardia  Fatigue     As relates to fatigue blood  work has not been checked in a year. We'll check a CBC vitamin D thyroid status chemistries. It is his impression and his wife supported that depression may be the cause of the fatigue. He has been on Prozac for many years. This was not on the [facility]. He will follow-up with his psychiatrist.

## 2015-07-21 NOTE — Patient Instructions (Addendum)

## 2015-07-21 NOTE — Telephone Encounter (Signed)
-----   Message from Patsey Berthold, NP sent at 07/18/2015  8:50 AM EDT ----- Please notify patient of low risk myoview

## 2015-08-14 ENCOUNTER — Other Ambulatory Visit: Payer: Self-pay | Admitting: Internal Medicine

## 2015-08-14 ENCOUNTER — Telehealth: Payer: Self-pay | Admitting: Gastroenterology

## 2015-08-14 ENCOUNTER — Encounter (INDEPENDENT_AMBULATORY_CARE_PROVIDER_SITE_OTHER): Payer: Self-pay

## 2015-08-14 ENCOUNTER — Other Ambulatory Visit (INDEPENDENT_AMBULATORY_CARE_PROVIDER_SITE_OTHER): Payer: Medicare Other

## 2015-08-14 ENCOUNTER — Ambulatory Visit (INDEPENDENT_AMBULATORY_CARE_PROVIDER_SITE_OTHER): Payer: Medicare Other | Admitting: Internal Medicine

## 2015-08-14 ENCOUNTER — Encounter: Payer: Self-pay | Admitting: Internal Medicine

## 2015-08-14 VITALS — BP 134/78 | HR 67 | Ht 66.5 in | Wt 149.0 lb

## 2015-08-14 DIAGNOSIS — E785 Hyperlipidemia, unspecified: Secondary | ICD-10-CM | POA: Diagnosis not present

## 2015-08-14 DIAGNOSIS — D638 Anemia in other chronic diseases classified elsewhere: Secondary | ICD-10-CM | POA: Diagnosis not present

## 2015-08-14 DIAGNOSIS — Z8719 Personal history of other diseases of the digestive system: Secondary | ICD-10-CM

## 2015-08-14 DIAGNOSIS — I10 Essential (primary) hypertension: Secondary | ICD-10-CM

## 2015-08-14 LAB — CBC WITH DIFFERENTIAL/PLATELET
BASOS PCT: 0.4 % (ref 0.0–3.0)
Basophils Absolute: 0 10*3/uL (ref 0.0–0.1)
EOS ABS: 0.2 10*3/uL (ref 0.0–0.7)
Eosinophils Relative: 4.1 % (ref 0.0–5.0)
HEMATOCRIT: 40.2 % (ref 39.0–52.0)
Hemoglobin: 13.8 g/dL (ref 13.0–17.0)
LYMPHS ABS: 1.2 10*3/uL (ref 0.7–4.0)
LYMPHS PCT: 23.4 % (ref 12.0–46.0)
MCHC: 34.4 g/dL (ref 30.0–36.0)
MCV: 90.1 fl (ref 78.0–100.0)
MONOS PCT: 10.4 % (ref 3.0–12.0)
Monocytes Absolute: 0.5 10*3/uL (ref 0.1–1.0)
NEUTROS ABS: 3.2 10*3/uL (ref 1.4–7.7)
NEUTROS PCT: 61.7 % (ref 43.0–77.0)
PLATELETS: 307 10*3/uL (ref 150.0–400.0)
RBC: 4.46 Mil/uL (ref 4.22–5.81)
RDW: 12.8 % (ref 11.5–15.5)
WBC: 5.2 10*3/uL (ref 4.0–10.5)

## 2015-08-14 LAB — LIPID PANEL
CHOL/HDL RATIO: 6
CHOLESTEROL: 280 mg/dL — AB (ref 0–200)
HDL: 50.6 mg/dL (ref >39.00)
LDL CALC: 207 mg/dL — AB (ref 0–99)
NonHDL: 229.37
TRIGLYCERIDES: 112 mg/dL (ref 0.0–149.0)
VLDL: 22.4 mg/dL (ref 0.0–40.0)

## 2015-08-14 LAB — BASIC METABOLIC PANEL
BUN: 19 mg/dL (ref 6–23)
CO2: 30 mEq/L (ref 19–32)
CREATININE: 1.27 mg/dL (ref 0.40–1.50)
Calcium: 9.7 mg/dL (ref 8.4–10.5)
Chloride: 104 mEq/L (ref 96–112)
GFR: 58.84 mL/min — ABNORMAL LOW (ref >60.00)
Glucose, Bld: 105 mg/dL — ABNORMAL HIGH (ref 70–99)
Potassium: 4.4 mEq/L (ref 3.5–5.1)
Sodium: 140 mEq/L (ref 135–145)

## 2015-08-14 LAB — TSH: TSH: 1.79 u[IU]/mL (ref 0.35–4.50)

## 2015-08-14 LAB — HEPATIC FUNCTION PANEL
ALK PHOS: 76 U/L (ref 39–117)
ALT: 16 U/L (ref 0–53)
AST: 15 U/L (ref 0–37)
Albumin: 4.6 g/dL (ref 3.5–5.2)
BILIRUBIN DIRECT: 0.1 mg/dL (ref 0.0–0.3)
BILIRUBIN TOTAL: 0.6 mg/dL (ref 0.2–1.2)
TOTAL PROTEIN: 7.7 g/dL (ref 6.0–8.3)

## 2015-08-14 MED ORDER — ROSUVASTATIN CALCIUM 5 MG PO TABS: 5.0000 mg | ORAL_TABLET | Freq: Every day | ORAL | Status: DC

## 2015-08-14 NOTE — Progress Notes (Signed)
Subjective:     Patient ID: [name] Browning, male   DOB: June 12, 1940   MRN: [id number]   Brief patient profile:  67 yowm quit smoking 1970  with history of hyperlipidemia, allergic rhinitis , and depression.    History of Present Illness  03/2007--presented for CPX-hx of an abnormal EKG leading to a left heart catheter in 1994 that was felt to be completely normal and therefore felt to represent a false positive EKG       06/18/2011 f/u ov/[name] Browning cc aflutter noted at @ colonoscopy  06/14/11 and pt completely asym x "a little more fatigued" s palp, cp, sob, presyncope.  Denies excess caffeine or decongestant use Imp RAF rec Increase aspirin to 325 mg one daily but take the coated type> cards eval    08/06/11 - s/p successful RFA   - CHADS-VASc score of 1    D/c zocor around  Early Dec 2015 due to concerns with cognition> some better    04/02/2014 f/u ov/[name] Browning re: hyperlipidemia/ > ? Adverse effect of zocor  Chief Complaint  Patient presents with  . Follow-up    Pt states doing well. His cough has resolved. He has not had any more confusion. No new co's today.   still using peppermint /chocolate but no cough or sob  rec All follow up on Vit D per Dr Evelene Croon re levels and recommendations for treatment unless/until she turns it over to me  Late add : restart zocor at 20 mg per day and observe for any ams       07/22/2014 f/u ov/[name] Browning re: hyperlipidemia/ h/o afib/ on androgel s urology f/u  Chief Complaint  Patient presents with  . Follow-up    Pt has no complaints today- feels well.    No change in memory / back on zocor 20 mg daily  No tia/ claudication or  cp or chest tightness, subjective wheeze overt sinus or hb symptoms. No unusual exp hx or h/o childhood pna/ asthma or knowledge of premature birth. rec Be sure to call [facility] re follow up  Prevnar-13 today    08/14/2015  f/u ov/[name] Browning re: rhinitis/ takes benadryl helps insomnia plus trazadone/ high chol Chief Complaint  Patient  presents with  . Annual Exam    Pt is fasting. He c/o fatigue and diarrhea off and on since Jan 2017.   stopped adderal in Fall 2016 Changed metamucil to summer 2016  Episodes of loose bm Jan 2017 with fecal urgency/ incontinence Seeing urology August 2017 for annual eval on androgen gel and pos fm hx prostate ca  No  Tia/ claudication/ sob chronic cough or cp or chest tightness, subjective wheeze or overt  hb symptoms. No unusual exp hx or h/o childhood pna/ asthma or knowledge of premature birth.  Sleeping ok without nocturnal  or early am exacerbation  of respiratory  c/o's or need for noct saba. Also denies any obvious fluctuation of symptoms with weather or environmental changes or other aggravating or alleviating factors except as outlined above   Current Medications, Allergies, Complete Past Medical History, Past Surgical History, Family History, and Social History were reviewed in [employer] record.  ROS  The following are not active complaints unless bolded sore throat, dysphagia, dental problems, itching, sneezing,  nasal congestion or excess/ purulent secretions, ear ache,   fever, chills, sweats, unintended wt loss, classically pleuritic or exertional cp, hemoptysis,  orthopnea pnd or leg swelling, presyncope, palpitations, abdominal pain, anorexia, nausea, vomiting, diarrhea  or  change in bowel or bladder habits, change in stools or urine, dysuria,hematuria,  rash, arthralgias, visual complaints, headache, numbness, weakness or ataxia or problems with walking or coordination,  change in mood/affect or memory.                 Past Medical History:  Adult ADD........................................................................................Marland KitchenKaur  Hyperlipidemia  - Target LDL < 130 male, type A, pos fm hx  hemorrhoidal bleeding......................................................................  [place]  - colonoscopy 05/22/2001  And 06/14/11 with  AVM's sleep apnea   CPAP titration to 10 recommended 10/08/2002 for a baseline RDI of 33  - stopped 2007  Testerone def...................................................................................[name]  [facility]  Health Maintenance............................................................................Marland KitchenWert  - DT 05/2011  - Pneumovax April 15, 2008,  Prevnar 13 07/22/2014   - CPX 08/14/2015   DJD Left Knee....................................................................................Marland KitchenGioffree  New A flutter 06/18/2011 ........................................................................[name] [place] Cards          Objective:   Physical Exam  GEN: A/Ox3; pleasant , NAD, slightly anxious     wt 153 February 07, 2008 > > 147 September 15, 2009 > 151 April 30, 2010 > 152 06/08/2011 > 06/18/2011  149> 09/26/2012  146  > 02/27/2013 148 > 06/15/2013  148> 02/19/2014  150 > 04/02/2014  144> 07/22/14  146  > 11/18/2014  151  > 08/14/2015   149   HEENT: [employer]/AT, , EACs-clear, TMs-wnl, NOSE-clear, THROAT-clear  EARS - R wax impaction/ L ok  NECK: Supple w/ fair ROM; no JVD; normal carotid impulses w/o bruits; no thyromegaly or nodules palpated; no lymphadenopathy.  CHEST   Completely clear to A and P   RRR, no m/r/g  Pulse 70 and perfectly regular ABDOMEN: Soft & nt; nml bowel sounds; no organomegaly or masses detected.  EXT: Warm bil, no calf pain, edema, clubbing, pulses ok x L DP reduced MS nl gait, no deformity Neuro nl sensorium, no tremors, no motor or cerebellar deficits/ full recall of recent events Skin - no rash         Labs ordered/ reviewed:      Chemistry      Component Value Date/Time   NA 140 08/14/2015 1053   K 4.4 08/14/2015 1053   CL 104 08/14/2015 1053   CO2 30 08/14/2015 1053   BUN 19 08/14/2015 1053   CREATININE 1.27 08/14/2015 1053      Component Value Date/Time   CALCIUM 9.7 08/14/2015 1053   ALKPHOS 76 08/14/2015 1053   AST 15 08/14/2015 1053   ALT 16 08/14/2015 1053    BILITOT 0.6 08/14/2015 1053        Lab Results  Component Value Date   WBC 5.2 08/14/2015   HGB 13.8 08/14/2015   HCT 40.2 08/14/2015   MCV 90.1 08/14/2015   PLT 307.0 08/14/2015        Lab Results  Component Value Date   TSH 1.79 08/14/2015     Lab Results  Component Value Date   PROBNP 70.0 06/18/2011              Assessment:

## 2015-08-14 NOTE — Progress Notes (Signed)
Quick Note:  Spoke with the pt's spouse and notified of results/recs  Rx was sent to pharm ______

## 2015-08-14 NOTE — Telephone Encounter (Signed)
Left message on machine to call back  

## 2015-08-14 NOTE — Patient Instructions (Signed)
Please see patient coordinator before you leave today  to schedule GI eval for episodic diarrhea - Dr Ardis Hughs  Please remember to go to the lab  department downstairs for your tests - we will call you with the results when they are available.  We will need to get you a primary care doctor here by this time next year but I can see you in the meantime as needed

## 2015-08-15 NOTE — Assessment & Plan Note (Signed)
-   Fe studies ok 06/15/13 > stool cards received 06/27/2013 > neg x 5> down to 11.4 08/23/13  > referred for hematology 09/04/2013> Alvy Bimler felt it was due to low Testosterone  Resolved as of 08/14/2015

## 2015-08-15 NOTE — Assessment & Plan Note (Signed)
Adequate control on present rx, reviewed > no change in rx needed  = diet 

## 2015-08-15 NOTE — Assessment & Plan Note (Signed)
Lab Results  Component Value Date   CHOL 280* 08/14/2015   HDL 50.60 08/14/2015   LDLCALC 207* 08/14/2015   TRIG 112.0 08/14/2015   CHOLHDL 6 08/14/2015     rec restart crestor 5 mg daily as did not tol high dose zocor

## 2015-08-15 NOTE — Assessment & Plan Note (Signed)
-   colonoscopy 05/22/2001  And 06/14/11 > c/w hemorrhoids and cecal avm  rec reconsult GI re fecal incontinence

## 2015-08-18 ENCOUNTER — Telehealth: Payer: Self-pay | Admitting: Internal Medicine

## 2015-08-18 NOTE — Telephone Encounter (Signed)
I called and spoke with the patient's wife (DPR) regarding his lipid panel done with Dr. Melvyn Novas.  She was concerned as the patient had come off Crestor for memory issues and now he has been asked to restart this.  She was concerned that his numbers may not be bad enough for this, but she reports they were not given the actual numbers for his lipid panel. I discussed this with her and compared them to his readings a year ago. Cholesterol now- 280, LDL now- 207. I advised her he should be on a statin, but if he has not tolerated Crestor, they should re-visit another option with Dr. Melvyn Novas.  She is agreeable. Info mailed to the patient/ his wife for a low fat diet.

## 2015-08-18 NOTE — Telephone Encounter (Signed)
New message    Wife calling PCP put patient back on statin drug because his bad cholesterol was elevated.     Wife is asking for information on how decrease bad cholesterol.

## 2015-08-21 ENCOUNTER — Ambulatory Visit (HOSPITAL_COMMUNITY): Admit: 2015-08-21 | Payer: Self-pay | Admitting: Internal Medicine

## 2015-08-21 ENCOUNTER — Encounter (HOSPITAL_COMMUNITY): Payer: Self-pay

## 2015-08-21 SURGERY — ECHOCARDIOGRAM, TRANSESOPHAGEAL
Anesthesia: Moderate Sedation

## 2015-08-22 NOTE — Telephone Encounter (Signed)
Pt has been scheduled for 10/24/15 230 pm

## 2015-08-28 DIAGNOSIS — H6122 Impacted cerumen, left ear: Secondary | ICD-10-CM | POA: Diagnosis not present

## 2015-10-17 DIAGNOSIS — L821 Other seborrheic keratosis: Secondary | ICD-10-CM | POA: Diagnosis not present

## 2015-10-17 DIAGNOSIS — D2372 Other benign neoplasm of skin of left lower limb, including hip: Secondary | ICD-10-CM | POA: Diagnosis not present

## 2015-10-17 DIAGNOSIS — D2262 Melanocytic nevi of left upper limb, including shoulder: Secondary | ICD-10-CM | POA: Diagnosis not present

## 2015-10-17 DIAGNOSIS — D2272 Melanocytic nevi of left lower limb, including hip: Secondary | ICD-10-CM | POA: Diagnosis not present

## 2015-10-17 DIAGNOSIS — D485 Neoplasm of uncertain behavior of skin: Secondary | ICD-10-CM | POA: Diagnosis not present

## 2015-10-17 DIAGNOSIS — Z85828 Personal history of other malignant neoplasm of skin: Secondary | ICD-10-CM | POA: Diagnosis not present

## 2015-10-21 DIAGNOSIS — N400 Enlarged prostate without lower urinary tract symptoms: Secondary | ICD-10-CM | POA: Diagnosis not present

## 2015-10-21 DIAGNOSIS — E291 Testicular hypofunction: Secondary | ICD-10-CM | POA: Diagnosis not present

## 2015-10-24 ENCOUNTER — Ambulatory Visit (INDEPENDENT_AMBULATORY_CARE_PROVIDER_SITE_OTHER): Payer: Medicare Other | Admitting: Gastroenterology

## 2015-10-24 ENCOUNTER — Encounter: Payer: Self-pay | Admitting: Gastroenterology

## 2015-10-24 ENCOUNTER — Encounter (INDEPENDENT_AMBULATORY_CARE_PROVIDER_SITE_OTHER): Payer: Self-pay

## 2015-10-24 VITALS — BP 116/62 | HR 68 | Ht 66.0 in | Wt 153.2 lb

## 2015-10-24 DIAGNOSIS — R1310 Dysphagia, unspecified: Secondary | ICD-10-CM | POA: Diagnosis not present

## 2015-10-24 DIAGNOSIS — R159 Full incontinence of feces: Secondary | ICD-10-CM

## 2015-10-24 DIAGNOSIS — R197 Diarrhea, unspecified: Secondary | ICD-10-CM | POA: Diagnosis not present

## 2015-10-24 NOTE — Patient Instructions (Addendum)
Cut back your caffeine intake by 1/2 Cut back you fiber supplement intake by 1/3 Stool for FOBT testing kits time two. Please return to see Dr. Ardis Hughs on 01/13/16 9 am. Start ranitidine 150mg  pill, one pill at bedtime nightly.

## 2015-10-24 NOTE — Progress Notes (Signed)
HPI: This is a   very pleasant 75 year old man  who was referred to me by Tanda Rockers, MD  to evaluate  intermittent loose stools, fecal incontinence .    Chief complaint is intermittent loose stools, fecal incontinence   6 months of fecal incontinence.  Extreme urgency.  No overt bleeding.   These 3 episodes are discrete, pretty watery.  Between the episodes his bowels are perfectly normal for him.  He was on adderall, stopped 8 months ago.  No new medicines.  Has been on fiber supplements for many years.  Not associated however.  No abd pains.  No antibiotivcs recently.   He had a colonoscopy with Dr. Verl Blalock April 2013 for routine risk screening. Dr. Sharlett Iles documented some nonbleeding small cecal AVMs. The examination was otherwise normal and he recommended no further colon cancer screening given his age.  bloodwork 07/2015: cbc, cmet, tsh all normal.  His wife says he wakes up frequently in the middle the night with hiccups. He is not aware of this. He does not have any classic GERD symptoms. He does have mild intermittent dysphagia to pasta only.   Review of systems: Pertinent positive and negative review of systems were noted in the above HPI section. Complete review of systems was performed and was otherwise normal.   Past Medical History:  Diagnosis Date  . ADD (attention deficit disorder)   . Anxiety   . Atrial flutter ([place])    a. s/p RFCA 07/2011.  Xarelto d/c 08/2011.  . Cataracts, bilateral   . Conjunctivitis 09/14/2013  . Depression   . DJD (degenerative joint disease) of knee   . Dysrhythmia    hx a-flutter with successful ablation 2013  . Heart murmur    a. 06/2011 Echo: 65-70%, mild MR  . Hyperlipidemia   . S/P cardiac cath    a. in 1990's - Dr. Gwenlyn Found - WNL  . Sleep apnea    could not tolerate cpap, uses mouth guard now    Past Surgical History:  Procedure Laterality Date  . ATRIAL FLUTTER ABLATION N/A 08/06/2011   Procedure: ATRIAL  FLUTTER ABLATION;  Surgeon: Deboraha Sprang, MD;  Location: [facility] CATH LAB;  Service: Cardiovascular;  Laterality: N/A;  . LESION REMOVAL N/A 08/02/2014   Procedure: EXCISION UPPER LIP VASCULAR ANOMALY ;  Surgeon: Irene Limbo, MD;  Location: [facility];  Service: Plastics;  Laterality: N/A;  . WRIST FRACTURE SURGERY  2007   left    Current Outpatient Prescriptions  Medication Sig Dispense Refill  . buPROPion (WELLBUTRIN XL) 150 MG 24 hr tablet     . FLUoxetine (PROZAC) 20 MG capsule Take 1 capsule by mouth daily.  1  . LORazepam (ATIVAN) 1 MG tablet Take one tablet (1 mg) by mouth once daily as needed    . Multiple Vitamin tablet Take 1 tablet by mouth daily.    . psyllium (METAMUCIL) 58.6 % powder Take 1 packet by mouth daily.    [name] RAPAFLO 8 MG CAPS capsule Take 8 mg by mouth daily with breakfast.     . rosuvastatin (CRESTOR) 5 MG tablet     . traZODone (DESYREL) 50 MG tablet Take 100 mg by mouth at bedtime.      No current facility-administered medications for this visit.     Allergies as of 10/24/2015  . (No Known Allergies)    Family History  Problem Relation Age of Onset  . Heart disease Mother   . Leukemia Mother   .  Prostate cancer Father   . Stroke Father   . Hypertension Brother   . Prostate cancer Brother   . Colon cancer Neg Hx   . Stomach cancer Neg Hx   . Heart attack Neg Hx     Social History   Social History  . Marital status: Married    Spouse name: N/A  . Number of children: N/A  . Years of education: N/A   Occupational History  . RetiredWater quality scientist for [employer]    Social History Main Topics  . Smoking status: Former Smoker    Types: Cigarettes    Quit date: 02/16/1968  . Smokeless tobacco: Never Used  . Alcohol use No  . Drug use: Unknown  . Sexual activity: Not on file   Other Topics Concern  . Not on file   Social History Narrative  . No narrative on file     Physical Exam: BP 116/62   Pulse 68   Ht 5\' 6"  (1.676  m)   Wt 153 lb 4 oz (69.5 kg)   BMI 24.74 kg/m  Constitutional: generally well-appearing Psychiatric: alert and oriented x3 Eyes: extraocular movements intact Mouth: oral pharynx moist, no lesions Neck: supple no lymphadenopathy Cardiovascular: heart regular rate and rhythm Lungs: clear to auscultation bilaterally Abdomen: soft, nontender, nondistended, no obvious ascites, no peritoneal signs, normal bowel sounds Extremities: no lower extremity edema bilaterally Skin: no lesions on visible extremities   Assessment and plan: 75 y.o. male with  intermittent fecal incontinence, no overt GI bleeding, overnight hiccups, intermittent PASTA only associated dysphagia  He drinks a fair amount of caffeine every day and is on full dose fiber supplements. I recommended he cut both of these back by one half. He is also going to get stool testing with fecal occult testing to sets and if any of those are positive I would like to repeat his colonoscopy. Likewise if he does not respond to the above conservative dietary measurements usually a colonoscopy. I also recommended he start bedtime H2 blocker for his overnight hiccups. Not sure what to make of his only dysphasia with pasta;  he is gaining weight since I doubt is anything serious and for now and observe him clinically and we are starting the H2 blocker noted above.    Owens Loffler, MD [facility] 10/24/2015, 2:42 PM  Cc: Tanda Rockers, MD

## 2015-10-30 ENCOUNTER — Other Ambulatory Visit (INDEPENDENT_AMBULATORY_CARE_PROVIDER_SITE_OTHER): Payer: Medicare Other

## 2015-10-30 DIAGNOSIS — R197 Diarrhea, unspecified: Secondary | ICD-10-CM | POA: Diagnosis not present

## 2015-10-30 LAB — HEMOCCULT SLIDES (X 3 CARDS)
FECAL OCCULT BLD: NEGATIVE
FECAL OCCULT BLD: POSITIVE — AB
OCCULT 1: NEGATIVE
OCCULT 1: POSITIVE — AB
OCCULT 2: NEGATIVE
OCCULT 2: POSITIVE — AB
OCCULT 3: NEGATIVE
OCCULT 3: NEGATIVE
OCCULT 4: NEGATIVE
OCCULT 4: NEGATIVE
OCCULT 5: NEGATIVE
OCCULT 5: POSITIVE — AB

## 2015-11-04 DIAGNOSIS — Z23 Encounter for immunization: Secondary | ICD-10-CM | POA: Diagnosis not present

## 2015-11-05 ENCOUNTER — Telehealth: Payer: Self-pay | Admitting: Gastroenterology

## 2015-11-05 NOTE — Telephone Encounter (Signed)
Dr Ardis Hughs the pt is continuing to have diarrhea and is leaving for [place] soon and is requesting to speak to only you.

## 2015-11-05 NOTE — Telephone Encounter (Signed)
I spoke with his wife. He will start taking imodium 2 pills every morning on a scheduled basis and then throughout the day PRN up to 8/day.

## 2015-11-21 ENCOUNTER — Ambulatory Visit (AMBULATORY_SURGERY_CENTER): Payer: Self-pay

## 2015-11-21 VITALS — Ht 66.0 in | Wt 152.6 lb

## 2015-11-21 DIAGNOSIS — R195 Other fecal abnormalities: Secondary | ICD-10-CM

## 2015-11-21 MED ORDER — SUPREP BOWEL PREP KIT 17.5-3.13-1.6 GM/177ML PO SOLN: 1.0000 | Freq: Once | ORAL | 0 refills | Status: AC

## 2015-11-21 NOTE — Progress Notes (Signed)
No allergies to eggs or soy No past problems with anesthesia No diet meds No home oxygen  Declined emmi 

## 2015-11-28 ENCOUNTER — Encounter: Payer: Self-pay | Admitting: Gastroenterology

## 2015-11-28 ENCOUNTER — Ambulatory Visit (AMBULATORY_SURGERY_CENTER): Payer: Medicare Other | Admitting: Gastroenterology

## 2015-11-28 VITALS — BP 140/66 | HR 60 | Temp 98.6°F | Resp 17 | Ht 66.0 in | Wt 153.0 lb

## 2015-11-28 DIAGNOSIS — R197 Diarrhea, unspecified: Secondary | ICD-10-CM | POA: Diagnosis not present

## 2015-11-28 DIAGNOSIS — I4892 Unspecified atrial flutter: Secondary | ICD-10-CM | POA: Diagnosis not present

## 2015-11-28 DIAGNOSIS — D123 Benign neoplasm of transverse colon: Secondary | ICD-10-CM | POA: Diagnosis not present

## 2015-11-28 DIAGNOSIS — R195 Other fecal abnormalities: Secondary | ICD-10-CM

## 2015-11-28 DIAGNOSIS — D124 Benign neoplasm of descending colon: Secondary | ICD-10-CM

## 2015-11-28 DIAGNOSIS — F329 Major depressive disorder, single episode, unspecified: Secondary | ICD-10-CM | POA: Diagnosis not present

## 2015-11-28 DIAGNOSIS — R194 Change in bowel habit: Secondary | ICD-10-CM | POA: Diagnosis not present

## 2015-11-28 DIAGNOSIS — F419 Anxiety disorder, unspecified: Secondary | ICD-10-CM | POA: Diagnosis not present

## 2015-11-28 DIAGNOSIS — K219 Gastro-esophageal reflux disease without esophagitis: Secondary | ICD-10-CM | POA: Diagnosis not present

## 2015-11-28 MED ORDER — SODIUM CHLORIDE 0.9 % IV SOLN: 500.0000 mL | INTRAVENOUS | Status: DC

## 2015-11-28 NOTE — Patient Instructions (Signed)
YOU HAD AN ENDOSCOPIC PROCEDURE TODAY AT [facility]:   Refer to the procedure report that was given to you for any specific questions about what was found during the examination.  If the procedure report does not answer your questions, please call your gastroenterologist to clarify.  If you requested that your care partner not be given the details of your procedure findings, then the procedure report has been included in a sealed envelope for you to review at your convenience later.  YOU SHOULD EXPECT: Some feelings of bloating in the abdomen. Passage of more gas than usual.  Walking can help get rid of the air that was put into your GI tract during the procedure and reduce the bloating. If you had a lower endoscopy (such as a colonoscopy or flexible sigmoidoscopy) you may notice spotting of blood in your stool or on the toilet paper. If you underwent a bowel prep for your procedure, you may not have a normal bowel movement for a few days.  Please Note:  You might notice some irritation and congestion in your nose or some drainage.  This is from the oxygen used during your procedure.  There is no need for concern and it should clear up in a day or so.  SYMPTOMS TO REPORT IMMEDIATELY:   Following lower endoscopy (colonoscopy or flexible sigmoidoscopy):  Excessive amounts of blood in the stool  Significant tenderness or worsening of abdominal pains  Swelling of the abdomen that is new, acute  Fever of 100F or higher   For urgent or emergent issues, a gastroenterologist can be reached at any hour by calling [phone].   DIET:  We do recommend a small meal at first, but then you may proceed to your regular diet.  Drink plenty of fluids but you should avoid alcoholic beverages for 24 hours.  ACTIVITY:  You should plan to take it easy for the rest of today and you should NOT DRIVE or use heavy machinery until tomorrow (because of the sedation medicines used during the test).     FOLLOW UP: Our staff will call the number listed on your records the next business day following your procedure to check on you and address any questions or concerns that you may have regarding the information given to you following your procedure. If we do not reach you, we will leave a message.  However, if you are feeling well and you are not experiencing any problems, there is no need to return our call.  We will assume that you have returned to your regular daily activities without incident.  If any biopsies were taken you will be contacted by phone or by letter within the next 1-3 weeks.  Please call us at [phone] if you have not heard about the biopsies in 3 weeks.    SIGNATURES/CONFIDENTIALITY: You and/or your care partner have signed paperwork which will be entered into your electronic medical record.  These signatures attest to the fact that that the information above on your After Visit Summary has been reviewed and is understood.  Full responsibility of the confidentiality of this discharge information lies with you and/or your care-partner.  Hold Aspirin, Aleve and ibuprofen for two weeks post procedure to prevent bleeding.  Read all of the handouts given to you by your recovery room nurse.   The doctor will evaluate the pathology and then make a determination when you need to come back for another colonoscopy.

## 2015-11-28 NOTE — Progress Notes (Signed)
Report to PACU, RN, vss, BBS= Clear.  

## 2015-11-28 NOTE — Op Note (Signed)
[facility] Patient Name: [name] Browning Procedure Date: 11/28/2015 2:25 PM MRN: [id number] Endoscopist: Milus Banister , MD Age: 75 Referring MD:  Date of Birth: 01/02/1941 Gender: Male Account #: [id number] Procedure:                Colonoscopy Indications:              Change in bowel habits, intermittent diarrhea, FOBT                            +; colonoscopy Dr. Sharlett Iles 2013 was normal Medicines:                Monitored Anesthesia Care Procedure:                Pre-Anesthesia Assessment:                           - Prior to the procedure, a History and Physical                            was performed, and patient medications and                            allergies were reviewed. The patient's tolerance of                            previous anesthesia was also reviewed. The risks                            and benefits of the procedure and the sedation                            options and risks were discussed with the patient.                            All questions were answered, and informed consent                            was obtained. Prior Anticoagulants: The patient has                            taken no previous anticoagulant or antiplatelet                            agents. ASA Grade Assessment: II - A patient with                            mild systemic disease. After reviewing the risks                            and benefits, the patient was deemed in                            satisfactory condition to undergo the procedure.  After obtaining informed consent, the colonoscope                            was passed under direct vision. Throughout the                            procedure, the patient's blood pressure, pulse, and                            oxygen saturations were monitored continuously. The                            Model CF-HQ190L [id number]) scope was introduced                            through the  anus and advanced to the the cecum,                            identified by appendiceal orifice and ileocecal                            valve. The colonoscopy was performed without                            difficulty. The patient tolerated the procedure                            well. The quality of the bowel preparation was                            good. The ileocecal valve, appendiceal orifice, and                            rectum were photographed. Scope In: 2:28:30 PM Scope Out: 3:14:09 PM Scope Withdrawal Time: 0 hours 43 minutes 59 seconds  Total Procedure Duration: 0 hours 45 minutes 39 seconds  Findings:                 Two sessile polyps were found in the transverse                            colon. The polyps were 3 to 4 mm in size. These                            polyps were removed with a cold snare. Resection                            was complete, but the polyp tissue was only                            partially retrieved.                           A soft 35 mm polyp was found in the  descending                            colon. The polyp was multi-lobulated. The polyp was                            removed with a piecemeal technique using a hot                            snare. Resection and retrieval were complete. Area                            was tattooed with an injection of [place] ink.                           The exam was otherwise without abnormality.                           Biopsies for histology were taken with a cold                            forceps from the entire colon for evaluation of                            microscopic colitis.                           The terminal ileum appeared normal. Complications:            No immediate complications. Estimated blood loss:                            None. Estimated Blood Loss:     Estimated blood loss: none. Impression:               - Two 3 to 4 mm polyps in the transverse colon,                             removed with a cold snare. Complete resection.                            Partial retrieval.                           - One 35 mm polyp in the descending colon, removed                            piecemeal using a hot snare. Resected and                            retrieved. Tattooed.                           - The examination was otherwise normal, including  normal terminal ileum.                           - Biopsies were taken with a cold forceps from the                            entire colon for evaluation of microscopic colitis. Recommendation:           - Patient has a contact number available for                            emergencies. The signs and symptoms of potential                            delayed complications were discussed with the                            patient. Return to normal activities tomorrow.                            Written discharge instructions were provided to the                            patient.                           - Resume previous diet.                           - Continue present medications. NO ASPIRIN OR                            NSAIDS for 2 weeks                           You will receive a letter within 2-3 weeks with the                            pathology results and my final recommendations.                           If the polyp(s) is proven to be 'pre-cancerous' on                            pathology, you will need repeat colonoscopy in 3-4                            months Milus Banister, MD 11/28/2015 3:20:38 PM This report has been signed electronically.

## 2015-11-28 NOTE — Progress Notes (Signed)
Called to room to assist during endoscopic procedure.  Patient ID and intended procedure confirmed with present staff. Received instructions for my participation in the procedure from the performing physician.  

## 2015-12-01 ENCOUNTER — Telehealth: Payer: Self-pay

## 2015-12-01 NOTE — Telephone Encounter (Signed)
  Follow up Call-  Call back number 11/28/2015  Post procedure Call Back phone  # [phone] ceel.  Permission to leave phone message Yes  Some recent data might be hidden    Patient was called for follow up after his procedure on 11/28/2015. No answer at the number given for follow up phone call. A message was left on the answering machine.

## 2015-12-01 NOTE — Telephone Encounter (Signed)
  Follow up Call-  Call back number 11/28/2015  Post procedure Call Back phone  # [phone] ceel.  Permission to leave phone message Yes  Some recent data might be hidden    Patient was called for follow up after his procedure on 11/28/2015. No answer at the number given for follow up phone call. This was the second attempt to contact the patient.

## 2015-12-02 ENCOUNTER — Telehealth: Payer: Self-pay | Admitting: Gastroenterology

## 2015-12-02 NOTE — Telephone Encounter (Signed)
Left message on machine to call back  

## 2015-12-02 NOTE — Telephone Encounter (Signed)
Patient's wife is calling back wants to speak with Patty tomorrow morning 12/03/15.

## 2015-12-03 ENCOUNTER — Encounter: Payer: Self-pay | Admitting: Internal Medicine

## 2015-12-03 NOTE — Telephone Encounter (Signed)
Pt's wife called and wants to know why the path takes so long, she states her husband is in "bad shape"  She wants the path to be sped up.  She would like a personal call from Dr Ardis Hughs to discuss his condition.

## 2015-12-03 NOTE — Telephone Encounter (Signed)
Left message on machine to call back  

## 2015-12-05 ENCOUNTER — Encounter: Payer: Self-pay | Admitting: Gastroenterology

## 2015-12-05 NOTE — Telephone Encounter (Signed)
Path came back yesterday afternoon.  I left VM on their phone this morning to discuss.

## 2016-01-07 ENCOUNTER — Encounter: Payer: Self-pay | Admitting: Gastroenterology

## 2016-01-13 ENCOUNTER — Encounter: Payer: Self-pay | Admitting: Gastroenterology

## 2016-01-13 ENCOUNTER — Ambulatory Visit (INDEPENDENT_AMBULATORY_CARE_PROVIDER_SITE_OTHER): Payer: Medicare Other | Admitting: Gastroenterology

## 2016-01-13 VITALS — BP 120/72 | HR 72 | Ht 66.0 in | Wt 152.0 lb

## 2016-01-13 DIAGNOSIS — Z86010 Personal history of colonic polyps: Secondary | ICD-10-CM | POA: Diagnosis not present

## 2016-01-13 DIAGNOSIS — R197 Diarrhea, unspecified: Secondary | ICD-10-CM

## 2016-01-13 DIAGNOSIS — R1310 Dysphagia, unspecified: Secondary | ICD-10-CM

## 2016-01-13 MED ORDER — NA SULFATE-K SULFATE-MG SULF 17.5-3.13-1.6 GM/177ML PO SOLN: 1.0000 | Freq: Once | ORAL | 0 refills | Status: AC

## 2016-01-13 NOTE — Progress Notes (Signed)
Review of pertinent gastrointestinal problems: 1. colonoscopy with Dr. Verl Blalock April 2013 for routine risk screening. Dr. Sharlett Iles documented some nonbleeding small cecal AVMs. The examination was otherwise normal and he recommended no further colon cancer screening given his age. 2. Diarrhea evaluation 2017, Dr. Ardis Hughs bloodwork 07/2015: cbc, cmet, tsh all normal, FOBT +;  Colonoscopy 11/2015: normal TI, biopsies randomly of colon were normal; large TA in descending (3.5cm) removed piecemeal resection.  Recommended recall at 3-6 months.  HPI: This is a  very pleasant 75 year old man who is here with his wife today. I last saw the time of a colonoscopy about a month and a half ago. See those results summarized above.   Chief complaint is  diarrhea, intermittent dysphasia  His bowel habit changes have much improved since removing the large descending polyp from his colon last month.  He gets really nervous before playing golf and  takes an Imodium and then will not have a bowel movement every 2 or 3 days   With pasta or rice he has hiccups.  At times he will awake with hiccups at night.  This has been going on for 2 years.   he sometimes has the feeling of pasta and rice sticking in his esophagus, a dysphagia like symptoms. He has no troubles with liquids.    ROS: complete GI ROS as described in HPI.  Constitutional:  No unintentional weight loss   Past Medical History:  Diagnosis Date  . ADD (attention deficit disorder)   . Anxiety   . Atrial flutter ([place])    a. s/p RFCA 07/2011.  Xarelto d/c 08/2011.  . Cataracts, bilateral   . Conjunctivitis 09/14/2013  . Depression   . DJD (degenerative joint disease) of knee   . Dysrhythmia    hx a-flutter with successful ablation 2013  . Heart murmur    a. 06/2011 Echo: 65-70%, mild MR  . Hyperlipidemia   . S/P cardiac cath    a. in 1990's - Dr. Gwenlyn Found - WNL  . Sleep apnea    could not tolerate cpap, uses mouth guard now    Past  Surgical History:  Procedure Laterality Date  . ATRIAL FLUTTER ABLATION N/A 08/06/2011   Procedure: ATRIAL FLUTTER ABLATION;  Surgeon: Deboraha Sprang, MD;  Location: [facility] CATH LAB;  Service: Cardiovascular;  Laterality: N/A;  . LESION REMOVAL N/A 08/02/2014   Procedure: EXCISION UPPER LIP VASCULAR ANOMALY ;  Surgeon: Irene Limbo, MD;  Location: [place];  Service: Plastics;  Laterality: N/A;  . MASTOIDECTOMY    . TONSILLECTOMY    . WRIST FRACTURE SURGERY  2007   left    Current Outpatient Prescriptions  Medication Sig Dispense Refill  . ANDRODERM 4 MG/24HR PT24 patch     . buPROPion (WELLBUTRIN XL) 150 MG 24 hr tablet Take 1 tablet by mouth daily.    [name] LORazepam (ATIVAN) 1 MG tablet Take one tablet (1 mg) by mouth once daily as needed    . Multiple Vitamin tablet Take 1 tablet by mouth daily.    . ranitidine (ZANTAC) 150 MG tablet Take 150 mg by mouth 2 (two) times daily.    [name] RAPAFLO 8 MG CAPS capsule Take 8 mg by mouth daily with breakfast.     . traZODone (DESYREL) 50 MG tablet Take 100 mg by mouth at bedtime.      Current Facility-Administered Medications  Medication Dose Route Frequency Provider Last Rate Last Dose  . 0.9 %  sodium chloride  infusion  500 mL Intravenous Continuous Milus Banister, MD        Allergies as of 01/13/2016  . (No Known Allergies)    Family History  Problem Relation Age of Onset  . Heart disease Mother   . Leukemia Mother   . Prostate cancer Father   . Stroke Father   . Hypertension Brother   . Prostate cancer Brother   . Colon cancer Neg Hx   . Stomach cancer Neg Hx   . Heart attack Neg Hx     Social History   Social History  . Marital status: Married    Spouse name: N/A  . Number of children: N/A  . Years of education: N/A   Occupational History  . RetiredWater quality scientist for [employer]    Social History Main Topics  . Smoking status: Former Smoker    Types: Cigarettes    Quit date: 02/16/1968  . Smokeless  tobacco: Never Used  . Alcohol use No  . Drug use: No  . Sexual activity: Not on file   Other Topics Concern  . Not on file   Social History Narrative  . No narrative on file     Physical Exam: BP 120/72   Pulse 72   Ht 5\' 6"  (1.676 m)   Wt 152 lb (68.9 kg)   BMI 24.53 kg/m  Constitutional: generally well-appearing Psychiatric: alert and oriented x3 Abdomen: soft, nontender, nondistended, no obvious ascites, no peritoneal signs, normal bowel sounds No peripheral edema noted in lower extremities  Assessment and plan: 75 y.o. male wipersonal history of large precancerous polyp, resolved intermittent bowel changes, intermittent dysphasia  I recommended we proceed with barium esophagram for his mild intermittent dysphasia and if any clear strictures are noted then I would proceed with EGD with dilation. He had a pretty large polyp in his colon that was tubular adenoma. I do think that this was causing some periodic obstructive related diarrhea. His bowel changes have significantly improved since removing that polyp. I recommended he could probably stop taking Imodium before he plays golf since it is constipating him now.   [name] Loffler, MD [facility] 01/13/2016, 9:06 AM

## 2016-01-13 NOTE — Patient Instructions (Addendum)
Barium esophagram for intermittent dysphagia.    You have been scheduled for a Barium Esophogram at [facility] (1st floor of the hospital) on 01/23/16 at 930 am. Please arrive 15 minutes prior to your appointment for registration. Make certain not to have anything to eat or drink 6 hours prior to your test. If you need to reschedule for any reason, please contact radiology at [phone] to do so. __________________________________________________________________ A barium swallow is an examination that concentrates on views of the esophagus. This tends to be a double contrast exam (barium and two liquids which, when combined, create a gas to distend the wall of the oesophagus) or single contrast (non-ionic iodine based). The study is usually tailored to your symptoms so a good history is essential. Attention is paid during the study to the form, structure and configuration of the esophagus, looking for functional disorders (such as aspiration, dysphagia, achalasia, motility and reflux) EXAMINATION You may be asked to change into a gown, depending on the type of swallow being performed. A radiologist and radiographer will perform the procedure. The radiologist will advise you of the type of contrast selected for your procedure and direct you during the exam. You will be asked to stand, sit or lie in several different positions and to hold a small amount of fluid in your mouth before being asked to swallow while the imaging is performed .In some instances you may be asked to swallow barium coated marshmallows to assess the motility of a solid food bolus. The exam can be recorded as a digital or video fluoroscopy procedure. POST PROCEDURE It will take 1-2 days for the barium to pass through your system. To facilitate this, it is important, unless otherwise directed, to increase your fluids for the next 24-48hrs and to resume your normal diet.  This test typically takes about 30 minutes to  perform. __________________________________________________________________________________  Try cutting back on imodium before golf.  You have been scheduled for a colonoscopy. Please follow written instructions given to you at your visit today.  Please pick up your prep supplies at the pharmacy within the next 1-3 days. If you use inhalers (even only as needed), please bring them with you on the day of your procedure. Your physician has requested that you go to www.startemmi.com and enter the access code given to you at your visit today. This web site gives a general overview about your procedure. However, you should still follow specific instructions given to you by our office regarding your preparation for the procedure.

## 2016-01-23 ENCOUNTER — Ambulatory Visit (HOSPITAL_COMMUNITY)
Admission: RE | Admit: 2016-01-23 | Discharge: 2016-01-23 | Disposition: A | Discharge status: Home / Self Care | Payer: Medicare Other | Source: Ambulatory Visit | Attending: Gastroenterology | Admitting: Gastroenterology

## 2016-01-23 DIAGNOSIS — R066 Hiccough: Secondary | ICD-10-CM | POA: Diagnosis not present

## 2016-01-23 DIAGNOSIS — R1310 Dysphagia, unspecified: Secondary | ICD-10-CM | POA: Diagnosis not present

## 2016-01-23 DIAGNOSIS — Z86010 Personal history of colonic polyps: Secondary | ICD-10-CM | POA: Diagnosis not present

## 2016-01-28 ENCOUNTER — Encounter: Payer: Self-pay | Admitting: Internal Medicine

## 2016-02-04 ENCOUNTER — Ambulatory Visit (INDEPENDENT_AMBULATORY_CARE_PROVIDER_SITE_OTHER): Payer: Medicare Other | Admitting: Internal Medicine

## 2016-02-04 ENCOUNTER — Encounter: Payer: Self-pay | Admitting: Internal Medicine

## 2016-02-04 VITALS — BP 139/85 | HR 91 | Ht 66.0 in | Wt 151.4 lb

## 2016-02-04 DIAGNOSIS — R001 Bradycardia, unspecified: Secondary | ICD-10-CM | POA: Diagnosis not present

## 2016-02-04 DIAGNOSIS — I483 Typical atrial flutter: Secondary | ICD-10-CM

## 2016-02-04 DIAGNOSIS — R000 Tachycardia, unspecified: Secondary | ICD-10-CM | POA: Diagnosis not present

## 2016-02-04 NOTE — Progress Notes (Signed)
Patient Care Team: Tanda Rockers, MD as PCP - General (Pulmonary Disease) Chucky May, MD as Consulting Physician (Psychiatry)   HPI  [name] Browning is a 75 y.o. male  He has a history of atrial flutter which he underwent catheter ablation June 2013.  He was seen a year ago because of irregular palpitations as identified by his urologist. The concern was atrial fibrillation. Nothing has been identified  No recent palpitations  He does have cough for the last few weeks without purulnet sputum, but some sputum   He continues to complain of fatigue. He states it may be related to depression. He has been on Prozac for about 30 years. Was prescribed by a Dr. Toy Care they have recently tried to cahnge antidepressants   Echo 5/17 normal LV function    Records and Results Reviewed  outpt records;  Myoview demonstrated no ischemia heart rate excursion was adequate the peak heart rate of 127   Past Medical History:  Diagnosis Date  . ADD (attention deficit disorder)   . Anxiety   . Atrial flutter ([place])    a. s/p RFCA 07/2011.  Xarelto d/c 08/2011.  . Cataracts, bilateral   . Conjunctivitis 09/14/2013  . Depression   . DJD (degenerative joint disease) of knee   . Dysrhythmia    hx a-flutter with successful ablation 2013  . Heart murmur    a. 06/2011 Echo: 65-70%, mild MR  . Hyperlipidemia   . S/P cardiac cath    a. in 1990's - Dr. Gwenlyn Found - WNL  . Sleep apnea    could not tolerate cpap, uses mouth guard now    Past Surgical History:  Procedure Laterality Date  . ATRIAL FLUTTER ABLATION N/A 08/06/2011   Procedure: ATRIAL FLUTTER ABLATION;  Surgeon: Deboraha Sprang, MD;  Location: [facility] CATH LAB;  Service: Cardiovascular;  Laterality: N/A;  . LESION REMOVAL N/A 08/02/2014   Procedure: EXCISION UPPER LIP VASCULAR ANOMALY ;  Surgeon: Irene Limbo, MD;  Location: [place];  Service: Plastics;  Laterality: N/A;  . MASTOIDECTOMY    . TONSILLECTOMY    . WRIST  FRACTURE SURGERY  2007   left    Current Outpatient Prescriptions  Medication Sig Dispense Refill  . LORazepam (ATIVAN) 1 MG tablet as directed. Take one tablet (1 mg) by mouth once daily as needed    . Multiple Vitamin tablet Take 1 tablet by mouth daily.    . protriptyline (VIVACTIL) 10 MG tablet Take 1 tablet by mouth 3 (three) times daily.    . ranitidine (ZANTAC) 150 MG tablet Take 150 mg by mouth at bedtime.     [name] RAPAFLO 8 MG CAPS capsule Take 8 mg by mouth daily with breakfast.     . traZODone (DESYREL) 50 MG tablet Take 100 mg by mouth at bedtime.      Current Facility-Administered Medications  Medication Dose Route Frequency Provider Last Rate Last Dose  . 0.9 %  sodium chloride infusion  500 mL Intravenous Continuous Milus Banister, MD        No Known Allergies    Review of Systems negative except from HPI and PMH  Physical Exam BP 139/85   Pulse 91   Ht 5\' 6"  (1.676 m)   Wt 151 lb 6.4 oz (68.7 kg)   BMI 24.44 kg/m  Well developed and well nourished in no acute distress HENT normal E scleral and icterus clear Neck Supple JVP flat; carotids brisk  and full Clear to ausculation  egular rate and rhythm, no murmurs gallops or rub Soft with active bowel sounds No clubbing cyanosis  Edema Alert and oriented, grossly normal motor and sensory function Skin Warm and Dry  ECG sinus 91 21/08/33 Nonspecific T  Assessment and  Plan Tachypalpitations without documentation   Hx of Atrial flutter s/p ablation   Hypertension  Sinus bradycardia  Fatigue   Cough  Overall  Pt is doing reasonably well  Without specific cardiac issues Spoke withDr wert regarding coough  He recommended Rx for presumed GERD but in absence of purulent sputum no indication for antibiotics  BP well controlled   Sinus rates are better

## 2016-02-04 NOTE — Patient Instructions (Signed)

## 2016-02-27 ENCOUNTER — Encounter: Payer: Self-pay | Admitting: Gastroenterology

## 2016-02-27 ENCOUNTER — Ambulatory Visit (AMBULATORY_SURGERY_CENTER): Payer: Medicare Other | Admitting: Gastroenterology

## 2016-02-27 VITALS — BP 122/69 | HR 68 | Temp 97.8°F | Resp 11 | Ht 66.0 in | Wt 152.0 lb

## 2016-02-27 DIAGNOSIS — K635 Polyp of colon: Secondary | ICD-10-CM | POA: Diagnosis not present

## 2016-02-27 DIAGNOSIS — Z86010 Personal history of colonic polyps: Secondary | ICD-10-CM

## 2016-02-27 DIAGNOSIS — I4892 Unspecified atrial flutter: Secondary | ICD-10-CM | POA: Diagnosis not present

## 2016-02-27 DIAGNOSIS — G4733 Obstructive sleep apnea (adult) (pediatric): Secondary | ICD-10-CM | POA: Diagnosis not present

## 2016-02-27 DIAGNOSIS — D124 Benign neoplasm of descending colon: Secondary | ICD-10-CM

## 2016-02-27 DIAGNOSIS — D122 Benign neoplasm of ascending colon: Secondary | ICD-10-CM

## 2016-02-27 HISTORY — PX: COLONOSCOPY: SHX174

## 2016-02-27 MED ORDER — SODIUM CHLORIDE 0.9 % IV SOLN: 500.0000 mL | INTRAVENOUS | Status: DC

## 2016-02-27 NOTE — Progress Notes (Signed)
Called to room to assist during endoscopic procedure.  Patient ID and intended procedure confirmed with present staff. Received instructions for my participation in the procedure from the performing physician.  

## 2016-02-27 NOTE — Progress Notes (Signed)
Patient awakening,vss,report to rn 

## 2016-02-27 NOTE — Op Note (Signed)
[place] Patient Name: [name] Browning Procedure Date: 02/27/2016 2:45 PM MRN: [id number] Endoscopist: Milus Banister , MD Age: 76 Referring MD:  Date of Birth: December 09, 1940 Gender: Male Account #: [id number] Procedure:                Colonoscopy Indications:              High risk colon cancer surveillance: Personal                            history of colonic polyps; 2017 colonsocopy Dr.                            Ardis Hughs large TA removed from left colon piecemeal                            fashion, also two other small polyps Medicines:                Monitored Anesthesia Care Procedure:                Pre-Anesthesia Assessment:                           - Prior to the procedure, a History and Physical                            was performed, and patient medications and                            allergies were reviewed. The patient's tolerance of                            previous anesthesia was also reviewed. The risks                            and benefits of the procedure and the sedation                            options and risks were discussed with the patient.                            All questions were answered, and informed consent                            was obtained. Prior Anticoagulants: The patient has                            taken no previous anticoagulant or antiplatelet                            agents. ASA Grade Assessment: III - A patient with                            severe systemic disease. After reviewing the risks  and benefits, the patient was deemed in                            satisfactory condition to undergo the procedure.                           After obtaining informed consent, the colonoscope                            was passed under direct vision. Throughout the                            procedure, the patient's blood pressure, pulse, and                            oxygen saturations were  monitored continuously. The                            Model CF-HQ190L [id number]) scope was introduced                            through the anus and advanced to the the cecum,                            identified by appendiceal orifice and ileocecal                            valve. The colonoscopy was performed without                            difficulty. The patient tolerated the procedure                            well. The quality of the bowel preparation was                            excellent. The ileocecal valve, appendiceal                            orifice, and rectum were photographed. Scope In: 2:50:44 PM Scope Out: 3:03:17 PM Scope Withdrawal Time: 0 hours 10 minutes 27 seconds  Total Procedure Duration: 0 hours 12 minutes 33 seconds  Findings:                 A 2 mm polyp was found in the ascending colon. The                            polyp was sessile. The polyp was removed with a                            cold snare. Resection and retrieval were complete.                           The site of previous polyp resection in  descending                            colon was easily identified, just distal to SPOT                            tattoo. The mucosa at the site did not appear                            adenomatous however biopsies were taken with a cold                            forceps for histology to be certain.                           The exam was otherwise without abnormality on                            direct and retroflexion views. Complications:            No immediate complications. Estimated blood loss:                            None. Estimated Blood Loss:     Estimated blood loss: none. Impression:               - One 2 mm polyp in the ascending colon, removed                            with a cold snare. Resected and retrieved.                           - Site of large polyp removed in descending colon                            appeared to  have no residual polyp but was biopsied                            to be certain.                           - The examination was otherwise normal on direct                            and retroflexion views. Recommendation:           - Patient has a contact number available for                            emergencies. The signs and symptoms of potential                            delayed complications were discussed with the                            patient.  Return to normal activities tomorrow.                            Written discharge instructions were provided to the                            patient.                           - Resume previous diet.                           - Continue present medications.                           You will receive a letter within 2-3 weeks with the                            pathology results and my final recommendations.                            Likely you will need repeat colonsocopy in 3 years. Milus Banister, MD 02/27/2016 3:08:46 PM This report has been signed electronically.

## 2016-02-27 NOTE — Patient Instructions (Signed)
Impressions/reccomendations:  Polyp (handout given)  Repeat colonoscopy pending pathology results.  YOU HAD AN ENDOSCOPIC PROCEDURE TODAY AT [facility]:   Refer to the procedure report that was given to you for any specific questions about what was found during the examination.  If the procedure report does not answer your questions, please call your gastroenterologist to clarify.  If you requested that your care partner not be given the details of your procedure findings, then the procedure report has been included in a sealed envelope for you to review at your convenience later.  YOU SHOULD EXPECT: Some feelings of bloating in the abdomen. Passage of more gas than usual.  Walking can help get rid of the air that was put into your GI tract during the procedure and reduce the bloating. If you had a lower endoscopy (such as a colonoscopy or flexible sigmoidoscopy) you may notice spotting of blood in your stool or on the toilet paper. If you underwent a bowel prep for your procedure, you may not have a normal bowel movement for a few days.  Please Note:  You might notice some irritation and congestion in your nose or some drainage.  This is from the oxygen used during your procedure.  There is no need for concern and it should clear up in a day or so.  SYMPTOMS TO REPORT IMMEDIATELY:   Following lower endoscopy (colonoscopy or flexible sigmoidoscopy):  Excessive amounts of blood in the stool  Significant tenderness or worsening of abdominal pains  Swelling of the abdomen that is new, acute  Fever of 100F or higher  For urgent or emergent issues, a gastroenterologist can be reached at any hour by calling [phone].   DIET:  We do recommend a small meal at first, but then you may proceed to your regular diet.  Drink plenty of fluids but you should avoid alcoholic beverages for 24 hours.  ACTIVITY:  You should plan to take it easy for the rest of today and you should NOT  DRIVE or use heavy machinery until tomorrow (because of the sedation medicines used during the test).    FOLLOW UP: Our staff will call the number listed on your records the next business day following your procedure to check on you and address any questions or concerns that you may have regarding the information given to you following your procedure. If we do not reach you, we will leave a message.  However, if you are feeling well and you are not experiencing any problems, there is no need to return our call.  We will assume that you have returned to your regular daily activities without incident.  If any biopsies were taken you will be contacted by phone or by letter within the next 1-3 weeks.  Please call us at [phone] if you have not heard about the biopsies in 3 weeks.    SIGNATURES/CONFIDENTIALITY: You and/or your care partner have signed paperwork which will be entered into your electronic medical record.  These signatures attest to the fact that that the information above on your After Visit Summary has been reviewed and is understood.  Full responsibility of the confidentiality of this discharge information lies with you and/or your care-partner.

## 2016-03-01 ENCOUNTER — Telehealth: Payer: Self-pay | Admitting: *Deleted

## 2016-03-01 NOTE — Telephone Encounter (Signed)
  Follow up Call-  Call back number 02/27/2016 11/28/2015  Post procedure Call Back phone  # [phone] ceel.  Permission to leave phone message Yes Yes  Some recent data might be hidden     Patient questions:  Do you have a fever, pain , or abdominal swelling? No. Pain Score  0 *  Have you tolerated food without any problems? Yes.    Have you been able to return to your normal activities? Yes.    Do you have any questions about your discharge instructions: Diet   No. Medications  No. Follow up visit  No.  Do you have questions or concerns about your Care? No.  Actions: * If pain score is 4 or above: No action needed, pain <4.

## 2016-03-04 ENCOUNTER — Encounter: Payer: Self-pay | Admitting: Gastroenterology

## 2016-04-05 ENCOUNTER — Encounter: Payer: Self-pay | Admitting: Internal Medicine

## 2016-04-05 ENCOUNTER — Ambulatory Visit (INDEPENDENT_AMBULATORY_CARE_PROVIDER_SITE_OTHER): Payer: Medicare Other | Admitting: Internal Medicine

## 2016-04-05 ENCOUNTER — Ambulatory Visit (INDEPENDENT_AMBULATORY_CARE_PROVIDER_SITE_OTHER)
Admission: RE | Admit: 2016-04-05 | Discharge: 2016-04-05 | Disposition: A | Discharge status: Home / Self Care | Payer: Medicare Other | Source: Ambulatory Visit | Attending: Internal Medicine | Admitting: Internal Medicine

## 2016-04-05 ENCOUNTER — Other Ambulatory Visit (INDEPENDENT_AMBULATORY_CARE_PROVIDER_SITE_OTHER): Payer: Medicare Other

## 2016-04-05 VITALS — BP 130/84 | HR 62 | Temp 97.6°F | Ht 66.0 in | Wt 150.8 lb

## 2016-04-05 DIAGNOSIS — R05 Cough: Secondary | ICD-10-CM

## 2016-04-05 DIAGNOSIS — R059 Cough, unspecified: Secondary | ICD-10-CM

## 2016-04-05 LAB — CBC WITH DIFFERENTIAL/PLATELET
BASOS ABS: 0 10*3/uL (ref 0.0–0.1)
BASOS PCT: 0.4 % (ref 0.0–3.0)
EOS ABS: 0.4 10*3/uL (ref 0.0–0.7)
Eosinophils Relative: 5.8 % — ABNORMAL HIGH (ref 0.0–5.0)
HEMATOCRIT: 38 % — AB (ref 39.0–52.0)
Hemoglobin: 13.1 g/dL (ref 13.0–17.0)
LYMPHS ABS: 1.4 10*3/uL (ref 0.7–4.0)
LYMPHS PCT: 22 % (ref 12.0–46.0)
MCHC: 34.4 g/dL (ref 30.0–36.0)
MCV: 90.9 fl (ref 78.0–100.0)
MONO ABS: 0.6 10*3/uL (ref 0.1–1.0)
Monocytes Relative: 9.5 % (ref 3.0–12.0)
NEUTROS ABS: 3.9 10*3/uL (ref 1.4–7.7)
NEUTROS PCT: 62.3 % (ref 43.0–77.0)
PLATELETS: 321 10*3/uL (ref 150.0–400.0)
RBC: 4.18 Mil/uL — ABNORMAL LOW (ref 4.22–5.81)
RDW: 13.1 % (ref 11.5–15.5)
WBC: 6.2 10*3/uL (ref 4.0–10.5)

## 2016-04-05 MED ORDER — PANTOPRAZOLE SODIUM 40 MG PO TBEC: 40.0000 mg | DELAYED_RELEASE_TABLET | Freq: Every day | ORAL | 2 refills | Status: DC

## 2016-04-05 NOTE — Progress Notes (Signed)
Subjective:     Patient ID: [name] Browning, male   DOB: 05-18-1940   MRN: [id number]   Brief patient profile:  76 yowm quit smoking 1970  with history of hyperlipidemia, allergic rhinitis , and depression.    History of Present Illness  03/2007--presented for CPX-hx of an abnormal EKG leading to a left heart catheter in 1994 that was felt to be completely normal and therefore felt to represent a false positive EKG       06/18/2011 f/u ov/Deontae Robson cc aflutter noted at @ colonoscopy  06/14/11 and pt completely asym x "a little more fatigued" s palp, cp, sob, presyncope.  Denies excess caffeine or decongestant use Imp RAF rec Increase aspirin to 325 mg one daily but take the coated type> cards eval    08/06/11 - s/p successful RFA   - CHADS-VASc score of 1    D/c zocor around  Early Dec 2015 due to concerns with cognition> some better    04/02/2014 f/u ov/Garnell Begeman re: hyperlipidemia/ > ? Adverse effect of zocor  Chief Complaint  Patient presents with  . Follow-up    Pt states doing well. His cough has resolved. He has not had any more confusion. No new co's today.   still using peppermint /chocolate but no cough or sob  rec All follow up on Vit D per Dr Evelene Croon re levels and recommendations for treatment unless/until she turns it over to me  Late add : restart zocor at 20 mg per day and observe for any ams       07/22/2014 f/u ov/Lexianna Weinrich re: hyperlipidemia/ h/o afib/ on androgel s urology f/u  Chief Complaint  Patient presents with  . Follow-up    Pt has no complaints today- feels well.    No change in memory / back on zocor 20 mg daily  No tia/ claudication or  cp or chest tightness, subjective wheeze overt sinus or hb symptoms. No unusual exp hx or h/o childhood pna/ asthma or knowledge of premature birth. rec Be sure to call [facility] re follow up  Prevnar-13 today     04/05/2016 acute extended ov/Zuleima Haser re: new cough since early Dec 2017  Chief Complaint  Patient presents with  .  Acute Visit    Pt c/o cough and congestion off and on since Dec 2017.  His cough is occ prod with white sputum.    at onset did not feel like a usual head cold came on in early Dec 2017 assoc some sneezing  More day > noct not exac in am/ has not been using any otc's/ antihistamines but variably taking gerd rx otc- not clear at all what he really takes  Assoc with voice change but no cough with speaking  Assoc with sensation of gerd rx zantac ? Strength at hs / otc ppi ?  Also c/o abd swelling s change in bowel/bladder habits or abd pain     Kouffman Reflux v Neurogenic Cough Differentiator Reflux Comments  Do you awaken from a sound sleep coughing violently?                            With trouble breathing? no   Do you have choking episodes when you cannot  Get enough air, gasping for air ?              no   Do you usually cough when you lie down into  The bed, or when  you just lie down to rest ?                          some   Do you usually cough after meals or eating?         No    Do you cough when (or after) you bend over?    no   GERD SCORE     Kouffman Reflux v Neurogenic Cough Differentiator Neurogenic   Do you more-or-less cough all day long? sporadic   Does change of temperature make you cough? no   Does laughing or chuckling cause you to cough? no   Do fumes (perfume, automobile fumes, burned  Toast, etc.,) cause you to cough ?      sneeze   Does speaking, singing, or talking on the phone cause you to cough   ?               No    Neurogenic/Airway score      Does ok power walking s provoking cough / overall cough doing better since x 2 weeks   No obvious day to day or daytime variability or assoc excess/ purulent sputum or mucus plugs or hemoptysis or cp or chest tightness, subjective wheeze or overt sinus   symptoms. No unusual exp hx or h/o childhood pna/ asthma or knowledge of premature birth.  Sleeping ok without nocturnal  or early am exacerbation  of respiratory   c/o's or need for noct saba. Also denies any obvious fluctuation of symptoms with weather or environmental changes or other aggravating or alleviating factors except as outlined above   Current Medications, Allergies, Complete Past Medical History, Past Surgical History, Family History, and Social History were reviewed in [employer] record.  ROS  The following are not active complaints unless bolded sore throat, dysphagia, dental problems, itching, sneezing,  nasal congestion or excess/ purulent secretions, ear ache,   fever, chills, sweats, unintended wt loss, classically pleuritic or exertional cp,  orthopnea pnd or leg swelling, presyncope, palpitations, abdominal pain, anorexia, nausea, vomiting, diarrhea  or change in bowel or bladder habits, change in stools or urine, dysuria,hematuria,  rash, arthralgias, visual complaints, headache, numbness, weakness or ataxia or problems with walking or coordination,  change in mood/affect or memory.                          Past Medical History:  Adult ADD........................................................................................Marland KitchenKaur  Hyperlipidemia  - Target LDL < 130 male, type A, pos fm hx  hemorrhoidal bleeding......................................................................  [place]  - colonoscopy 05/22/2001  And 06/14/11 with AVM's sleep apnea   CPAP titration to 10 recommended 10/08/2002 for a baseline RDI of 33  - stopped 2007  Testerone def...................................................................................[name]  [facility]  Health Maintenance............................................................................Marland KitchenWert  - DT 05/2011  - Pneumovax April 15, 2008,  Prevnar 13 07/22/2014   - CPX 08/14/2015   DJD Left Knee....................................................................................Marland KitchenGioffree  New A flutter 06/18/2011  ........................................................................[name] [place] [facility]          Objective:   Physical Exam  GEN: A/Ox3;  NAD, slightly anxious Tends to let his wife do the talking "you told Dr Graciela Husbands it had to be reflux, well it isn't"   Vital signs reviewed  - Note on arrival 02 sats  98% on RA    wt 153 February 07, 2008 > > 147 September 15, 2009 > 151 April 30, 2010 > 152 06/08/2011 > 06/18/2011  149> 09/26/2012  146  > 02/27/2013 148 > 06/15/2013  148> 02/19/2014  150 > 04/02/2014  144> 07/22/14  146  > 11/18/2014  151  > 08/14/2015   149  > 04/05/2016   150    HEENT: [place]/AT, , EACs-clear, TMs-wnl, NOSE-clear, THROAT-clear  EARS - R wax impaction/ L ok  NECK: Supple w/ fair ROM; no JVD; normal carotid impulses w/o bruits; no thyromegaly or nodules palpated; no lymphadenopathy.  CHEST   Completely clear to A and P with no cough on inspiratory or expiratory maneuvers..   RRR, no m/r/g  Pulse 70 and perfectly regular ABDOMEN: mod distended/ no fluid wave or shifting dullness ; nml bowel sounds; no organomegaly or masses detected.  EXT: Warm bil, no calf pain, edema, clubbing, pulses ok x L DP reduced MS nl gait, no deformity Neuro nl sensorium, no tremors, no motor or cerebellar deficits  Skin - no rash    CXR PA and Lateral:   04/05/2016 :    I personally reviewed images and agree with radiology impression as follows:    The lungs are well-expanded with mild hemidiaphragm flattening. There is no focal infiltrate. There is no pleural effusion. The heart and pulmonary vascularity are normal. The mediastinum is normal in width. There is calcification in the wall of the aortic arch. The bony thorax exhibits no acute abnormality.    Labs ordered 04/05/2016   Allergy profile                       Assessment:

## 2016-04-05 NOTE — Patient Instructions (Addendum)
Pantoprazole (protonix) 40 mg   Take  30-60 min before first meal of the day and  Zantac 150 mg  one @  bedtime until return to office - this is the best way to tell whether stomach acid is contributing to your problem.   GERD (REFLUX)  is an extremely common cause of respiratory symptoms just like yours , many times with no obvious heartburn at all.    It can be treated with medication, but also with lifestyle changes including elevation of the head of your bed (ideally with 6 inch  bed blocks),  Smoking cessation, avoidance of late meals, excessive alcohol, and avoid fatty foods, chocolate, peppermint, colas, red wine, and acidic juices such as orange juice.  NO MINT OR MENTHOL PRODUCTS SO NO COUGH DROPS   USE SUGARLESS CANDY INSTEAD (Jolley ranchers or Stover's or Life Savers) or even ice chips will also do - the key is to swallow to prevent all throat clearing. NO OIL BASED VITAMINS - use powdered substitutes.    Please remember to go to the lab and x-ray department downstairs in the basement  for your tests - we will call you with the results when they are available.  If better come back in  July for CPX,  If not return with all active medications in hand

## 2016-04-06 ENCOUNTER — Telehealth: Payer: Self-pay | Admitting: Internal Medicine

## 2016-04-06 LAB — RESPIRATORY ALLERGY PROFILE REGION II ~~LOC~~
ALLERGEN, CEDAR TREE, T6: 0.12 kU/L — AB
ALLERGEN, COTTONWOOD, T14: 0.19 kU/L — AB
Allergen, A. alternata, m6: 1.28 kU/L — ABNORMAL HIGH
Allergen, C. Herbarum, M2: <0.1 kU/L
Allergen, Comm Silver Birch, t9: 0.11 kU/L — ABNORMAL HIGH
Allergen, D pternoyssinus,d7: 1.41 kU/L — ABNORMAL HIGH
Allergen, Mulberry, t76: <0.1 kU/L
Allergen, Oak,t7: <0.1 kU/L
Allergen, P. notatum, m1: <0.1 kU/L
Aspergillus fumigatus, m3: <0.1 kU/L
BERMUDA GRASS: 0.3 kU/L — AB
Box Elder IgE: 0.51 kU/L — ABNORMAL HIGH
Cat Dander: <0.1 kU/L
Cockroach: <0.1 kU/L
Common Ragweed: 0.2 kU/L — ABNORMAL HIGH
D. farinae: 1.83 kU/L — ABNORMAL HIGH
Dog Dander: <0.1 kU/L
Elm IgE: 0.48 kU/L — ABNORMAL HIGH
IGE (IMMUNOGLOBULIN E), SERUM: 284 kU/L — AB (ref <115)
Johnson Grass: 1.52 kU/L — ABNORMAL HIGH
PECAN/HICKORY TREE IGE: 4.6 kU/L — AB
Rough Pigweed  IgE: 0.26 kU/L — ABNORMAL HIGH
Sheep Sorrel IgE: 0.12 kU/L — ABNORMAL HIGH
TIMOTHY GRASS: 5.14 kU/L — AB

## 2016-04-06 NOTE — Telephone Encounter (Signed)
Notes Recorded by Tanda Rockers, MD on 04/05/2016 at 5:34 PM EST Call pt: Reviewed cxr and no acute change so no change in recommendations made at ov ------------------------------------- Spoke with pt's wife, Barnetta Chapel. She is aware of results. Nothing further was needed.

## 2016-04-06 NOTE — Assessment & Plan Note (Signed)
DgEs   01/23/16 Unremarkable esophagram. Allergy profile 04/05/2016 >  Eos 0.4 /  IgE pending   The most common causes of chronic cough in immunocompetent adults include the following: upper airway cough syndrome (UACS), previously referred to as postnasal drip syndrome (PNDS), which is caused by variety of rhinosinus conditions; (2) asthma; (3) GERD; (4) chronic bronchitis from cigarette smoking or other inhaled environmental irritants; (5) nonasthmatic eosinophilic bronchitis; and (6) bronchiectasis.   These conditions, singly or in combination, have accounted for up to 94% of the causes of chronic cough in prospective studies.   Other conditions have constituted no >6% of the causes in prospective studies These have included bronchogenic carcinoma, chronic interstitial pneumonia, sarcoidosis, left ventricular failure, ACEI-induced cough, and aspiration from a condition associated with pharyngeal dysfunction.    Chronic cough is often simultaneously caused by more than one condition. A single cause has been found from 38 to 82% of the time, multiple causes from 18 to 62%. Multiply caused cough has been the result of three diseases up to 42% of the time.   Most likely this is Upper airway cough syndrome (previously labeled PNDS) , is  so named because it's frequently impossible to sort out how much is  CR/sinusitis with freq throat clearing (which can be related to primary GERD)   vs  causing  secondary (" extra esophageal")  GERD from wide swings in gastric pressure that occur with throat clearing, often  promoting self use of mint and menthol lozenges that reduce the lower esophageal sphincter tone and exacerbate the problem further in a cyclical fashion.   These are the same pts (now being labeled as having "irritable larynx syndrome" by some cough centers) who not infrequently have a history of having failed to tolerate ace inhibitors,  dry powder inhalers or biphosphonates or report having  atypical/extraesophageal reflux symptoms that don't respond to standard doses of PPI  and are easily confused as having aecopd or asthma flares by even experienced allergists/ pulmonologists (myself included).   rec max rx for GERD/ w/u for underlying allergy with next studies sinus ct/ MCT if not improved and will need to return with all meds in hand using a trust but verify approach to confirm accurate Medication  Reconciliation The principal here is that until we are certain that the  patients are doing what we've asked, it makes no sense to ask them to do more.    The standardized cough guidelines published in Chest by Lissa Morales in 2006 are still the best available and consist of a multiple step process (up to 12!) , not a single office visit,  and are intended  to address this problem logically,  with an alogrithm dependent on response to empiric treatment at  each progressive step  to determine a specific diagnosis with  minimal addtional testing needed. Therefore if adherence is an issue or can't be accurately verified,  it's very unlikely the standard evaluation and treatment will be successful here.    Furthermore, response to therapy (other than acute cough suppression, which should only be used short term with avoidance of narcotic containing cough syrups if possible), can be a gradual process for which the patient is not likely to  perceive immediate benefit.  Unlike going to an eye doctor where the best perscription is almost always the first one and is immediately effective, this is almost never the case in the management of chronic cough syndromes. Therefore the patient needs to commit up front to  consistently adhere to recommendations  for up to 6 weeks of therapy directed at the likely underlying problem(s) before the response can be reasonably evaluated.    I had an extended discussion with the patient reviewing all relevant studies completed to date and  lasting 25 minutes of a 40   minute office visit re new/ refractory non-specific but potentially very serious refractory respiratory symptoms of unknown etiology.  Each maintenance medication was reviewed in detail including most importantly the difference between maintenance and prns and under what circumstances the prns are to be triggered using an action plan format that is not reflected in the computer generated alphabetically organized AVS.    Please see AVS for specific instructions unique to this office visit that I personally wrote and verbalized to the the pt in detail and then reviewed with pt  by my nurse highlighting any changes in therapy/plan of care  recommended at today's visit.

## 2016-04-07 NOTE — Progress Notes (Signed)
lmtcb

## 2016-04-12 ENCOUNTER — Other Ambulatory Visit: Payer: Self-pay | Admitting: Internal Medicine

## 2016-04-12 DIAGNOSIS — R05 Cough: Secondary | ICD-10-CM

## 2016-04-12 DIAGNOSIS — R059 Cough, unspecified: Secondary | ICD-10-CM

## 2016-04-12 NOTE — Progress Notes (Signed)
Spoke with pt's spouse and notified of results per Dr. Wert. She verbalized understanding and denied any questions. 

## 2016-04-16 DIAGNOSIS — L812 Freckles: Secondary | ICD-10-CM | POA: Diagnosis not present

## 2016-04-16 DIAGNOSIS — L853 Xerosis cutis: Secondary | ICD-10-CM | POA: Diagnosis not present

## 2016-04-16 DIAGNOSIS — Z85828 Personal history of other malignant neoplasm of skin: Secondary | ICD-10-CM | POA: Diagnosis not present

## 2016-04-16 DIAGNOSIS — D1801 Hemangioma of skin and subcutaneous tissue: Secondary | ICD-10-CM | POA: Diagnosis not present

## 2016-04-16 DIAGNOSIS — L821 Other seborrheic keratosis: Secondary | ICD-10-CM | POA: Diagnosis not present

## 2016-04-16 DIAGNOSIS — D485 Neoplasm of uncertain behavior of skin: Secondary | ICD-10-CM | POA: Diagnosis not present

## 2016-05-19 ENCOUNTER — Encounter: Payer: Self-pay | Admitting: Diagnostic Neuroimaging

## 2016-05-19 ENCOUNTER — Ambulatory Visit: Payer: Self-pay | Admitting: Allergy and Immunology

## 2016-05-19 ENCOUNTER — Encounter (INDEPENDENT_AMBULATORY_CARE_PROVIDER_SITE_OTHER): Payer: Self-pay

## 2016-05-19 ENCOUNTER — Ambulatory Visit (INDEPENDENT_AMBULATORY_CARE_PROVIDER_SITE_OTHER): Payer: Medicare Other | Admitting: Diagnostic Neuroimaging

## 2016-05-19 VITALS — BP 149/74 | HR 85 | Ht 66.0 in | Wt 149.0 lb

## 2016-05-19 DIAGNOSIS — R413 Other amnesia: Secondary | ICD-10-CM

## 2016-05-19 DIAGNOSIS — R799 Abnormal finding of blood chemistry, unspecified: Secondary | ICD-10-CM | POA: Diagnosis not present

## 2016-05-19 DIAGNOSIS — F32A Depression, unspecified: Secondary | ICD-10-CM

## 2016-05-19 DIAGNOSIS — F988 Other specified behavioral and emotional disorders with onset usually occurring in childhood and adolescence: Secondary | ICD-10-CM

## 2016-05-19 DIAGNOSIS — F329 Major depressive disorder, single episode, unspecified: Secondary | ICD-10-CM

## 2016-05-19 DIAGNOSIS — R7309 Other abnormal glucose: Secondary | ICD-10-CM | POA: Diagnosis not present

## 2016-05-19 NOTE — Progress Notes (Signed)
[facility]  PATIENT: [name] Browning DOB: Jun 21, 1940  REFERRING CLINICIAN: Layla Barter  HISTORY FROM: patient and wife  REASON FOR VISIT: new consult    HISTORICAL  CHIEF COMPLAINT:  Chief Complaint  Patient presents with  . NP KAUR  Rule/Out Dementia    pt feels better since off vivactil and decrease of ativan to only 1 tab qhs.     HISTORY OF PRESENT ILLNESS:   76 year old male with history of depression, sleep apnea, atrial fibrillation, here for evaluation of memory loss.  Patient has long history of ADD and depression. He has been on multiple medications over many years. He stopped his ADD medications one year ago due to heart problems. Around the same time he was having increasing problems with short-term memory loss and forgetfulness. This has been noticed by his spouse, friends and work Medical laboratory scientific officer.   February 2017 patient was noted to having changing speech pattern, slurred speech, abnormal gait, forgetting appointments and dates. Patient also having more problems with short-term memory and driving directions.    REVIEW OF SYSTEMS: Full 14 system review of systems performed and negative with exception of: Decreased taste and smell, fatigue, confusion, depression, decreased energy. Patient had sleep study in 1990s and uses dental appliance.   ALLERGIES: No Known Allergies  HOME MEDICATIONS: Outpatient Medications Prior to Visit  Medication Sig Dispense Refill  . Multiple Vitamin tablet Take 1 tablet by mouth daily.    . ranitidine (ZANTAC) 150 MG tablet Take 150 mg by mouth at bedtime.     [name] RAPAFLO 8 MG CAPS capsule Take 8 mg by mouth daily with breakfast.     . traZODone (DESYREL) 50 MG tablet Take 100 mg by mouth at bedtime.     [name] LORazepam (ATIVAN) 1 MG tablet as directed. Take one tablet (1 mg) by mouth once daily as needed    . pantoprazole (PROTONIX) 40 MG tablet Take 1 tablet (40 mg total) by mouth daily. Take 30-60 min before first meal of  the day 30 tablet 2  . protriptyline (VIVACTIL) 10 MG tablet Take 1 tablet by mouth 3 (three) times daily.     Facility-Administered Medications Prior to Visit  Medication Dose Route Frequency Provider Last Rate Last Dose  . 0.9 %  sodium chloride infusion  500 mL Intravenous Continuous Milus Banister, MD      . 0.9 %  sodium chloride infusion  500 mL Intravenous Continuous Milus Banister, MD        PAST MEDICAL HISTORY: Past Medical History:  Diagnosis Date  . ADD (attention deficit disorder)   . Anxiety   . Asthma    AS A CHILD  . Atrial flutter ([place])    a. s/p RFCA 07/2011.  Xarelto d/c 08/2011.  . Cataracts, bilateral   . Conjunctivitis 09/14/2013  . Depression   . DJD (degenerative joint disease) of knee   . Dysrhythmia    hx a-flutter with successful ablation 2013  . GERD (gastroesophageal reflux disease)   . Heart murmur    a. 06/2011 Echo: 65-70%, mild MR  . Hyperlipidemia   . S/P cardiac cath    a. in 1990's - Dr. Gwenlyn Found - WNL  . Sleep apnea    could not tolerate cpap, uses mouth guard now    PAST SURGICAL HISTORY: Past Surgical History:  Procedure Laterality Date  . ATRIAL FLUTTER ABLATION N/A 08/06/2011   Procedure: ATRIAL FLUTTER ABLATION;  Surgeon: Deboraha Sprang, MD;  Location:  [facility];  Service: Cardiovascular;  Laterality: N/A;  . COLONOSCOPY    . LESION REMOVAL N/A 08/02/2014   Procedure: EXCISION UPPER LIP VASCULAR ANOMALY ;  Surgeon: Irene Limbo, MD;  Location: [facility];  Service: Plastics;  Laterality: N/A;  . MASTOIDECTOMY    . POLYPECTOMY    . TONSILLECTOMY    . WRIST FRACTURE SURGERY  2007   left    FAMILY HISTORY: Family History  Problem Relation Age of Onset  . Heart disease Mother   . Leukemia Mother   . Prostate cancer Father   . Stroke Father   . Hypertension Brother   . Prostate cancer Brother   . Colon cancer Neg Hx   . Stomach cancer Neg Hx   . Heart attack Neg Hx     SOCIAL HISTORY:  Social History     Social History  . Marital status: Married    Spouse name: N/A  . Number of children: N/A  . Years of education: N/A   Occupational History  . RetiredWater quality scientist for [employer]    Social History Main Topics  . Smoking status: Former Smoker    Types: Cigarettes    Quit date: 02/16/1968  . Smokeless tobacco: Never Used  . Alcohol use No  . Drug use: No  . Sexual activity: Not on file   Other Topics Concern  . Not on file   Social History Narrative  . No narrative on file     PHYSICAL EXAM  GENERAL EXAM/CONSTITUTIONAL: Vitals:  Vitals:   05/19/16 1433  BP: (!) 149/74  Pulse: 85  Weight: 149 lb (67.6 kg)  Height: 5\' 6"  (1.676 m)     Body mass index is 24.05 kg/m.  No exam data present  Patient is in no distress; well developed, nourished and groomed; neck is supple  CARDIOVASCULAR:  Examination of carotid arteries is normal; no carotid bruits  Regular rate and rhythm, no murmurs  Examination of peripheral vascular system by observation and palpation is normal  EYES:  Ophthalmoscopic exam of optic discs and posterior segments is normal; no papilledema or hemorrhages  MUSCULOSKELETAL:  Gait, strength, tone, movements noted in Neurologic exam below  NEUROLOGIC: MENTAL STATUS:  MMSE - Mini Mental State Exam 05/19/2016  Orientation to time 5  Orientation to Place 5  Registration 3  Attention/ Calculation 5  Recall 2  Language- name 2 objects 2  Language- repeat 1  Language- follow 3 step command 3  Language- read & follow direction 1  Write a sentence 1  Copy design 1  Total score 29    awake, alert, oriented to person, place and time  recent and remote memory intact  normal attention and concentration  language fluent, comprehension intact, naming intact,   fund of knowledge appropriate  CRANIAL NERVE:   2nd - no papilledema on fundoscopic exam  2nd, 3rd, 4th, 6th - pupils equal and reactive to light, visual fields full to  confrontation, extraocular muscles intact, no nystagmus  5th - facial sensation symmetric  7th - facial strength symmetric  8th - hearing intact  9th - palate elevates symmetrically, uvula midline  11th - shoulder shrug symmetric  12th - tongue protrusion midline  MOTOR:   normal bulk and tone, full strength in the BUE, BLE  SENSORY:   normal and symmetric to light touch, temperature, vibration  COORDINATION:   finger-nose-finger, fine finger movements normal  REFLEXES:   deep tendon reflexes present and symmetric  GAIT/STATION:   narrow based gait; SLIGHT DIFF WITH TANDEM; romberg is negative    DIAGNOSTIC DATA (LABS, IMAGING, TESTING) - I reviewed patient records, labs, notes, testing and imaging myself where available.  Lab Results  Component Value Date   WBC 6.2 04/05/2016   HGB 13.1 04/05/2016   HCT 38.0 (L) 04/05/2016   MCV 90.9 04/05/2016   PLT 321.0 04/05/2016      Component Value Date/Time   NA 140 08/14/2015 1053   K 4.4 08/14/2015 1053   CL 104 08/14/2015 1053   CO2 30 08/14/2015 1053   GLUCOSE 105 (H) 08/14/2015 1053   BUN 19 08/14/2015 1053   CREATININE 1.27 08/14/2015 1053   CALCIUM 9.7 08/14/2015 1053   PROT 7.7 08/14/2015 1053   ALBUMIN 4.6 08/14/2015 1053   AST 15 08/14/2015 1053   ALT 16 08/14/2015 1053   ALKPHOS 76 08/14/2015 1053   BILITOT 0.6 08/14/2015 1053   GFRNONAA 70.63 09/15/2009 0914   GFRAA 96 04/15/2008 0909   Lab Results  Component Value Date   CHOL 280 (H) 08/14/2015   HDL 50.60 08/14/2015   LDLCALC 207 (H) 08/14/2015   TRIG 112.0 08/14/2015   CHOLHDL 6 08/14/2015   No results found for: HGBA1C Lab Results  Component Value Date   VITAMINB12 458 01/24/2008   Lab Results  Component Value Date   TSH 1.79 08/14/2015       ASSESSMENT AND PLAN  76 y.o. year old male here with Worsening short-term memory problems, driving directions, change in speech and gait since every 2017, soon after stopping ADD  medications.   Ddx: mild memory loss due to dementia vs ADD vs pseudo-dementia (depression)  1. Memory loss   2. Attention deficit disorder, unspecified hyperactivity presence   3. Depression, unspecified depression type       PLAN: - check MRI brain - labs testing - neuropsychology testing - follow up with PCP re: LDL > 200 and BP  Orders Placed This Encounter  Procedures  . MR BRAIN WO CONTRAST  . Vitamin B12  . TSH  . Hemoglobin A1c  . Ambulatory referral to Neuropsychology   Return in about 3 months (around 08/18/2016).    Penni Bombard, MD 5/0/0938, 1:82 PM Certified in Neurology, Neurophysiology and Neuroimaging  [facility] [address], [place], [place] [address] [phone]

## 2016-05-19 NOTE — Patient Instructions (Addendum)
Thank you for coming to see [place] at [facility]. I hope we have been able to provide you high quality care today.  You may receive a patient satisfaction survey over the next few weeks. We would appreciate your feedback and comments so that we may continue to improve ourselves and the health of our patients.  - check MRI brain  - labs testing  - neuropsychology testing  - follow up with PCP re: cholesterol LDL > 200 and blood pressure  - visit [email or web address] website     ~~~~~~~~~~~~~~~~~~~~~~~~~~~~~~~~~~~~~~~~~~~~~~~~~~~~~~~~~~~~~~~~~  DR. PENUMALLI'S GUIDE TO HAPPY AND HEALTHY LIVING These are some of my general health and wellness recommendations. Some of them may apply to you better than others. Please use common sense as you try these suggestions and feel free to ask me any questions.   ACTIVITY/FITNESS Mental, social, emotional and physical stimulation are very important for brain and body health. Try learning a new activity (arts, music, language, sports, games).  Keep moving your body to the best of your abilities. You can do this at home, inside or outside, the park, community center, gym or anywhere you like. Consider a physical therapist or personal trainer to get started. Consider the app Sworkit. Fitness trackers such as smart-watches, smart-phones or Fitbits can help as well.   NUTRITION Eat more plants: colorful vegetables, nuts, seeds and berries.  Eat less sugar, salt, preservatives and processed foods.  Avoid toxins such as cigarettes and alcohol.  Drink water when you are thirsty. Warm water with a slice of lemon is an excellent morning drink to start the day.  Consider these websites for more information The Nutrition Source (https://www.henry-hernandez.biz/) Precision Nutrition (WindowBlog.ch)   RELAXATION Consider practicing mindfulness meditation or other relaxation techniques such as deep breathing,  prayer, yoga, tai chi, massage. See website mindful.org or the apps Headspace or Calm to help get started.   SLEEP Try to get at least 7-8+ hours sleep per day. Regular exercise and reduced caffeine will help you sleep better. Practice good sleep hygeine techniques. See website sleep.org for more information.   PLANNING Prepare estate planning, living will, healthcare POA documents. Sometimes this is best planned with the help of an attorney. Theconversationproject.org and agingwithdignity.org are excellent resources.

## 2016-05-20 LAB — TSH: TSH: 2.6 u[IU]/mL (ref 0.450–4.500)

## 2016-05-20 LAB — HEMOGLOBIN A1C
Est. average glucose Bld gHb Est-mCnc: 120 mg/dL
Hgb A1c MFr Bld: 5.8 % — ABNORMAL HIGH (ref 4.8–5.6)

## 2016-05-20 LAB — VITAMIN B12: VITAMIN B 12: 1549 pg/mL — AB (ref 232–1245)

## 2016-05-21 ENCOUNTER — Encounter: Payer: Self-pay | Admitting: Psychology

## 2016-05-24 ENCOUNTER — Telehealth: Payer: Self-pay | Admitting: *Deleted

## 2016-05-24 NOTE — Telephone Encounter (Signed)
-----   Message from Penni Bombard, MD sent at 05/21/2016  1:02 PM EDT ----- Unremarkable labs except borderline A1c. Continue current plan. Please call patient. -VRP

## 2016-05-24 NOTE — Telephone Encounter (Signed)
Spoke to wife and gave her the results of the labs.  Unremarkable except for borderline HBG A1C.  Continue current plan.  Pt exercises 3 times a week, and has heart healthy diet per wife.  Would f/u with pcp re: HbgA1c.

## 2016-06-01 ENCOUNTER — Ambulatory Visit
Admission: RE | Admit: 2016-06-01 | Discharge: 2016-06-01 | Disposition: A | Discharge status: Home / Self Care | Payer: Medicare Other | Source: Ambulatory Visit | Attending: Diagnostic Neuroimaging | Admitting: Diagnostic Neuroimaging

## 2016-06-01 DIAGNOSIS — R413 Other amnesia: Secondary | ICD-10-CM

## 2016-06-03 ENCOUNTER — Telehealth: Payer: Self-pay | Admitting: *Deleted

## 2016-06-03 NOTE — Telephone Encounter (Signed)
-----   Message from Penni Bombard, MD sent at 06/03/2016  1:53 PM EDT ----- Some brain atrophy and spots of scar tissue noted. No other major findings. Please call patient. Continue current plan. -VRP

## 2016-06-03 NOTE — Telephone Encounter (Signed)
Spoke to pts wife and relayed the message per Dr. Leta Baptist, that his MRI brain did show some brain atrophy and spots of scar tissue.  No major findings and to continue current plan.  She verbalized understanding would relay to her husband.    She also stated Dr. Alain Marion will see pt as new pcp on Monday.

## 2016-06-07 ENCOUNTER — Ambulatory Visit (INDEPENDENT_AMBULATORY_CARE_PROVIDER_SITE_OTHER): Payer: Medicare Other | Admitting: Internal Medicine

## 2016-06-07 ENCOUNTER — Encounter: Payer: Self-pay | Admitting: Internal Medicine

## 2016-06-07 ENCOUNTER — Other Ambulatory Visit (INDEPENDENT_AMBULATORY_CARE_PROVIDER_SITE_OTHER): Payer: Medicare Other

## 2016-06-07 VITALS — BP 148/82 | HR 79 | Temp 98.4°F | Ht 66.0 in | Wt 145.0 lb

## 2016-06-07 DIAGNOSIS — R270 Ataxia, unspecified: Secondary | ICD-10-CM | POA: Diagnosis not present

## 2016-06-07 DIAGNOSIS — F329 Major depressive disorder, single episode, unspecified: Secondary | ICD-10-CM

## 2016-06-07 DIAGNOSIS — E785 Hyperlipidemia, unspecified: Secondary | ICD-10-CM

## 2016-06-07 DIAGNOSIS — I483 Typical atrial flutter: Secondary | ICD-10-CM

## 2016-06-07 DIAGNOSIS — F32A Depression, unspecified: Secondary | ICD-10-CM

## 2016-06-07 DIAGNOSIS — E559 Vitamin D deficiency, unspecified: Secondary | ICD-10-CM

## 2016-06-07 DIAGNOSIS — F1996 Other psychoactive substance use, unspecified with psychoactive substance-induced persisting amnestic disorder: Secondary | ICD-10-CM | POA: Diagnosis not present

## 2016-06-07 DIAGNOSIS — Z23 Encounter for immunization: Secondary | ICD-10-CM | POA: Diagnosis not present

## 2016-06-07 HISTORY — DX: Ataxia, unspecified: R27.0

## 2016-06-07 LAB — SEDIMENTATION RATE: Sed Rate: 14 mm/hr (ref 0–20)

## 2016-06-07 LAB — CBC WITH DIFFERENTIAL/PLATELET
Basophils Absolute: 0 10*3/uL (ref 0.0–0.1)
Basophils Relative: 0.4 % (ref 0.0–3.0)
EOS PCT: 3.9 % (ref 0.0–5.0)
Eosinophils Absolute: 0.2 10*3/uL (ref 0.0–0.7)
HEMATOCRIT: 39.3 % (ref 39.0–52.0)
Hemoglobin: 13.3 g/dL (ref 13.0–17.0)
LYMPHS ABS: 1.6 10*3/uL (ref 0.7–4.0)
LYMPHS PCT: 26.5 % (ref 12.0–46.0)
MCHC: 33.9 g/dL (ref 30.0–36.0)
MCV: 92.6 fl (ref 78.0–100.0)
MONOS PCT: 10.1 % (ref 3.0–12.0)
Monocytes Absolute: 0.6 10*3/uL (ref 0.1–1.0)
NEUTROS ABS: 3.6 10*3/uL (ref 1.4–7.7)
NEUTROS PCT: 59.1 % (ref 43.0–77.0)
PLATELETS: 330 10*3/uL (ref 150.0–400.0)
RBC: 4.24 Mil/uL (ref 4.22–5.81)
RDW: 13 % (ref 11.5–15.5)
WBC: 6.1 10*3/uL (ref 4.0–10.5)

## 2016-06-07 LAB — BASIC METABOLIC PANEL
BUN: 16 mg/dL (ref 6–23)
CHLORIDE: 103 meq/L (ref 96–112)
CO2: 29 meq/L (ref 19–32)
CREATININE: 1.24 mg/dL (ref 0.40–1.50)
Calcium: 10 mg/dL (ref 8.4–10.5)
GFR: 60.36 mL/min (ref >60.00)
Glucose, Bld: 112 mg/dL — ABNORMAL HIGH (ref 70–99)
Potassium: 4.3 mEq/L (ref 3.5–5.1)
Sodium: 140 mEq/L (ref 135–145)

## 2016-06-07 LAB — HEPATIC FUNCTION PANEL
ALT: 33 U/L (ref 0–53)
AST: 26 U/L (ref 0–37)
Albumin: 4.5 g/dL (ref 3.5–5.2)
Alkaline Phosphatase: 73 U/L (ref 39–117)
BILIRUBIN DIRECT: 0.1 mg/dL (ref 0.0–0.3)
BILIRUBIN TOTAL: 0.5 mg/dL (ref 0.2–1.2)
Total Protein: 7.5 g/dL (ref 6.0–8.3)

## 2016-06-07 LAB — URINALYSIS
BILIRUBIN URINE: NEGATIVE
Hgb urine dipstick: NEGATIVE
Ketones, ur: NEGATIVE
LEUKOCYTES UA: NEGATIVE
NITRITE: NEGATIVE
PH: 6 (ref 5.0–8.0)
SPECIFIC GRAVITY, URINE: 1.02 (ref 1.000–1.030)
TOTAL PROTEIN, URINE-UPE24: NEGATIVE
Urine Glucose: NEGATIVE
Urobilinogen, UA: 0.2 (ref 0.0–1.0)

## 2016-06-07 LAB — TSH: TSH: 2.2 u[IU]/mL (ref 0.35–4.50)

## 2016-06-07 MED ORDER — ZOSTER VAC RECOMB ADJUVANTED 50 MCG/0.5ML IM SUSR: 0.5000 mL | Freq: Once | INTRAMUSCULAR | 1 refills | Status: AC

## 2016-06-07 NOTE — Assessment & Plan Note (Signed)
No relapse 

## 2016-06-07 NOTE — Addendum Note (Signed)
Addended by: Cassandria Anger on: 06/07/2016 11:29 AM   Modules accepted: Orders

## 2016-06-07 NOTE — Assessment & Plan Note (Signed)
Neuro-psych testing f/u w/Dr Tish Frederickson

## 2016-06-07 NOTE — Addendum Note (Signed)
Addended by: Karren Cobble on: 06/07/2016 11:40 AM   Modules accepted: Orders

## 2016-06-07 NOTE — Assessment & Plan Note (Signed)
Try Tai-chi

## 2016-06-07 NOTE — Progress Notes (Signed)
Pre visit review using our clinic review tool, if applicable. No additional management support is needed unless otherwise documented below in the visit note. 

## 2016-06-07 NOTE — Assessment & Plan Note (Signed)
On Trazodone 

## 2016-06-07 NOTE — Progress Notes (Signed)
Subjective:  Patient ID: [name] Browning, male    DOB: 04-29-1940  Age: 76 y.o. MRN: [id number]  CC: No chief complaint on file.   HPI ISAIAH CIANCI presents for confusion episodes, insomnia, BPH f/u - new pt to est PC. He was seeing Dr Melvyn Novas  Outpatient Medications Prior to Visit  Medication Sig Dispense Refill  . LORazepam (ATIVAN) 1 MG tablet Take 1 mg by mouth at bedtime.    . Multiple Vitamin tablet Take 1 tablet by mouth daily.    . ranitidine (ZANTAC) 150 MG tablet Take 150 mg by mouth at bedtime.     [name] RAPAFLO 8 MG CAPS capsule Take 8 mg by mouth daily with breakfast.     . traZODone (DESYREL) 50 MG tablet Take 100 mg by mouth at bedtime.     . vitamin B-12 (CYANOCOBALAMIN) 1000 MCG tablet Take 1,000 mcg by mouth daily.     Facility-Administered Medications Prior to Visit  Medication Dose Route Frequency Provider Last Rate Last Dose  . 0.9 %  sodium chloride infusion  500 mL Intravenous Continuous Milus Banister, MD      . 0.9 %  sodium chloride infusion  500 mL Intravenous Continuous Milus Banister, MD        ROS Review of Systems  Constitutional: Negative for appetite change, fatigue and unexpected weight change.  HENT: Negative for congestion, nosebleeds, sneezing, sore throat and trouble swallowing.   Eyes: Negative for itching and visual disturbance.  Respiratory: Negative for cough.   Cardiovascular: Negative for chest pain, palpitations and leg swelling.  Gastrointestinal: Negative for abdominal distention, blood in stool, diarrhea and nausea.  Genitourinary: Negative for frequency and hematuria.  Musculoskeletal: Positive for gait problem. Negative for back pain, joint swelling and neck pain.  Skin: Negative for rash.  Neurological: Negative for dizziness, tremors, speech difficulty and weakness.  Psychiatric/Behavioral: Negative for agitation, dysphoric mood, sleep disturbance and suicidal ideas. The patient is not nervous/anxious.     Objective:  BP  (!) 148/82 (BP Location: Left Arm, Patient Position: Sitting, Cuff Size: Normal)   Pulse 79   Temp 98.4 F (36.9 C) (Oral)   Ht 5\' 6"  (1.676 m)   Wt 145 lb 0.6 oz (65.8 kg)   SpO2 99%   BMI 23.41 kg/m   BP Readings from Last 3 Encounters:  06/07/16 (!) 148/82  05/19/16 (!) 149/74  04/05/16 130/84    Wt Readings from Last 3 Encounters:  06/07/16 145 lb 0.6 oz (65.8 kg)  05/19/16 149 lb (67.6 kg)  04/05/16 150 lb 12.8 oz (68.4 kg)    Physical Exam  Constitutional: He is oriented to person, place, and time. He appears well-developed. No distress.  NAD  HENT:  Mouth/Throat: Oropharynx is clear and moist.  Eyes: Conjunctivae are normal. Pupils are equal, round, and reactive to light.  Neck: Normal range of motion. No JVD present. No thyromegaly present.  Cardiovascular: Normal rate, regular rhythm, normal heart sounds and intact distal pulses.  Exam reveals no gallop and no friction rub.   No murmur heard. Pulmonary/Chest: Effort normal and breath sounds normal. No respiratory distress. He has no wheezes. He has no rales. He exhibits no tenderness.  Abdominal: Soft. Bowel sounds are normal. He exhibits no distension and no mass. There is no tenderness. There is no rebound and no guarding.  Musculoskeletal: Normal range of motion. He exhibits no edema or tenderness.  Lymphadenopathy:    He has no cervical adenopathy.  Neurological:  He is alert and oriented to person, place, and time. He has normal reflexes. No cranial nerve deficit. He exhibits normal muscle tone. He displays a negative Romberg sign. Coordination and gait normal.  Skin: Skin is warm and dry. No rash noted.  Psychiatric: He has a normal mood and affect. His behavior is normal. Judgment and thought content normal.    Lab Results  Component Value Date   WBC 6.2 04/05/2016   HGB 13.1 04/05/2016   HCT 38.0 (L) 04/05/2016   PLT 321.0 04/05/2016   GLUCOSE 105 (H) 08/14/2015   CHOL 280 (H) 08/14/2015   TRIG 112.0  08/14/2015   HDL 50.60 08/14/2015   LDLCALC 207 (H) 08/14/2015   ALT 16 08/14/2015   AST 15 08/14/2015   NA 140 08/14/2015   K 4.4 08/14/2015   CL 104 08/14/2015   CREATININE 1.27 08/14/2015   BUN 19 08/14/2015   CO2 30 08/14/2015   TSH 2.600 05/19/2016   PSA 0.49 04/15/2008   HGBA1C 5.8 (H) 05/19/2016    Mr Brain Wo Contrast  Result Date: 06/02/2016 [facility] NEUROIMAGING REPORT STUDY DATE: 06/01/16 PATIENT NAME: GEREMIAH FUSSELL DOB: [birth year] MRN: [id number] ORDERING CLINICIAN: Andrey Spearman, MD CLINICAL HISTORY: 76 year old male with memory loss. EXAM: MRI brain (without) TECHNIQUE: MRI of the brain without contrast was obtained utilizing 5 mm axial slices with T1, T2, T2 flair, SWI and diffusion weighted views.  T1 sagittal and T2 coronal views were obtained. CONTRAST: no COMPARISON: none IMAGING SITE: [facility] [address] (1.5 Tesla MRI)  FINDINGS: No abnormal lesions are seen on diffusion-weighted views to suggest acute ischemia. The cortical sulci, fissures and cisterns are notable for mild diffuse and moderate mesial temporal atrophy. Lateral, third and fourth ventricle are normal in size and appearance. No extra-axial fluid collections are seen. No evidence of mass effect or midline shift.  Scattered subcortical, periventricular chronic small vessel ischemic disease. On sagittal views the posterior fossa, pituitary gland and corpus callosum are unremarkable. No evidence of intracranial hemorrhage on SWI views. The orbits and their contents, paranasal sinuses and calvarium are notable for bilateral lens extractions and mild mucosal thickening in the frontal, maxillary, ethmoid and sphenoid sinuses.  Intracranial flow voids are present.   Abnormal MRI brain (without) demonstrating: 1. Mild diffuse and moderate mesial temporal atrophy. 2. Scattered subcortical, periventricular chronic small vessel ischemic disease. 3. No acute findings.  INTERPRETING PHYSICIAN: Penni Bombard, MD Certified in Neurology, Neurophysiology and Neuroimaging [facility] [address], [place],  [address] [phone]    Assessment & Plan:   There are no diagnoses linked to this encounter. I am having Mr. Antrobus maintain his traZODone, RAPAFLO, Multiple Vitamin, ranitidine, LORazepam, and vitamin B-12. We will continue to administer sodium chloride and sodium chloride.  No orders of the defined types were placed in this encounter.    Follow-up: No Follow-up on file.  Walker Kehr, MD

## 2016-06-09 ENCOUNTER — Telehealth: Payer: Self-pay | Admitting: Internal Medicine

## 2016-06-09 NOTE — Telephone Encounter (Signed)
Pt wife called in and said that she has some questions about pt lab work.  Can you call her when you get a chance?

## 2016-06-10 NOTE — Telephone Encounter (Signed)
Spoke with patients wife and patient will have to return to have vit d and lipid

## 2016-06-10 NOTE — Addendum Note (Signed)
Addended by: Karren Cobble on: 06/10/2016 10:47 AM   Modules accepted: Orders

## 2016-06-11 ENCOUNTER — Other Ambulatory Visit (INDEPENDENT_AMBULATORY_CARE_PROVIDER_SITE_OTHER): Payer: Medicare Other

## 2016-06-11 DIAGNOSIS — E559 Vitamin D deficiency, unspecified: Secondary | ICD-10-CM | POA: Diagnosis not present

## 2016-06-11 DIAGNOSIS — E785 Hyperlipidemia, unspecified: Secondary | ICD-10-CM | POA: Diagnosis not present

## 2016-06-11 LAB — VITAMIN D 25 HYDROXY (VIT D DEFICIENCY, FRACTURES): VITD: 34.5 ng/mL (ref 30.00–100.00)

## 2016-06-11 LAB — LIPID PANEL
CHOLESTEROL: 233 mg/dL — AB (ref 0–200)
HDL: 57.9 mg/dL (ref >39.00)
LDL CALC: 143 mg/dL — AB (ref 0–99)
NONHDL: 174.64
Total CHOL/HDL Ratio: 4
Triglycerides: 157 mg/dL — ABNORMAL HIGH (ref 0.0–149.0)
VLDL: 31.4 mg/dL (ref 0.0–40.0)

## 2016-06-17 ENCOUNTER — Telehealth: Payer: Self-pay | Admitting: Internal Medicine

## 2016-06-17 NOTE — Telephone Encounter (Signed)
Pt wife called she is confused on lab results. She would like to know the MG of Vitamin D to take.  One lab says Vit D is ok, one says double Vit intake.

## 2016-06-17 NOTE — Telephone Encounter (Signed)
Pts wife informed about lab results and told to have pt start 2000IU of vit d

## 2016-06-23 DIAGNOSIS — H6122 Impacted cerumen, left ear: Secondary | ICD-10-CM | POA: Diagnosis not present

## 2016-06-29 ENCOUNTER — Ambulatory Visit (INDEPENDENT_AMBULATORY_CARE_PROVIDER_SITE_OTHER): Payer: Medicare Other | Admitting: Psychology

## 2016-06-29 DIAGNOSIS — R413 Other amnesia: Secondary | ICD-10-CM | POA: Diagnosis not present

## 2016-06-29 DIAGNOSIS — F329 Major depressive disorder, single episode, unspecified: Secondary | ICD-10-CM

## 2016-06-29 DIAGNOSIS — F32A Depression, unspecified: Secondary | ICD-10-CM

## 2016-06-29 DIAGNOSIS — F988 Other specified behavioral and emotional disorders with onset usually occurring in childhood and adolescence: Secondary | ICD-10-CM

## 2016-06-29 NOTE — Progress Notes (Signed)
NEUROPSYCHOLOGICAL INTERVIEW (CPT: [id number])  Name: [name] Browning Date of Birth: [birth year] Date of Interview: 06/29/2016  Reason for Referral:  [name] Browning is a 76 y.o. male who is referred for neuropsychological evaluation by Dr. Andrey Spearman of [facility] due to concerns about memory loss. This patient is accompanied in the office by his wife who supplements the history. His wife also brings a letter written by the patient's close friend and former colleague, describing cognitive changes she has observed.  History of Presenting Problem:  [name] Browning was seen for neurologic evaluation of memory concerns by Dr. Leta Baptist on 05/19/2016. MMSE was 29/30. The patient has a history of depression and ADHD. He is followed by psychiatrist Dr. Toy Care. Labs completed on 05/19/2016 revealed borderline Hgb A1c (5.8). He does exercise three times a week and follows a heart healthy diet. MRI of the brain completed on 06/01/2016 reportedly revealed mild diffuse and moderate mesial temporal atrophy as well as scattered subcortical, periventricular chronic small vessel ischemic disease. No acute abnormalities were found.  Per the patient and his wife, he began demonstrating speech and gait changes around February 2017. He was also having short term memory problems and some difficulty with driving directions. Additionally, he had very chapped lips and dry mouth around the same time. His friends became concerned by cognitive decline they noticed, and actually approached his wife to say that he may have Alzheimer's disease. The patient and his wife state that he had been taking some new medications at the time including Vivactil for depression. They state he was on "three different pills three times a day" and once the medications and/or dosages were changed, his dry mouth went away and his gait returned to normal. He also reportedly demonstrated improvement in cognitive functioning. At some  point after that, he was put on omeprazole, and this seems to make him very confused so it was stopped immediately. His wife noted that this had also happened a few years ago when he had started a statin (got very confused but improved once it was stopped).  At the present time, the patient and his wife feel he has returned to his cognitive baseline.   As noted previously, he does have a history of probable ADHD. He has always had trouble with attention lapses, disorganization and misplacing things. He states he did very poorly in school and relates a history of severe test anxiety. He has been very anxious about this neuropsychological evaluation. He is anxious to know whether he has Alzheimer's. He was on Ritalin for a long time and then switched to Adderall which he was also on a long time. His blood pressure increased so the Adderall was stopped, about 1 1/2 to 2 years ago.  He has had sleep apnea for a long time, but he couldn't tolerate the CPAP so he uses a dental device. Once a year, his dentist does an evaluation to make sure it is still working for him.  He also has a history of insomnia, and he has been on trazadone and lorazepam for long time for this. He has a history of vivid dreams and nightmares. He sometimes will call out in his sleep but he does not talk in his sleep or act out dreams. He feels he gets enough sleep (with his medications).  Medical history is also significant for "very low testosterone, almost zero". He was on Androgel for a long time but feels better off it (experienced negative side effects). He does  get tired very easily and doesn't have a lot of energy.  He denied any current problems with walking or balance. He denied dizziness or lightheadedness. He has not been falling.  He denied history of head injury or concussion. He denied history of hallucinations or other psychosis.  He does have a history of alcohol abuse. He would binge drink on Saturdays and Sundays  but did not drink at all during the week. It never interfered with his work or other aspects of his life. He stopped drinking alcohol at age 31.  Family history is significant for memory loss in his father in his mid 72s and stroke in his father at age 8. Family history is also significant for depression in his mother, but she never sought treatment for this.   Current Functioning: The patient lives with his wife in their own home. He independently manages all complex ADLs, including driving, daily medication administration, personal finances, appointments without any difficulty. His wife started filling his pillbox just to be safe when he was having more cognitive problems. She also does most of the cooking but he can do it if he needs to.  He describes his current mood as "pretty good". He feels he is managing his depression okay. He has never had very deep depression or psychiatric hospitalization. He has never participated in counseling. He denied suicidal ideation or intention.  He does experience anxiety characterized by worrying.    Social History: Born/Raised: [place] Education: Doctor, hospital - bachelor's degree. [employer] - completed a 1 year program in the journalism school.  Occupational history: He served in the [employer] for 6 years. 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 He had a very successful career.  Marital history: married 51 years. No children. Alcohol/Tobacco/Substances: No alcohol (quit at age 85). Former smoking (quit at age 79). No history of illicit substance use.    Medical History: Past Medical History:  Diagnosis Date  . ADD (attention deficit disorder)   . Anxiety   . Asthma    AS A CHILD  . Atrial flutter ([place])    a. s/p RFCA 07/2011.  Xarelto d/c 08/2011.  . Cataracts, bilateral   . Conjunctivitis 09/14/2013  . Depression   . DJD (degenerative joint disease) of knee   . Dysrhythmia    hx a-flutter with successful  ablation 2013  . GERD (gastroesophageal reflux disease)   . Heart murmur    a. 06/2011 Echo: 65-70%, mild MR  . Hyperlipidemia   . S/P cardiac cath    a. in 1990's - Dr. Gwenlyn Found - WNL  . Sleep apnea    could not tolerate cpap, uses mouth guard now      Current Medications:  Outpatient Encounter Prescriptions as of 06/29/2016  Medication Sig  . LORazepam (ATIVAN) 1 MG tablet Take 1 mg by mouth at bedtime.  . Multiple Vitamin tablet Take 1 tablet by mouth daily.  . ranitidine (ZANTAC) 150 MG tablet Take 150 mg by mouth at bedtime.   [name] RAPAFLO 8 MG CAPS capsule Take 8 mg by mouth daily with breakfast.   . traZODone (DESYREL) 50 MG tablet Take 100 mg by mouth at bedtime.    Facility-Administered Encounter Medications as of 06/29/2016  Medication  . 0.9 %  sodium chloride infusion  . 0.9 %  sodium chloride infusion     Behavioral Observations:   Appearance: Neatly and appropriately dressed and groomed Gait: Ambulated independently, no abnormalities observed Speech: Fluent;  normal rate, rhythm and volume Thought process: Linear, goal directed Affect: Full, euthymic but anxious Interpersonal: Very pleasant, appropriate   TESTING: There is medical necessity to proceed with neuropsychological assessment as the results will be used to aid in differential diagnosis and clinical decision-making and to inform specific treatment recommendations. Per the patient, his wife and medical records reviewed, there has been a change in cognitive functioning and a reasonable suspicion of cognitive impairment due to medication effects versus underlying degenerative dementia. Following the clinical interview, the patient completed a full battery of neuropsychological testing with my psychometrician under my supervision.   PLAN: The patient will return for a follow-up session with me at which time his test performances and my impressions and treatment recommendations will be reviewed in detail.   Full  neuropsychological evaluation report to follow.

## 2016-06-29 NOTE — Progress Notes (Signed)
   Neuropsychology Note  [name] Browning came in today for 1 hour of neuropsychological testing with technician, Milana Kidney, BS, under the supervision of Dr. Macarthur Critchley. The patient did not appear overtly distressed by the testing session, per behavioral observation or via self-report to the technician. Rest breaks were offered. [name] will return within 2 weeks for a feedback session with Dr. Si Raider at which time his test performances, clinical impressions and treatment recommendations will be reviewed in detail. The patient understands he can contact our office should he require our assistance before this time.  Full report to follow.

## 2016-06-30 ENCOUNTER — Encounter: Payer: Self-pay | Admitting: Psychology

## 2016-07-18 NOTE — Progress Notes (Signed)
NEUROPSYCHOLOGICAL EVALUATION   Name:    [name] Browning  Date of Birth:   January 28, 1941 Date of Interview:  06/29/2016 Date of Testing:  06/29/2016   Date of Feedback:  07/20/2016       Background Information:  Reason for Referral:  [name] Browning is a 76 y.o. male referred by Dr. Andrey Spearman of [facility] to assess his current level of cognitive functioning and assist in differential diagnosis. The current evaluation consisted of a review of available medical records, an interview with the patient and his wife, and the completion of a neuropsychological testing battery. His wife also brings a letter written by the patient's close friend and former colleague, describing cognitive changes she has observed. Informed consent was obtained.  History of Presenting Problem:  [name] Browning was seen for neurologic evaluation of memory concerns by Dr. Leta Baptist on 05/19/2016. MMSE was 29/30. The patient has a history of depression and ADHD. He is followed by psychiatrist Dr. Toy Care. Labs completed on 05/19/2016 revealed borderline Hgb A1c (5.8). He does exercise three times a week and follows a heart healthy diet. MRI of the brain completed on 06/01/2016 reportedly revealed mild diffuse and moderate mesial temporal atrophy as well as scattered subcortical, periventricular chronic small vessel ischemic disease. No acute abnormalities were found.  Per the patient and his wife, he began demonstrating speech and gait changes around February 2017. He was also having short term memory problems and some difficulty with driving directions. Additionally, he had very chapped lips and dry mouth around the same time. His friends became concerned by cognitive decline they noticed, and actually approached his wife to say that he may have Alzheimer's disease. The patient and his wife state that he had been taking some new medications at the time including Vivactil for depression. They state he was on  "three different pills three times a day" and once the medications and/or dosages were changed, his dry mouth went away and his gait returned to normal. He also reportedly demonstrated improvement in cognitive functioning. At some point after that, he was put on omeprazole, and this seems to make him very confused so it was stopped immediately. His wife noted that this had also happened a few years ago when he had started a statin (got very confused but improved once it was stopped).  At the present time, the patient and his wife feel he has returned to his cognitive baseline.   As noted previously, he does have a history of probable ADHD. He has always had trouble with attention lapses, disorganization and misplacing things. He states he did very poorly in school and relates a history of severe test anxiety. He has been very anxious about this neuropsychological evaluation. He is anxious to know whether he has Alzheimer's. He was on Ritalin for a long time and then switched to Adderall which he was also on a long time. His blood pressure increased so the Adderall was stopped, about 1 1/2 to 2 years ago.  He has had sleep apnea for a long time, but he couldn't tolerate the CPAP so he uses a dental device. Once a year, his dentist does an evaluation to make sure it is still working for him.  He also has a history of insomnia, and he has been on trazadone and lorazepam for long time for this. He has a history of vivid dreams and nightmares. He sometimes will call out in his sleep but he does not talk in  his sleep or act out dreams. He feels he gets enough sleep (with his medications).  Medical history is also significant for "very low testosterone, almost zero". He was on Androgel for a long time but feels better off it (experienced negative side effects). He does get tired very easily and doesn't have a lot of energy.  He denied any current problems with walking or balance. He denied dizziness or  lightheadedness. He has not been falling.  He denied history of head injury or concussion. He denied history of hallucinations or other psychosis.  He does have a history of alcohol abuse. He would binge drink on Saturdays and Sundays but did not drink at all during the week. It never interfered with his work or other aspects of his life. He stopped drinking alcohol at age 67.  Family history is significant for memory loss in his father in his mid 32s and stroke in his father at age 21. Family history is also significant for depression in his mother, but she never sought treatment for this.   Current Functioning: The patient lives with his wife in their own home. He independently manages all complex ADLs, including driving, daily medication administration, personal finances, appointments without any difficulty. His wife started filling his pillbox just to be safe when he was having more cognitive problems. She also does most of the cooking but he can do it if he needs to.  He describes his current mood as "pretty good". He feels he is managing his depression okay. He has never had very deep depression or psychiatric hospitalization. He has never participated in counseling. He denied suicidal ideation or intention.  He does experience anxiety characterized by worrying.    Social History: Born[place]Raised[place] Education: Doctor, hospital - bachelor's degree. [employer] - completed a 1 year program in the journalism school.  Occupational history: He served in the [employer] for 6 years. 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 He had a very successful career.  Marital history: married 19 years. No children. Alcohol/Tobacco/Substances: No alcohol (quit at age 28). Former smoking (quit at age 85). No history of illicit substance use.    Medical History:  Past Medical History:  Diagnosis Date  . ADD (attention deficit disorder)   . Anxiety   . Asthma    AS  A CHILD  . Atrial flutter ([place])    a. s/p RFCA 07/2011.  Xarelto d/c 08/2011.  . Cataracts, bilateral   . Conjunctivitis 09/14/2013  . Depression   . DJD (degenerative joint disease) of knee   . Dysrhythmia    hx a-flutter with successful ablation 2013  . GERD (gastroesophageal reflux disease)   . Heart murmur    a. 06/2011 Echo: 65-70%, mild MR  . Hyperlipidemia   . S/P cardiac cath    a. in 1990's - Dr. Gwenlyn Found - WNL  . Sleep apnea    could not tolerate cpap, uses mouth guard now    Current medications:  Outpatient Encounter Prescriptions as of 07/20/2016  Medication Sig  . LORazepam (ATIVAN) 1 MG tablet Take 1 mg by mouth at bedtime.  . Multiple Vitamin tablet Take 1 tablet by mouth daily.  . ranitidine (ZANTAC) 150 MG tablet Take 150 mg by mouth at bedtime.   [name] RAPAFLO 8 MG CAPS capsule Take 8 mg by mouth daily with breakfast.   . traZODone (DESYREL) 50 MG tablet Take 100 mg by mouth at bedtime.    Facility-Administered Encounter  Medications as of 07/20/2016  Medication  . 0.9 %  sodium chloride infusion  . 0.9 %  sodium chloride infusion     Current Examination:  Behavioral Observations:   Appearance: Neatly and appropriately dressed and groomed Gait: Ambulated independently, no abnormalities observed Speech: Fluent; normal rate, rhythm and volume Thought process: Linear, goal directed Affect: Full, euthymic but anxious Interpersonal: Very pleasant, appropriate Orientation: Oriented to all spheres. Accurately named the current President and his predecessor.   Tests Administered: . Test of Premorbid Functioning (TOPF) . Wechsler Adult Intelligence Scale-Fourth Edition (WAIS-IV): Similarities, Music therapist, Coding and Digit Span subtests . Wechsler Memory Scale-Fourth Edition (WMS-IV) Older Adult Version (ages 32-90): Logical Memory I, II and Recognition subtests  . [employer] Verbal Learning Test - 2nd Edition (CVLT-2) Short Form . Repeatable Battery for the Assessment of  Neuropsychological Status (RBANS) Form A:  Figure Copy and Recall subtests and Semantic Fluency subtest . Neuropsychological Assessment Battery (NAB) Language Module, Form 1: Naming subtest . Boston Diagnostic Aphasia Examination: Complex Ideational Material subtest . Controlled Oral Word Association Test (COWAT) . Trail Making Test A and B . Clock drawing test . Geriatric Depression Scale (GDS) 15 Item . Generalized Anxiety Disorder - 7 item screener (GAD-7)  Test Results: Note: Standardized scores are presented only for use by appropriately trained professionals and to allow for any future test-retest comparison. These scores should not be interpreted without consideration of all the information that is contained in the rest of the report. The most recent standardization samples from the test publisher or other sources were used whenever possible to derive standard scores; scores were corrected for age, gender, ethnicity and education when available.   Test Scores:  Test Name Raw Score Standardized Score Descriptor  TOPF 52/70 SS= 110 High average  WAIS-IV Subtests     Similarities 19/36 ss= 8 Average  Block Design 20/66 ss= 8 Average  Coding 37/135 ss= 8 Average  Digit Span Forward 7/16 ss= 7 Low average  Digit Span Backward 7/16 ss= 9 Average  WMS-IV Subtests     LM I 31/53 ss= 10 Average  LM II 19/39 ss= 11 Average  LM II Recognition 18/23 Cum %: 26-50 WNL  RBANS Subtests     Figure Copy 18/20 Z= 0.1 Average  Figure Recall 8/20 Z= -1.1 Low average  Semantic Fluency 14/40 Z= -1.1 Low average  CVLT-II Scores     Trial 1 3/9 Z= -2.5 Impaired  Trial 4 7/9 Z= 0 Average  Trials 1-4 total 25/36 T= 53 Average  SD Free Recall 6/9 Z= -0.5 Average  LD Free Recall 4/9 Z= -0.5 Average  LD Cued Recall 7/9 Z= 1 High average  Recognition Discriminability 8/9 hits, 0 false positives Z= 1 High average  Forced Choice Recognition 9/9  WNL  NAB Language subtests     Naming 27/31 T= 33  Borderline  BDAE Subtest     Complex Ideational Material 11/12  Below expectation  COWAT-FAS 29 T= 39 Low average  COWAT-Animals 16 T= 45 Average  Trail Making Test A  49" 0 errors T= 49 Average  Trail Making Test B  157" 1 error T= 43 Average  Clock Drawing   WNL  GDS-15 6/15  Mild  GAD-7 9/21  Mild      Description of Test Results:  Premorbid verbal intellectual abilities were estimated to have been within the high average range based on a test of word reading. Psychomotor processing speed was average. Auditory attention and working memory  were low average to average, respectively. Visual-spatial construction was average. Language abilities were variable. Specifically, confrontation naming was borderline impaired, while semantic verbal fluency was low average to average. Auditory comprehension of complex ideational material was slightly below expectation. With regard to verbal memory, encoding and acquisition of non-contextual information (i.e., word list) was average. After a brief distracter task, free recall was average. After a delay, free recall was average. Cued recall was high average. Performance on a yes/no recognition task was high average. On another verbal memory test, encoding and acquisition of contextual auditory information (i.e., short stories) was average. After a delay, free recall was average. Performance on a yes/no recognition task was average. With regard to non-verbal memory, delayed free recall of visual information was low average. Executive functioning was within normal limits overall. Mental flexibility and set-shifting were average on Trails B. Verbal fluency with phonemic search restrictions was low average. Verbal abstract reasoning was average. Performance on a clock drawing task was within normal limits. On a self-report measure of mood, the patient's responses were indicative of mild depression at the present time. On a self-report measure of anxiety, the  patient endorsed a mild level of generalized anxiety at the present time.    Clinical Impressions: ADHD (by history); Mild depression, chronic; Mild cognitive impairment. Results of the current cognitive evaluation are largely within normal limits, with most areas of function consistent with estimated premorbid intellectual abilities. Specifically, psychomotor processing speed, auditory attention, visual-spatial skills, learning and memory, and executive functioning were all normal. Meanwhile, there was evidence of mildly reduced language functions, including on tests of confrontation naming, verbal fluency and complex auditory comprehension. His testing results do not warrant a diagnosis of dementia; however, a diagnosis mild cognitive impairment is appropriate at this time. He also has a history of ADHD and mild depression, and he is endorsing mild anxiety related to the current evaluation process.  In terms of etiology, it is too early to tell if his mild cognitive impairment represents an underlying neurodegenerative disorder. It is reassuring that his memory consolidation and retrieval abilities are strengths on testing, as these are usually areas of initial impairment in Alzheimer's disease. Based on these testing results and his current level of functioning, he does NOT meet diagnostic criteria for a dementia syndrome.    Recommendations: Based on the findings of the present evaluation, the following recommendations are offered:  --Optimal control of vascular risk factors is necessary to reduce the risk of further cognitive decline. He is also encouraged to continue wearing the dental device for sleep apnea, and to maintain regular cardiovascular exercise as well as follow dietary recommendations of his providers. He should continue to engage in mentally stimulating and socially interactive activities.  --Monitor mood to ensure that depression does not worsen or interfere with functioning, now  that he is no longer taking antidepressants.  --He can follow up with Dr. Leta Baptist about [facility] clinical trials if he is interested.  --Neuropsychological re-assessment in one year is recommended in order to monitor cognitive status, track any progression of symptoms and further assist with treatment planning.   Feedback to Patient: [name] Browning and his wife returned for a feedback appointment on 07/19/2016 to review the results of his neuropsychological evaluation with this provider. 20 minutes face-to-face time was spent reviewing his test results, my impressions and my recommendations as detailed above.    Total time spent on this patient's case: [id number] unit for interview with psychologist; [phone] units of testing by psychometrician  under psychologist's supervision; [phone] units for medical record review, scoring of neuropsychological tests, interpretation of test results, preparation of this report, and review of results to the patient by psychologist.      Thank you for your referral of [name] Browning. Please feel free to contact me if you have any questions or concerns regarding this report.

## 2016-07-20 ENCOUNTER — Ambulatory Visit (INDEPENDENT_AMBULATORY_CARE_PROVIDER_SITE_OTHER): Payer: Medicare Other | Admitting: Psychology

## 2016-07-20 ENCOUNTER — Encounter: Payer: Self-pay | Admitting: Psychology

## 2016-07-20 DIAGNOSIS — R413 Other amnesia: Secondary | ICD-10-CM

## 2016-07-20 DIAGNOSIS — G3184 Mild cognitive impairment, so stated: Secondary | ICD-10-CM

## 2016-07-20 NOTE — Patient Instructions (Addendum)
Fortunately, test results were NOT indicative of dementia. Most areas of cognitive functioning tested were completely normal (including psychomotor processing speed, auditory attention, visual-spatial skills, learning and memory, and executive functioning). You did have some mild difficulty on tests of language, including naming of items, generating words in a certain category, and comprehending complex auditory information. These were mild and it is too early to tell what may be causing this. At this time, we use the term "mild cognitive impairment" to explain the findings, and we recommend retesting in a year to make sure there hasn't been any change. I believe there is also a clinical study going on at Dr. Gladstone Lighter office for patients with mild cognitive impairment; you can follow up with him for more information on this.  In order to maintain brain health and reduce the risk of cognitive decline: You should continue to maintain optimal control of blood pressure and blood sugars.  You should engage in regular cardiovascular exercise. You should continue to engage in activities that provide mental stimulation and social interaction. You should continue treatment for chronic depression.

## 2016-08-11 ENCOUNTER — Ambulatory Visit (INDEPENDENT_AMBULATORY_CARE_PROVIDER_SITE_OTHER): Payer: Medicare Other | Admitting: Internal Medicine

## 2016-08-11 ENCOUNTER — Encounter: Payer: Self-pay | Admitting: Internal Medicine

## 2016-08-11 DIAGNOSIS — J209 Acute bronchitis, unspecified: Secondary | ICD-10-CM

## 2016-08-11 HISTORY — DX: Acute bronchitis, unspecified: J20.9

## 2016-08-11 MED ORDER — PROMETHAZINE-CODEINE 6.25-10 MG/5ML PO SYRP: 5.0000 mL | ORAL_SOLUTION | ORAL | 0 refills | Status: DC | PRN

## 2016-08-11 MED ORDER — AZITHROMYCIN 250 MG PO TABS: ORAL_TABLET | ORAL | 0 refills | Status: DC

## 2016-08-11 MED ORDER — BENZONATATE 200 MG PO CAPS: 200.0000 mg | ORAL_CAPSULE | Freq: Three times a day (TID) | ORAL | 0 refills | Status: DC | PRN

## 2016-08-11 NOTE — Progress Notes (Signed)
Subjective:  Patient ID: [name] Browning, male    DOB: May 07, 1940  Age: 76 y.o. MRN: [id number]  CC: No chief complaint on file.   HPI ABDALLAH HERN presents for cough x 3 weeks. No CP. H/o asthma as a child...  Outpatient Medications Prior to Visit  Medication Sig Dispense Refill  . LORazepam (ATIVAN) 1 MG tablet Take 1 mg by mouth at bedtime.    . Multiple Vitamin tablet Take 1 tablet by mouth daily.    . ranitidine (ZANTAC) 150 MG tablet Take 150 mg by mouth at bedtime.     [name] RAPAFLO 8 MG CAPS capsule Take 8 mg by mouth daily with breakfast.     . traZODone (DESYREL) 50 MG tablet Take 100 mg by mouth at bedtime.      Facility-Administered Medications Prior to Visit  Medication Dose Route Frequency Provider Last Rate Last Dose  . 0.9 %  sodium chloride infusion  500 mL Intravenous Continuous Milus Banister, MD      . 0.9 %  sodium chloride infusion  500 mL Intravenous Continuous Milus Banister, MD        ROS Review of Systems  Constitutional: Negative for appetite change, fatigue and unexpected weight change.  HENT: Negative for congestion, nosebleeds, sneezing, sore throat and trouble swallowing.   Eyes: Negative for itching and visual disturbance.  Respiratory: Positive for cough.   Cardiovascular: Negative for chest pain, palpitations and leg swelling.  Gastrointestinal: Negative for abdominal distention, blood in stool, diarrhea and nausea.  Genitourinary: Negative for frequency and hematuria.  Musculoskeletal: Negative for back pain, gait problem, joint swelling and neck pain.  Skin: Negative for rash.  Neurological: Negative for dizziness, tremors, speech difficulty and weakness.  Psychiatric/Behavioral: Negative for agitation, dysphoric mood and sleep disturbance. The patient is not nervous/anxious.     Objective:  BP 120/76 (BP Location: Left Arm, Patient Position: Sitting, Cuff Size: Normal)   Pulse 64   Temp 98.9 F (37.2 C) (Oral)   Ht 5\' 6"  (1.676  m)   Wt 151 lb (68.5 kg)   SpO2 98%   BMI 24.37 kg/m   BP Readings from Last 3 Encounters:  08/11/16 120/76  06/07/16 (!) 148/82  05/19/16 (!) 149/74    Wt Readings from Last 3 Encounters:  08/11/16 151 lb (68.5 kg)  06/07/16 145 lb 0.6 oz (65.8 kg)  05/19/16 149 lb (67.6 kg)    Physical Exam  Constitutional: He is oriented to person, place, and time. He appears well-developed. No distress.  NAD  HENT:  Mouth/Throat: Oropharynx is clear and moist.  Eyes: Conjunctivae are normal. Pupils are equal, round, and reactive to light.  Neck: Normal range of motion. No JVD present. No thyromegaly present.  Cardiovascular: Normal rate, regular rhythm, normal heart sounds and intact distal pulses.  Exam reveals no gallop and no friction rub.   No murmur heard. Pulmonary/Chest: Effort normal and breath sounds normal. No respiratory distress. He has no wheezes. He has no rales. He exhibits no tenderness.  Abdominal: Soft. Bowel sounds are normal. He exhibits no distension and no mass. There is no tenderness. There is no rebound and no guarding.  Musculoskeletal: Normal range of motion. He exhibits no edema or tenderness.  Lymphadenopathy:    He has no cervical adenopathy.  Neurological: He is alert and oriented to person, place, and time. He has normal reflexes. No cranial nerve deficit. He exhibits normal muscle tone. He displays a negative Romberg sign. Coordination  and gait normal.  Skin: Skin is warm and dry. No rash noted.  Psychiatric: He has a normal mood and affect. His behavior is normal. Judgment and thought content normal.    Lab Results  Component Value Date   WBC 6.1 06/07/2016   HGB 13.3 06/07/2016   HCT 39.3 06/07/2016   PLT 330.0 06/07/2016   GLUCOSE 112 (H) 06/07/2016   CHOL 233 (H) 06/11/2016   TRIG 157.0 (H) 06/11/2016   HDL 57.90 06/11/2016   LDLCALC 143 (H) 06/11/2016   ALT 33 06/07/2016   AST 26 06/07/2016   NA 140 06/07/2016   K 4.3 06/07/2016   CL 103  06/07/2016   CREATININE 1.24 06/07/2016   BUN 16 06/07/2016   CO2 29 06/07/2016   TSH 2.20 06/07/2016   PSA 0.49 04/15/2008   HGBA1C 5.8 (H) 05/19/2016    Mr Brain Wo Contrast  Result Date: 06/02/2016 [facility] NEUROIMAGING REPORT STUDY DATE: 06/01/16 PATIENT NAME: [name] Browning DOB: Nov 18, 1940 MRN: [id number] ORDERING CLINICIAN: Andrey Spearman, MD CLINICAL HISTORY: 75 year old male with memory loss. EXAM: MRI brain (without) TECHNIQUE: MRI of the brain without contrast was obtained utilizing 5 mm axial slices with T1, T2, T2 flair, SWI and diffusion weighted views.  T1 sagittal and T2 coronal views were obtained. CONTRAST: no COMPARISON: none IMAGING SITE: [facility] [address] (1.5 Tesla MRI)  FINDINGS: No abnormal lesions are seen on diffusion-weighted views to suggest acute ischemia. The cortical sulci, fissures and cisterns are notable for mild diffuse and moderate mesial temporal atrophy. Lateral, third and fourth ventricle are normal in size and appearance. No extra-axial fluid collections are seen. No evidence of mass effect or midline shift.  Scattered subcortical, periventricular chronic small vessel ischemic disease. On sagittal views the posterior fossa, pituitary gland and corpus callosum are unremarkable. No evidence of intracranial hemorrhage on SWI views. The orbits and their contents, paranasal sinuses and calvarium are notable for bilateral lens extractions and mild mucosal thickening in the frontal, maxillary, ethmoid and sphenoid sinuses.  Intracranial flow voids are present.   Abnormal MRI brain (without) demonstrating: 1. Mild diffuse and moderate mesial temporal atrophy. 2. Scattered subcortical, periventricular chronic small vessel ischemic disease. 3. No acute findings. INTERPRETING PHYSICIAN: Penni Bombard, MD Certified in Neurology, Neurophysiology and Neuroimaging [facility] [address] [place]  [place], [place] [address] [phone]    Assessment & Plan:   There are no diagnoses linked to this encounter. I am having Mr. Marty maintain his traZODone, RAPAFLO, Multiple Vitamin, ranitidine, and LORazepam. We will continue to administer sodium chloride and sodium chloride.  No orders of the defined types were placed in this encounter.    Follow-up: No Follow-up on file.  Walker Kehr, MD

## 2016-08-11 NOTE — Assessment & Plan Note (Signed)
H/o asthma as a child.Marland KitchenMarland Kitchen

## 2016-08-11 NOTE — Patient Instructions (Signed)
You can use over-the-counter  "cold" medicines  such as  " Delsym" or" Robitussin" cough syrup varietis for cough.  You can use plain "Tylenol" or "Advil" for fever, chills and achyness. Use Halls or Ricola cough drops.     Please, make an appointment if you are not better or if you're worse.  

## 2016-08-16 ENCOUNTER — Ambulatory Visit: Payer: Medicare Other | Admitting: Internal Medicine

## 2016-09-06 ENCOUNTER — Encounter: Payer: Self-pay | Admitting: Diagnostic Neuroimaging

## 2016-09-06 ENCOUNTER — Ambulatory Visit (INDEPENDENT_AMBULATORY_CARE_PROVIDER_SITE_OTHER): Payer: Medicare Other | Admitting: Diagnostic Neuroimaging

## 2016-09-06 VITALS — BP 136/72 | HR 63 | Ht 66.0 in | Wt 150.2 lb

## 2016-09-06 DIAGNOSIS — R413 Other amnesia: Secondary | ICD-10-CM | POA: Diagnosis not present

## 2016-09-06 NOTE — Patient Instructions (Signed)
MEMORY LOSS / MILD COGNITIVE IMPAIRMENT (now resolved) - monitor symptoms - brain healthy activities reviewed  - follow up with PCP re: lipids and

## 2016-09-06 NOTE — Progress Notes (Signed)
[facility]  PATIENT: [name] Browning DOB: 1940/07/04  REFERRING CLINICIAN: Layla Barter  HISTORY FROM: patient and wife  REASON FOR VISIT: follow up   HISTORICAL  CHIEF COMPLAINT:  Chief Complaint  Patient presents with  . Follow-up  . Memory Loss    36m RV  (has seen neuro-psychologist), ? research study candidate    HISTORY OF PRESENT ILLNESS:   UPDATE 09/06/16: Since last visit, doing well. No more major memory issues. In fact, patient and wife tell me that at last visit, memory issues had essentially resolved. Neuropsych testing completed and reviewed --> no evidence of dementia, but MCI is possible (vs depression and ADHD). Patient and wife went out of country to [place], and he was able to arrange all local travel and tour arrangements.  PRIOR HPI (05/19/16): 76 year old male with history of depression, sleep apnea, atrial fibrillation, here for evaluation of memory loss. Patient has long history of ADD and depression. He has been on multiple medications over many years. He stopped his ADD medications one year ago due to heart problems. Around the same time he was having increasing problems with short-term memory loss and forgetfulness. This has been noticed by his spouse, friends and work Medical laboratory scientific officer. February 2017 patient was noted to having changing speech pattern, slurred speech, abnormal gait, forgetting appointments and dates. Patient also having more problems with short-term memory and driving directions.    REVIEW OF SYSTEMS: Full 14 system review of systems performed and negative with exception of: cough; patient had sleep study in 1990s and uses dental appliance.   ALLERGIES: No Known Allergies  HOME MEDICATIONS: Outpatient Medications Prior to Visit  Medication Sig Dispense Refill  . LORazepam (ATIVAN) 1 MG tablet Take 1 mg by mouth at bedtime.    . Multiple Vitamin tablet Take 1 tablet by mouth daily.    . ranitidine (ZANTAC) 150 MG tablet Take  150 mg by mouth at bedtime.     [name] RAPAFLO 8 MG CAPS capsule Take 8 mg by mouth daily with breakfast.     . traZODone (DESYREL) 50 MG tablet Take 100 mg by mouth at bedtime.     . promethazine-codeine (PHENERGAN WITH CODEINE) 6.25-10 MG/5ML syrup Take 5 mLs by mouth every 4 (four) hours as needed. (Patient not taking: Reported on 09/06/2016) 300 mL 0  . azithromycin (ZITHROMAX Z-PAK) 250 MG tablet As directed (Patient not taking: Reported on 09/06/2016) 6 tablet 0  . benzonatate (TESSALON) 200 MG capsule Take 1 capsule (200 mg total) by mouth 3 (three) times daily as needed for cough. (Patient not taking: Reported on 09/06/2016) 60 capsule 0   Facility-Administered Medications Prior to Visit  Medication Dose Route Frequency Provider Last Rate Last Dose  . 0.9 %  sodium chloride infusion  500 mL Intravenous Continuous Milus Banister, MD      . 0.9 %  sodium chloride infusion  500 mL Intravenous Continuous Milus Banister, MD        PAST MEDICAL HISTORY: Past Medical History:  Diagnosis Date  . ADD (attention deficit disorder)   . Anxiety   . Asthma    AS A CHILD  . Atrial flutter ([place])    a. s/p RFCA 07/2011.  Xarelto d/c 08/2011.  . Cataracts, bilateral   . Conjunctivitis 09/14/2013  . Depression   . DJD (degenerative joint disease) of knee   . Dysrhythmia    hx a-flutter with successful ablation 2013  . GERD (gastroesophageal reflux disease)   .  Heart murmur    a. 06/2011 Echo: 65-70%, mild MR  . Hyperlipidemia   . S/P cardiac cath    a. in 1990's - Dr. Gwenlyn Found - WNL  . Sleep apnea    could not tolerate cpap, uses mouth guard now    PAST SURGICAL HISTORY: Past Surgical History:  Procedure Laterality Date  . ATRIAL FLUTTER ABLATION N/A 08/06/2011   Procedure: ATRIAL FLUTTER ABLATION;  Surgeon: Deboraha Sprang, MD;  Location: [facility] C[facility] LAB;  Service: Cardiovascular;  Laterality: N/A;  . COLONOSCOPY    . LESION REMOVAL N/A 08/02/2014   Procedure: EXCISION UPPER LIP VASCULAR ANOMALY ;   Surgeon: Irene Limbo, MD;  Location: [facility];  Service: Plastics;  Laterality: N/A;  . MASTOIDECTOMY    . POLYPECTOMY    . TONSILLECTOMY    . WRIST FRACTURE SURGERY  2007   left    FAMILY HISTORY: Family History  Problem Relation Age of Onset  . Heart disease Mother   . Leukemia Mother   . Prostate cancer Father   . Stroke Father   . Hypertension Brother   . Prostate cancer Brother   . Colon cancer Neg Hx   . Stomach cancer Neg Hx   . Heart attack Neg Hx     SOCIAL HISTORY:  Social History   Social History  . Marital status: Married    Spouse name: N/A  . Number of children: N/A  . Years of education: N/A   Occupational History  . RetiredWater quality scientist for [employer]    Social History Main Topics  . Smoking status: Former Smoker    Types: Cigarettes    Quit date: 02/16/1968  . Smokeless tobacco: Never Used  . Alcohol use No  . Drug use: No  . Sexual activity: Not on file   Other Topics Concern  . Not on file   Social History Narrative  . No narrative on file     PHYSICAL EXAM  GENERAL EXAM/CONSTITUTIONAL: Vitals:  Vitals:   09/06/16 1313  BP: 136/72  Pulse: 63  Weight: 150 lb 3.2 oz (68.1 kg)  Height: 5\' 6"  (1.676 m)   Body mass index is 24.24 kg/m. No exam data present  Patient is in no distress; well developed, nourished and groomed; neck is supple  CARDIOVASCULAR:  Examination of carotid arteries is normal; no carotid bruits  Regular rate and rhythm, no murmurs  Examination of peripheral vascular system by observation and palpation is normal  EYES:  Ophthalmoscopic exam of optic discs and posterior segments is normal; no papilledema or hemorrhages  MUSCULOSKELETAL:  Gait, strength, tone, movements noted in Neurologic exam below  NEUROLOGIC: MENTAL STATUS:  MMSE - Mini Mental State Exam 05/19/2016  Orientation to time 5  Orientation to Place 5  Registration 3  Attention/ Calculation 5  Recall 2  Language-  name 2 objects 2  Language- repeat 1  Language- follow 3 step command 3  Language- read & follow direction 1  Write a sentence 1  Copy design 1  Total score 29    awake, alert, oriented to person, place and time  recent and remote memory intact  normal attention and concentration  language fluent, comprehension intact, naming intact,   fund of knowledge appropriate  CRANIAL NERVE:   2nd - no papilledema on fundoscopic exam  2nd, 3rd, 4th, 6th - pupils equal and reactive to light, visual fields full to confrontation, extraocular muscles intact, no nystagmus  5th - facial sensation  symmetric  7th - facial strength symmetric  8th - hearing intact  9th - palate elevates symmetrically, uvula midline  11th - shoulder shrug symmetric  12th - tongue protrusion midline  MOTOR:   normal bulk and tone, full strength in the BUE, BLE  SENSORY:   normal and symmetric to light touch, temperature, vibration  COORDINATION:   finger-nose-finger, fine finger movements normal  REFLEXES:   deep tendon reflexes present and symmetric  GAIT/STATION:   narrow based gait    DIAGNOSTIC DATA (LABS, IMAGING, TESTING) - I reviewed patient records, labs, notes, testing and imaging myself where available.  Lab Results  Component Value Date   WBC 6.1 06/07/2016   HGB 13.3 06/07/2016   HCT 39.3 06/07/2016   MCV 92.6 06/07/2016   PLT 330.0 06/07/2016      Component Value Date/Time   NA 140 06/07/2016 1147   K 4.3 06/07/2016 1147   CL 103 06/07/2016 1147   CO2 29 06/07/2016 1147   GLUCOSE 112 (H) 06/07/2016 1147   BUN 16 06/07/2016 1147   CREATININE 1.24 06/07/2016 1147   CALCIUM 10.0 06/07/2016 1147   PROT 7.5 06/07/2016 1147   ALBUMIN 4.5 06/07/2016 1147   AST 26 06/07/2016 1147   ALT 33 06/07/2016 1147   ALKPHOS 73 06/07/2016 1147   BILITOT 0.5 06/07/2016 1147   GFRNONAA 70.63 09/15/2009 0914   GFRAA 96 04/15/2008 0909   Lab Results  Component Value Date    CHOL 233 (H) 06/11/2016   HDL 57.90 06/11/2016   LDLCALC 143 (H) 06/11/2016   TRIG 157.0 (H) 06/11/2016   CHOLHDL 4 06/11/2016   Lab Results  Component Value Date   HGBA1C 5.8 (H) 05/19/2016   Lab Results  Component Value Date   VITAMINB12 1,549 (H) 05/19/2016   Lab Results  Component Value Date   TSH 2.20 06/07/2016    06/01/16 MRI brain (without) [I reviewed images myself and agree with interpretation. -VRP] 1. Mild diffuse and moderate mesial temporal atrophy. 2. Scattered subcortical, periventricular chronic small vessel ischemic disease. 3. No acute findings.    ASSESSMENT AND PLAN  76 y.o. year old male here with Worsening short-term memory problems, driving directions, change in speech and gait since every 2017, soon after stopping ADD medications.   Ddx: mild memory loss due to dementia vs ADD vs pseudo-dementia (depression)  No diagnosis found.    PLAN:  I spent 15 minutes of face to face time with patient. Greater than 50% of time was spent in counseling and coordination of care with patient. In summary we discussed:    MEMORY LOSS / MILD COGNITIVE IMPAIRMENT (now resolved) - monitor symptoms - brain healthy activities reviewed  - follow up with PCP re: lipids and BP  Return if symptoms worsen or fail to improve, for return to PCP.    Penni Bombard, MD 10/10/4156, 3:09 PM Certified in Neurology, Neurophysiology and Neuroimaging  [facility] [address] [place], [place] [address] [phone]

## 2016-09-10 ENCOUNTER — Telehealth: Payer: Self-pay | Admitting: Internal Medicine

## 2016-09-10 NOTE — Telephone Encounter (Signed)
Patient has an appointment on Aug 2 at 1115. His wife states that will not work. They need a later one during the day. They are going out of town and do not want to see anyone else. They asked if we could see if Dr. Camila Li could work the patient in at a time that worked for them. Please advise. They are aware he is out of office untl Aug 1. Thank you.

## 2016-09-13 ENCOUNTER — Ambulatory Visit: Payer: Medicare Other | Admitting: Internal Medicine

## 2016-09-13 DIAGNOSIS — L550 Sunburn of first degree: Secondary | ICD-10-CM | POA: Diagnosis not present

## 2016-09-13 DIAGNOSIS — Z85828 Personal history of other malignant neoplasm of skin: Secondary | ICD-10-CM | POA: Diagnosis not present

## 2016-09-15 NOTE — Telephone Encounter (Signed)
?  4:15 Thx

## 2016-09-15 NOTE — Telephone Encounter (Signed)
Spoke with patients wife. Stated this would not work for them. They wanted to be able to come in on Tuesday. Stated they would just see him on his appointment Aug 20.

## 2016-09-16 ENCOUNTER — Ambulatory Visit: Payer: Medicare Other | Admitting: Internal Medicine

## 2016-10-04 ENCOUNTER — Ambulatory Visit (INDEPENDENT_AMBULATORY_CARE_PROVIDER_SITE_OTHER): Payer: Medicare Other | Admitting: Internal Medicine

## 2016-10-04 ENCOUNTER — Other Ambulatory Visit (INDEPENDENT_AMBULATORY_CARE_PROVIDER_SITE_OTHER): Payer: Medicare Other

## 2016-10-04 ENCOUNTER — Encounter: Payer: Self-pay | Admitting: Internal Medicine

## 2016-10-04 ENCOUNTER — Other Ambulatory Visit: Payer: Self-pay | Admitting: Internal Medicine

## 2016-10-04 VITALS — BP 134/82 | HR 70 | Temp 97.6°F | Ht 66.0 in | Wt 149.0 lb

## 2016-10-04 DIAGNOSIS — G479 Sleep disorder, unspecified: Secondary | ICD-10-CM

## 2016-10-04 DIAGNOSIS — D125 Benign neoplasm of sigmoid colon: Secondary | ICD-10-CM | POA: Diagnosis not present

## 2016-10-04 DIAGNOSIS — K635 Polyp of colon: Secondary | ICD-10-CM

## 2016-10-04 DIAGNOSIS — N320 Bladder-neck obstruction: Secondary | ICD-10-CM | POA: Diagnosis not present

## 2016-10-04 DIAGNOSIS — I483 Typical atrial flutter: Secondary | ICD-10-CM | POA: Diagnosis not present

## 2016-10-04 DIAGNOSIS — E785 Hyperlipidemia, unspecified: Secondary | ICD-10-CM

## 2016-10-04 DIAGNOSIS — F1996 Other psychoactive substance use, unspecified with psychoactive substance-induced persisting amnestic disorder: Secondary | ICD-10-CM | POA: Diagnosis not present

## 2016-10-04 DIAGNOSIS — Z8719 Personal history of other diseases of the digestive system: Secondary | ICD-10-CM | POA: Diagnosis not present

## 2016-10-04 DIAGNOSIS — I10 Essential (primary) hypertension: Secondary | ICD-10-CM

## 2016-10-04 HISTORY — DX: Bladder-neck obstruction: N32.0

## 2016-10-04 HISTORY — DX: Polyp of colon: K63.5

## 2016-10-04 LAB — LIPID PANEL
CHOLESTEROL: 263 mg/dL — AB (ref 0–200)
HDL: 54.2 mg/dL (ref >39.00)
LDL CALC: 176 mg/dL — AB (ref 0–99)
NonHDL: 208.75
Total CHOL/HDL Ratio: 5
Triglycerides: 165 mg/dL — ABNORMAL HIGH (ref 0.0–149.0)
VLDL: 33 mg/dL (ref 0.0–40.0)

## 2016-10-04 MED ORDER — VITAMIN D3 50 MCG (2000 UT) PO CAPS: 2000.0000 [IU] | ORAL_CAPSULE | Freq: Every day | ORAL | 3 refills | Status: DC

## 2016-10-04 MED ORDER — MEGARED OMEGA-3 KRILL OIL 500 MG PO CAPS: 1.0000 | ORAL_CAPSULE | Freq: Every morning | ORAL | 3 refills | Status: DC

## 2016-10-04 NOTE — Assessment & Plan Note (Signed)
Trazodone  Lorazepam prn 

## 2016-10-04 NOTE — Assessment & Plan Note (Addendum)
NAS diet ASA - unable to use due to h/o AVM bleeding - colon

## 2016-10-04 NOTE — Assessment & Plan Note (Signed)
Rapaflo

## 2016-10-04 NOTE — Assessment & Plan Note (Signed)
  Due colon 02/2019

## 2016-10-04 NOTE — Assessment & Plan Note (Signed)
Dr. Klein

## 2016-10-04 NOTE — Progress Notes (Signed)
Subjective:  Patient ID: [name] Browning, male    DOB: Apr 03, 1940  Age: 76 y.o. MRN: [id number]  CC: No chief complaint on file.   HPI [name] Browning presents for memory loss - resolved off meds F/u anxiety, cough Cough has resolved F/u polyps  Outpatient Medications Prior to Visit  Medication Sig Dispense Refill  . LORazepam (ATIVAN) 1 MG tablet Take 1 mg by mouth at bedtime.    . Multiple Vitamin tablet Take 1 tablet by mouth daily.    . ranitidine (ZANTAC) 150 MG tablet Take 150 mg by mouth at bedtime.     [name] RAPAFLO 8 MG CAPS capsule Take 8 mg by mouth daily with breakfast.     . traZODone (DESYREL) 50 MG tablet Take 100 mg by mouth at bedtime.     . promethazine-codeine (PHENERGAN WITH CODEINE) 6.25-10 MG/5ML syrup Take 5 mLs by mouth every 4 (four) hours as needed. (Patient not taking: Reported on 09/06/2016) 300 mL 0   Facility-Administered Medications Prior to Visit  Medication Dose Route Frequency Provider Last Rate Last Dose  . 0.9 %  sodium chloride infusion  500 mL Intravenous Continuous Milus Banister, MD      . 0.9 %  sodium chloride infusion  500 mL Intravenous Continuous Milus Banister, MD        ROS Review of Systems  Constitutional: Negative for appetite change, fatigue and unexpected weight change.  HENT: Negative for congestion, nosebleeds, sneezing, sore throat and trouble swallowing.   Eyes: Negative for itching and visual disturbance.  Respiratory: Negative for cough.   Cardiovascular: Negative for chest pain, palpitations and leg swelling.  Gastrointestinal: Negative for abdominal distention, blood in stool, diarrhea and nausea.  Genitourinary: Negative for frequency and hematuria.  Musculoskeletal: Negative for back pain, gait problem, joint swelling and neck pain.  Skin: Negative for rash.  Neurological: Negative for dizziness, tremors, speech difficulty and weakness.  Psychiatric/Behavioral: Negative for agitation, decreased concentration,  dysphoric mood, sleep disturbance and suicidal ideas. The patient is not nervous/anxious.     Objective:  BP 134/82 (BP Location: Right Arm, Patient Position: Sitting, Cuff Size: Normal)   Pulse 70   Temp 97.6 F (36.4 C) (Oral)   Ht 5\' 6"  (1.676 m)   Wt 149 lb (67.6 kg)   SpO2 98%   BMI 24.05 kg/m   BP Readings from Last 3 Encounters:  10/04/16 134/82  09/06/16 136/72  08/11/16 120/76    Wt Readings from Last 3 Encounters:  10/04/16 149 lb (67.6 kg)  09/06/16 150 lb 3.2 oz (68.1 kg)  08/11/16 151 lb (68.5 kg)    Physical Exam  Constitutional: He is oriented to person, place, and time. He appears well-developed. No distress.  NAD  HENT:  Mouth/Throat: Oropharynx is clear and moist.  Eyes: Pupils are equal, round, and reactive to light. Conjunctivae are normal.  Neck: Normal range of motion. No JVD present. No thyromegaly present.  Cardiovascular: Normal rate, regular rhythm, normal heart sounds and intact distal pulses.  Exam reveals no gallop and no friction rub.   No murmur heard. Pulmonary/Chest: Effort normal and breath sounds normal. No respiratory distress. He has no wheezes. He has no rales. He exhibits no tenderness.  Abdominal: Soft. Bowel sounds are normal. He exhibits no distension and no mass. There is no tenderness. There is no rebound and no guarding.  Musculoskeletal: Normal range of motion. He exhibits no edema or tenderness.  Lymphadenopathy:    He has no  cervical adenopathy.  Neurological: He is alert and oriented to person, place, and time. He has normal reflexes. No cranial nerve deficit. He exhibits normal muscle tone. He displays a negative Romberg sign. Coordination and gait normal.  Skin: Skin is warm and dry. No rash noted.  Psychiatric: He has a normal mood and affect. His behavior is normal. Judgment and thought content normal.    Lab Results  Component Value Date   WBC 6.1 06/07/2016   HGB 13.3 06/07/2016   HCT 39.3 06/07/2016   PLT 330.0  06/07/2016   GLUCOSE 112 (H) 06/07/2016   CHOL 233 (H) 06/11/2016   TRIG 157.0 (H) 06/11/2016   HDL 57.90 06/11/2016   LDLCALC 143 (H) 06/11/2016   ALT 33 06/07/2016   AST 26 06/07/2016   NA 140 06/07/2016   K 4.3 06/07/2016   CL 103 06/07/2016   CREATININE 1.24 06/07/2016   BUN 16 06/07/2016   CO2 29 06/07/2016   TSH 2.20 06/07/2016   PSA 0.49 04/15/2008   HGBA1C 5.8 (H) 05/19/2016    Mr Brain Wo Contrast  Result Date: 06/02/2016 [facility] NEUROIMAGING REPORT STUDY DATE: 06/01/16 PATIENT NAME: [name] Browning DOB: 22-Sep-1940 MRN: [id number] ORDERING CLINICIAN: Andrey Spearman, MD CLINICAL HISTORY: 76 year old male with memory loss. EXAM: MRI brain (without) TECHNIQUE: MRI of the brain without contrast was obtained utilizing 5 mm axial slices with T1, T2, T2 flair, SWI and diffusion weighted views.  T1 sagittal and T2 coronal views were obtained. CONTRAST: no COMPARISON: none IMAGING SITE: [facility] [address] (1.5 Tesla MRI)  FINDINGS: No abnormal lesions are seen on diffusion-weighted views to suggest acute ischemia. The cortical sulci, fissures and cisterns are notable for mild diffuse and moderate mesial temporal atrophy. Lateral, third and fourth ventricle are normal in size and appearance. No extra-axial fluid collections are seen. No evidence of mass effect or midline shift.  Scattered subcortical, periventricular chronic small vessel ischemic disease. On sagittal views the posterior fossa, pituitary gland and corpus callosum are unremarkable. No evidence of intracranial hemorrhage on SWI views. The orbits and their contents, paranasal sinuses and calvarium are notable for bilateral lens extractions and mild mucosal thickening in the frontal, maxillary, ethmoid and sphenoid sinuses.  Intracranial flow voids are present.   Abnormal MRI brain (without) demonstrating: 1. Mild diffuse and moderate mesial temporal atrophy. 2. Scattered subcortical,  periventricular chronic small vessel ischemic disease. 3. No acute findings. INTERPRETING PHYSICIAN: Penni Bombard, MD Certified in Neurology, Neurophysiology and Neuroimaging [facility] [address], [place], [place] [address] [phone]    Assessment & Plan:   There are no diagnoses linked to this encounter. I have discontinued Mr. Groesbeck promethazine-codeine. I am also having him maintain his traZODone, RAPAFLO, Multiple Vitamin, ranitidine, and LORazepam. We will continue to administer sodium chloride and sodium chloride.  No orders of the defined types were placed in this encounter.    Follow-up: No Follow-up on file.  Walker Kehr, MD

## 2016-10-04 NOTE — Assessment & Plan Note (Signed)
No relapse 

## 2016-10-05 ENCOUNTER — Telehealth: Payer: Self-pay | Admitting: Internal Medicine

## 2016-10-05 NOTE — Telephone Encounter (Signed)
Spouse calling requesting call back on labs.  States has some concerns.  Spouse is on DPR.

## 2016-10-07 NOTE — Telephone Encounter (Signed)
Called spouse and she stated she wrote a letter and brought it up here for Dr. Alain Marion to read.

## 2016-10-12 ENCOUNTER — Other Ambulatory Visit: Payer: Self-pay | Admitting: Internal Medicine

## 2016-10-12 MED ORDER — EZETIMIBE 10 MG PO TABS: 10.0000 mg | ORAL_TABLET | Freq: Every day | ORAL | 3 refills | Status: DC

## 2016-10-12 NOTE — Telephone Encounter (Signed)
Pts wife called stating that she received a call earlier today about her husband's labs. The call cut off before the report was given. She can be reached at [phone].

## 2016-10-12 NOTE — Telephone Encounter (Signed)
Patient came in to office, asked if dr plotnikov had seen her letter yet---dr plotnikov has opened letter and per dr plotnikov, no real known cause for increased lipids (he has read letter), but is suggesting patient start zetia medication (not a statin) to help with increased values---I have advised wife in waiting room---she has been given script for zetia

## 2016-10-21 DIAGNOSIS — R972 Elevated prostate specific antigen [PSA]: Secondary | ICD-10-CM | POA: Diagnosis not present

## 2016-10-21 DIAGNOSIS — E291 Testicular hypofunction: Secondary | ICD-10-CM | POA: Diagnosis not present

## 2016-10-26 DIAGNOSIS — D1801 Hemangioma of skin and subcutaneous tissue: Secondary | ICD-10-CM | POA: Diagnosis not present

## 2016-10-26 DIAGNOSIS — L821 Other seborrheic keratosis: Secondary | ICD-10-CM | POA: Diagnosis not present

## 2016-10-26 DIAGNOSIS — Z85828 Personal history of other malignant neoplasm of skin: Secondary | ICD-10-CM | POA: Diagnosis not present

## 2016-10-26 DIAGNOSIS — L812 Freckles: Secondary | ICD-10-CM | POA: Diagnosis not present

## 2016-11-16 DIAGNOSIS — N400 Enlarged prostate without lower urinary tract symptoms: Secondary | ICD-10-CM | POA: Diagnosis not present

## 2016-11-16 DIAGNOSIS — E291 Testicular hypofunction: Secondary | ICD-10-CM | POA: Diagnosis not present

## 2016-12-02 DIAGNOSIS — Z23 Encounter for immunization: Secondary | ICD-10-CM | POA: Diagnosis not present

## 2016-12-31 ENCOUNTER — Telehealth: Payer: Self-pay | Admitting: Internal Medicine

## 2016-12-31 MED ORDER — AZITHROMYCIN 250 MG PO TABS: ORAL_TABLET | ORAL | 0 refills | Status: DC

## 2016-12-31 NOTE — Telephone Encounter (Signed)
[id number] - [place], [place] [address] [phone] (Phone) [phone] (Fax)   Patients wife called and said they are going out of town on Sunday and would like a zpack called. She started Dr.Plot does this every year for them because they easily get sick. Please advise.

## 2016-12-31 NOTE — Telephone Encounter (Signed)
Ok Thx 

## 2016-12-31 NOTE — Telephone Encounter (Signed)
LVM to inform patient RX was sent to pharmacy.

## 2017-01-02 ENCOUNTER — Other Ambulatory Visit: Payer: Self-pay | Admitting: Internal Medicine

## 2017-01-17 ENCOUNTER — Other Ambulatory Visit: Payer: Medicare Other

## 2017-01-17 ENCOUNTER — Telehealth: Payer: Self-pay | Admitting: Gastroenterology

## 2017-01-17 DIAGNOSIS — R197 Diarrhea, unspecified: Secondary | ICD-10-CM

## 2017-01-17 NOTE — Telephone Encounter (Signed)
The pt states he has been having explosive diarrhea, x 2 months.  2-3 times daily.  Sometimes he can not make it to the bathroom.  No further symptoms.  Please advise

## 2017-01-17 NOTE — Telephone Encounter (Signed)
He needs stool testing gi path panel, rov with extender or me in next 2-3 weeks, can start a single imodium once daily in AM until then.  Thanks

## 2017-01-17 NOTE — Telephone Encounter (Signed)
Left message on machine to call back  

## 2017-01-17 NOTE — Telephone Encounter (Signed)
The pt will pick up stool testing kit today and will begin imodium as recommended.  An appt with Keane Scrape has been scheduled for 12/28

## 2017-01-21 LAB — GASTROINTESTINAL PATHOGEN PANEL PCR
C. DIFFICILE TOX A/B, PCR: NOT DETECTED
CAMPYLOBACTER, PCR: NOT DETECTED
CRYPTOSPORIDIUM, PCR: NOT DETECTED
E coli (ETEC) LT/ST PCR: NOT DETECTED
E coli (STEC) stx1/stx2, PCR: NOT DETECTED
E coli 0157, PCR: NOT DETECTED
Giardia lamblia, PCR: NOT DETECTED
Norovirus, PCR: NOT DETECTED
ROTAVIRUS, PCR: NOT DETECTED
Salmonella, PCR: NOT DETECTED
Shigella, PCR: NOT DETECTED

## 2017-02-11 ENCOUNTER — Ambulatory Visit (INDEPENDENT_AMBULATORY_CARE_PROVIDER_SITE_OTHER): Payer: Medicare Other | Admitting: Gastroenterology

## 2017-02-11 ENCOUNTER — Encounter: Payer: Self-pay | Admitting: Gastroenterology

## 2017-02-11 VITALS — BP 144/66 | HR 72 | Ht 66.0 in | Wt 152.5 lb

## 2017-02-11 DIAGNOSIS — R197 Diarrhea, unspecified: Secondary | ICD-10-CM

## 2017-02-11 HISTORY — DX: Diarrhea, unspecified: R19.7

## 2017-02-11 NOTE — Progress Notes (Signed)
02/11/2017 [name] Browning [id number] December 18, 1940   HISTORY OF PRESENT ILLNESS:  Patient is here for follow-up of his diarrhea.  Called here on 12/3 with complaints of explosive and uncontrollable diarrhea.  Had one other similar episode over a year ago.  Has been improving/resolving.  Stool studies neg.  Is only taking one Imodium a day.  Denies rectal bleeding or abdominal pain.  Is up-to-date with colonoscopy, last was 02/2016 with nothing to explain diarrhea.  His wife says that he has been drinking a ton of tea and wondered if that could have contributed.    Past Medical History:  Diagnosis Date  . ADD (attention deficit disorder)   . Anxiety   . Asthma    AS A CHILD  . Atrial flutter ([place])    a. s/p RFCA 07/2011.  Xarelto d/c 08/2011.  . Cataracts, bilateral   . Conjunctivitis 09/14/2013  . Depression   . DJD (degenerative joint disease) of knee   . Dysrhythmia    hx a-flutter with successful ablation 2013  . GERD (gastroesophageal reflux disease)   . Heart murmur    a. 06/2011 Echo: 65-70%, mild MR  . Hyperlipidemia   . S/P cardiac cath    a. in 1990's - Dr. Gwenlyn Found - WNL  . Sleep apnea    could not tolerate cpap, uses mouth guard now   Past Surgical History:  Procedure Laterality Date  . ATRIAL FLUTTER ABLATION N/A 08/06/2011   Procedure: ATRIAL FLUTTER ABLATION;  Surgeon: Deboraha Sprang, MD;  Location: [facility] CATH LAB;  Service: Cardiovascular;  Laterality: N/A;  . COLONOSCOPY    . LESION REMOVAL N/A 08/02/2014   Procedure: EXCISION UPPER LIP VASCULAR ANOMALY ;  Surgeon: Irene Limbo, MD;  Location: [place];  Service: Plastics;  Laterality: N/A;  . MASTOIDECTOMY    . POLYPECTOMY    . TONSILLECTOMY    . WRIST FRACTURE SURGERY  2007   left    reports that he quit smoking about 49 years ago. His smoking use included cigarettes. he has never used smokeless tobacco. He reports that he does not drink alcohol or use drugs. family history includes Heart  disease in his mother; Hypertension in his brother; Leukemia in his mother; Prostate cancer in his brother and father; Stroke in his father. Allergies  Allergen Reactions  . Simvastatin     Memory issues  . Trintellix [Vortioxetine]     Memory loss      Outpatient Encounter Medications as of 02/11/2017  Medication Sig  . Cholecalciferol (VITAMIN D3) 2000 units capsule Take 1 capsule (2,000 Units total) by mouth daily.  [name] ezetimibe (ZETIA) 10 MG tablet TAKE 1 TABLET BY MOUTH EVERY DAY  . LORazepam (ATIVAN) 1 MG tablet Take 1 mg by mouth at bedtime.  [name] MEGARED OMEGA-3 KRILL OIL 500 MG CAPS Take 1 capsule by mouth every morning.  . Multiple Vitamin tablet Take 1 tablet by mouth daily.  . ranitidine (ZANTAC) 150 MG tablet Take 150 mg by mouth at bedtime.   [name] RAPAFLO 8 MG CAPS capsule Take 8 mg by mouth daily with breakfast.   . traZODone (DESYREL) 50 MG tablet Take 100 mg by mouth at bedtime.   . [DISCONTINUED] azithromycin (ZITHROMAX Z-PAK) 250 MG tablet As directed   No facility-administered encounter medications on file as of 02/11/2017.      REVIEW OF SYSTEMS  : All other systems reviewed and negative except where noted in the History of Present  Illness.   PHYSICAL EXAM: BP (!) 144/66   Pulse 72   Ht 5\' 6"  (1.676 m)   Wt 152 lb 8 oz (69.2 kg)   BMI 24.61 kg/m  General: Well developed white male in no acute distress Head: Normocephalic and atraumatic Eyes:  Sclerae anicteric, conjunctiva pink. Ears: Normal auditory acuity Lungs: Clear throughout to auscultation; no increased WOB. Heart: Regular rate and rhythm; no M/R/G. Abdomen: Soft, non-distended.  BS present.  Non-tender. Musculoskeletal: Symmetrical with no gross deformities  Skin: No lesions on visible extremities Extremities: No edema  Neurological: Alert oriented x 4, grossly non-focal Psychological:  Alert and cooperative. Normal mood and affect  ASSESSMENT AND PLAN: *Diarrhea:  Had one other similar  episode over a year ago.  Has been improving/resolving.  Stool studies neg.  I think that he can try to discontinue the Imodium, go down to 1/2 pill daily for several days then discontinue.  Will start daily probiotic such as Florastor or Culturelle.  Should cut back on tea consumption as the caffeine may be contributing some.  Call back for ongoing symptoms.   CC:  Plotnikov, Evie Lacks, MD

## 2017-02-11 NOTE — Progress Notes (Signed)
I agree with the above note, plan 

## 2017-02-11 NOTE — Patient Instructions (Signed)
Consider probiotic such as Culturelle or Florastor.   Cut back on caffeine including tea consumption.   Decrease imodium gradually.

## 2017-02-23 ENCOUNTER — Ambulatory Visit (INDEPENDENT_AMBULATORY_CARE_PROVIDER_SITE_OTHER): Payer: Medicare Other | Admitting: Internal Medicine

## 2017-02-23 ENCOUNTER — Encounter: Payer: Self-pay | Admitting: Internal Medicine

## 2017-02-23 VITALS — BP 132/82 | HR 60 | Ht 66.0 in | Wt 153.0 lb

## 2017-02-23 DIAGNOSIS — I483 Typical atrial flutter: Secondary | ICD-10-CM

## 2017-02-23 NOTE — Progress Notes (Addendum)
Patient Care Team: Plotnikov, Evie Lacks, MD as PCP - General (Internal Medicine) Chucky May, MD as Consulting Physician (Psychiatry) Kathie Rhodes, MD as Consulting Physician (Urology)   HPI  [name] Browning is a 77 y.o. male  has a history of atrial flutter which he underwent catheter ablation June 2013.  He was seen a year ago because of irregular palpitations as identified by his urologist. The concern was atrial fibrillation. None has been identified  Echo 5/17 normal LV function  Myoview demonstrated no ischemia    The patient denies chest pain, shortness of breath, nocturnal dyspnea, orthopnea or peripheral edema.  There have been no palpitations, lightheadedness or syncope.     Past Medical History:  Diagnosis Date  . ADD (attention deficit disorder)   . Anxiety   . Asthma    AS A CHILD  . Atrial flutter ([employer])    a. s/p RFCA 07/2011.  Xarelto d/c 08/2011.  . Cataracts, bilateral   . Conjunctivitis 09/14/2013  . Depression   . DJD (degenerative joint disease) of knee   . Dysrhythmia    hx a-flutter with successful ablation 2013  . GERD (gastroesophageal reflux disease)   . Heart murmur    a. 06/2011 Echo: 65-70%, mild MR  . Hyperlipidemia   . S/P cardiac cath    a. in 1990's - Dr. Gwenlyn Found - WNL  . Sleep apnea    could not tolerate cpap, uses mouth guard now    Past Surgical History:  Procedure Laterality Date  . ATRIAL FLUTTER ABLATION N/A 08/06/2011   Procedure: ATRIAL FLUTTER ABLATION;  Surgeon: Deboraha Sprang, MD;  Location: [facility] CATH LAB;  Service: Cardiovascular;  Laterality: N/A;  . COLONOSCOPY    . LESION REMOVAL N/A 08/02/2014   Procedure: EXCISION UPPER LIP VASCULAR ANOMALY ;  Surgeon: Irene Limbo, MD;  Location: [place];  Service: Plastics;  Laterality: N/A;  . MASTOIDECTOMY    . POLYPECTOMY    . TONSILLECTOMY    . WRIST FRACTURE SURGERY  2007   left    Current Outpatient Medications  Medication Sig Dispense Refill   . Cholecalciferol (VITAMIN D3) 2000 units capsule Take 1 capsule (2,000 Units total) by mouth daily. 100 capsule 3  . cyanocobalamin 1000 MCG tablet Take 1,000 mcg by mouth daily.    [name] ezetimibe (ZETIA) 10 MG tablet TAKE 1 TABLET BY MOUTH EVERY DAY 90 tablet 3  . LORazepam (ATIVAN) 1 MG tablet Take 1 mg by mouth at bedtime.    [name] MEGARED OMEGA-3 KRILL OIL 500 MG CAPS Take 1 capsule by mouth every morning. 100 capsule 3  . Multiple Vitamin tablet Take 1 tablet by mouth daily.    . ranitidine (ZANTAC) 150 MG tablet Take 150 mg by mouth at bedtime.     [name] RAPAFLO 8 MG CAPS capsule Take 8 mg by mouth daily with breakfast.     . traZODone (DESYREL) 50 MG tablet Take 100 mg by mouth at bedtime.      No current facility-administered medications for this visit.     Allergies  Allergen Reactions  . Simvastatin     Memory issues  . Trintellix [Vortioxetine]     Memory loss      Review of Systems negative except from HPI and PMH  Physical Exam BP 132/82   Pulse 60   Ht 5\' 6"  (1.676 m)   Wt 153 lb (69.4 kg)   SpO2 98%   BMI 24.69  kg/m  Well developed and nourished in no acute distress HENT normal Neck supple with JVP-flat Clear Regular rate and rhythm, no murmurs or gallops Abd-soft with active BS No Clubbing cyanosis edema Skin-warm and dry A & Oriented  Grossly normal sensory and motor function  ECG sinus @ 52 23/08/42 wth freq blocked PACs   Assessment and  Plan Tachypalpitations without documentation   Hx of Atrial flutter s/p ablation   Hypertension  Sinus bradycardia    BP well controlled  No palpitations  Exercise tolerance remains exceptional despite sinus bradycardia   We spent more than 50% of our >25 min visit in face to face counseling regarding the above

## 2017-02-23 NOTE — Addendum Note (Signed)
Addended by: Deboraha Sprang on: 02/23/2017 05:25 PM   Modules accepted: Level of Service

## 2017-02-23 NOTE — Patient Instructions (Signed)
Medication Instructions:  Your physician recommends that you continue on your current medications as directed. Please refer to the Current Medication list given to you today.  * If you need a refill on your cardiac medications before your next appointment, please call your pharmacy.   Labwork: None ordered  Testing/Procedures: None ordered  Follow-Up: Your physician wants you to follow-up in: 1 year with Dr. Klein.  You will receive a reminder letter in the mail two months in advance. If you don't receive a letter, please call our office to schedule the follow-up appointment.  Thank you for choosing [facility]!!         

## 2017-05-05 DIAGNOSIS — Z961 Presence of intraocular lens: Secondary | ICD-10-CM | POA: Diagnosis not present

## 2017-05-25 ENCOUNTER — Ambulatory Visit (INDEPENDENT_AMBULATORY_CARE_PROVIDER_SITE_OTHER): Payer: Medicare Other | Admitting: *Deleted

## 2017-05-25 VITALS — BP 134/72 | HR 62 | Resp 18 | Ht 66.0 in | Wt 149.0 lb

## 2017-05-25 DIAGNOSIS — Z0000 Encounter for general adult medical examination without abnormal findings: Secondary | ICD-10-CM

## 2017-05-25 NOTE — Progress Notes (Addendum)
Subjective:   [name] Browning is a 77 y.o. male who presents for an Initial Medicare Annual Wellness Visit.  Review of Systems  No ROS.  Medicare Wellness Visit. Additional risk factors are reflected in the social history.  Cardiac Risk Factors include: advanced age (>35men, >32 women);male gender Sleep patterns: feels rested on waking, gets up 1 times nightly to void and sleeps 7-8 hours nightly.    Home Safety/Smoke Alarms: Feels safe in home. Smoke alarms in place.  Living environment; residence and Firearm Safety: [place], no firearms Lives with wife, no needs for DME, good support system. Seat Belt Safety/Bike Helmet: Wears seat belt.   PSA-  Lab Results  Component Value Date   PSA 0.49 04/15/2008   PSA 1.21 03/23/2007      Objective:    Today's Vitals   05/25/17 1127  BP: 134/72  Pulse: 62  Resp: 18  SpO2: 98%  Weight: 149 lb (67.6 kg)  Height: 5\' 6"  (1.676 m)   Body mass index is 24.05 kg/m.  Advanced Directives 05/25/2017 11/21/2015 11/04/2014 08/02/2014 07/31/2014 07/29/2014 08/06/2011  Does Patient Have a Medical Advance Directive? Yes Yes No No Yes No Patient has advance directive, copy not in chart  Type of Advance Directive [place];Living will [place];Living will - - [place];Living will - [place]  Does patient want to make changes to medical advance directive? - - - - No - Patient declined - -  Copy of [place] in Chart? No - copy requested No - copy requested - - - - -  Would patient like information on creating a medical advance directive? - - No - patient declined information No - patient declined information - - -  Pre-existing out of facility DNR order (yellow form or pink MOST form) - - - - - - No    Current Medications (verified) Outpatient Encounter Medications as of 05/25/2017  Medication Sig  . Cholecalciferol (VITAMIN D3) 2000 units  capsule Take 1 capsule (2,000 Units total) by mouth daily.  . cyanocobalamin 1000 MCG tablet Take 1,000 mcg by mouth daily.  [name] ezetimibe (ZETIA) 10 MG tablet TAKE 1 TABLET BY MOUTH EVERY DAY  . LORazepam (ATIVAN) 1 MG tablet Take 1 mg by mouth at bedtime.  [name] MEGARED OMEGA-3 KRILL OIL 500 MG CAPS Take 1 capsule by mouth every morning.  . Multiple Vitamin tablet Take 1 tablet by mouth daily.  . ranitidine (ZANTAC) 150 MG tablet Take 150 mg by mouth at bedtime.   [name] RAPAFLO 8 MG CAPS capsule Take 8 mg by mouth daily with breakfast.   . traZODone (DESYREL) 50 MG tablet Take 100 mg by mouth at bedtime.    No facility-administered encounter medications on file as of 05/25/2017.     Allergies (verified) Simvastatin and Trintellix [vortioxetine]   History: Past Medical History:  Diagnosis Date  . ADD (attention deficit disorder)   . Anxiety   . Asthma    AS A CHILD  . Atrial flutter ([place])    a. s/p RFCA 07/2011.  Xarelto d/c 08/2011.  . Cataracts, bilateral   . Conjunctivitis 09/14/2013  . Depression   . DJD (degenerative joint disease) of knee   . Dysrhythmia    hx a-flutter with successful ablation 2013  . GERD (gastroesophageal reflux disease)   . Heart murmur    a. 06/2011 Echo: 65-70%, mild MR  . Hyperlipidemia   . S/P  cardiac cath    a. in 1990's - Dr. Gwenlyn Found - WNL  . Sleep apnea    could not tolerate cpap, uses mouth guard now   Past Surgical History:  Procedure Laterality Date  . ATRIAL FLUTTER ABLATION N/A 08/06/2011   Procedure: ATRIAL FLUTTER ABLATION;  Surgeon: Deboraha Sprang, MD;  Location[facility] CATH LAB;  Service: Cardiovascular;  Laterality: N/A;  . COLONOSCOPY    . LESION REMOVAL N/A 08/02/2014   Procedure: EXCISION UPPER LIP VASCULAR ANOMALY ;  Surgeon: Irene Limbo, MD;  Location: [place];  Service: Plastics;  Laterality: N/A;  . MASTOIDECTOMY    . POLYPECTOMY    . TONSILLECTOMY    . WRIST FRACTURE SURGERY  2007   left   Family History    Problem Relation Age of Onset  . Heart disease Mother   . Leukemia Mother   . Prostate cancer Father   . Stroke Father   . Hypertension Brother   . Prostate cancer Brother   . Colon cancer Neg Hx   . Stomach cancer Neg Hx   . Heart attack Neg Hx    Social History   Socioeconomic History  . Marital status: Married    Spouse name: Not on file  . Number of children: Not on file  . Years of education: Not on file  . Highest education level: Not on file  Occupational History  . Occupation: RetiredWater quality scientist for [employer]  Social Needs  . Financial resource strain: Not hard at all  . Food insecurity:    Worry: Never true    Inability: Never true  . Transportation needs:    Medical: No    Non-medical: No  Tobacco Use  . Smoking status: Former Smoker    Types: Cigarettes    Last attempt to quit: 02/16/1968    Years since quitting: 49.3  . Smokeless tobacco: Never Used  Substance and Sexual Activity  . Alcohol use: No  . Drug use: No  . Sexual activity: Yes  Lifestyle  . Physical activity:    Days per week: 4 days    Minutes per session: 60 min  . Stress: Only a little  Relationships  . Social connections:    Talks on phone: More than three times a week    Gets together: More than three times a week    Attends religious service: More than 4 times per year    Active member of club or organization: Yes    Attends meetings of clubs or organizations: More than 4 times per year    Relationship status: Married  Other Topics Concern  . Not on file  Social History Narrative  . Not on file   Tobacco Counseling Counseling given: Not Answered  Activities of Daily Living In your present state of health, do you have any difficulty performing the following activities: 05/25/2017  Hearing? N  Vision? N  Difficulty concentrating or making decisions? N  Walking or climbing stairs? N  Dressing or bathing? N  Doing errands, shopping? N  Preparing Food and eating ? N  Using  the Toilet? N  In the past six months, have you accidently leaked urine? N  Do you have problems with loss of bowel control? N  Managing your Medications? N  Managing your Finances? N  Housekeeping or managing your Housekeeping? N  Some recent data might be hidden     Immunizations and Health Maintenance Immunization History  Administered Date(s) Administered  .  Influenza Split 01/15/2013, 01/28/2014  . Influenza Whole 01/16/2011, 01/06/2012, 11/16/2015  . Influenza,inj,Quad PF,6+ Mos 11/18/2014  . Pneumococcal Conjugate-13 07/22/2014  . Pneumococcal Polysaccharide-23 04/15/2008, 06/07/2016  . Tdap 06/08/2011   There are no preventive care reminders to display for this patient.  Patient Care Team: Plotnikov, Evie Lacks, MD as PCP - General (Internal Medicine) Chucky May, MD as Consulting Physician (Psychiatry) Kathie Rhodes, MD as Consulting Physician (Urology) Deboraha Sprang, MD as Consulting Physician (Cardiology)  Indicate any recent Medical Services you may have received from other than [facility] providers in the past year (date may be approximate).    Assessment:   This is a routine wellness examination for Jostin. Physical assessment deferred to PCP.   Hearing/Vision screen Hearing Screening Comments: Able to hear conversational tones w/o difficulty. No issues reported.  Passed whisper test Vision Screening Comments: appointment yearly Dr. Gershon Crane  Dietary issues and exercise activities discussed: Current Exercise Habits: Structured exercise class;Home exercise routine, Type of exercise: walking;calisthenics;strength training/weights;stretching(playing golf), Time (Minutes): 45, Frequency (Times/Week): 4, Weekly Exercise (Minutes/Week): 180, Intensity: Moderate, Exercise limited by: None identified  Diet (meal preparation, eat out, water intake, caffeinated beverages, dairy products, fruits and vegetables): in general, a "healthy" diet  , well balanced, eats a variety of  fruits and vegetables daily, limits salt, fat/cholesterol, sugar,carbohydrates,caffeine, drinks 6-8 glasses of water daily.  Goals    . Patient Stated     Continue to enjoy reading, exercise, and eat healthy. Enjoy life and travel.      Depression Screen PHQ 2/9 Scores 05/25/2017 10/04/2016 06/07/2016  PHQ - 2 Score 1 0 0  PHQ- 9 Score 1 0 -    Fall Risk Fall Risk  05/25/2017 06/07/2016  Falls in the past year? No No  Risk for fall due to : Impaired mobility;Impaired balance/gait -   Cognitive Function: MMSE - Mini Mental State Exam 05/25/2017 05/19/2016  Not completed: Refused -  Orientation to time - 5  Orientation to Place - 5  Registration - 3  Attention/ Calculation - 5  Recall - 2  Language- name 2 objects - 2  Language- repeat - 1  Language- follow 3 step command - 3  Language- read & follow direction - 1  Write a sentence - 1  Copy design - 1  Total score - 29       Ad8 score reviewed for issues:  Issues making decisions: no  Less interest in hobbies / activities: no  Repeats questions, stories (family complaining): no  Trouble using ordinary gadgets (microwave, computer, phone):no  Forgets the month or year: no  Mismanaging finances: no  Remembering appts: no  Daily problems with thinking and/or memory: no Ad8 score is= 0  Screening Tests Health Maintenance  Topic Date Due  . INFLUENZA VACCINE  11/10/2017 (Originally 09/15/2017)  . TETANUS/TDAP  06/07/2021  . PNA vac Low Risk Adult  Completed      Plan:    Continue doing brain stimulating activities (puzzles, reading, adult coloring books, staying active) to keep memory sharp.   Continue to eat heart healthy diet (full of fruits, vegetables, whole grains, lean protein, water--limit salt, fat, and sugar intake) and increase physical activity as tolerated.  I have personally reviewed and noted the following in the patient's chart:   . Medical and social history . Use of alcohol, tobacco or illicit  drugs  . Current medications and supplements . Functional ability and status . Nutritional status . Physical activity . Advanced directives . List  of other physicians . Vitals . Screenings to include cognitive, depression, and falls . Referrals and appointments  In addition, I have reviewed and discussed with patient certain preventive protocols, quality metrics, and best practice recommendations. A written personalized care plan for preventive services as well as general preventive health recommendations were provided to patient.     Michiel Cowboy, RN   05/25/2017   Medical screening examination/treatment/procedure(s) were performed by non-physician practitioner and as supervising physician I was immediately available for consultation/collaboration. I agree with above. Lew Dawes, MD

## 2017-05-25 NOTE — Patient Instructions (Signed)
Continue doing brain stimulating activities (puzzles, reading, adult coloring books, staying active) to keep memory sharp.   Continue to eat heart healthy diet (full of fruits, vegetables, whole grains, lean protein, water--limit salt, fat, and sugar intake) and increase physical activity as tolerated.   [name] Browning , Thank you for taking time to come for your Medicare Wellness Visit. I appreciate your ongoing commitment to your health goals. Please review the following plan we discussed and let me know if I can assist you in the future.   These are the goals we discussed: Goals    . Patient Stated     Continue to enjoy reading, exercise, and eat healthy. Enjoy life and travel.       This is a list of the screening recommended for you and due dates:  Health Maintenance  Topic Date Due  . Flu Shot  11/10/2017*  . Tetanus Vaccine  06/07/2021  . Pneumonia vaccines  Completed  *Topic was postponed. The date shown is not the original due date.

## 2017-10-10 ENCOUNTER — Encounter: Payer: Self-pay | Admitting: Internal Medicine

## 2017-10-10 ENCOUNTER — Ambulatory Visit (INDEPENDENT_AMBULATORY_CARE_PROVIDER_SITE_OTHER): Payer: Medicare Other | Admitting: Internal Medicine

## 2017-10-10 DIAGNOSIS — I10 Essential (primary) hypertension: Secondary | ICD-10-CM | POA: Diagnosis not present

## 2017-10-10 DIAGNOSIS — F329 Major depressive disorder, single episode, unspecified: Secondary | ICD-10-CM

## 2017-10-10 DIAGNOSIS — D638 Anemia in other chronic diseases classified elsewhere: Secondary | ICD-10-CM | POA: Diagnosis not present

## 2017-10-10 DIAGNOSIS — E785 Hyperlipidemia, unspecified: Secondary | ICD-10-CM | POA: Diagnosis not present

## 2017-10-10 DIAGNOSIS — F32A Depression, unspecified: Secondary | ICD-10-CM

## 2017-10-10 MED ORDER — EZETIMIBE 10 MG PO TABS: 10.0000 mg | ORAL_TABLET | Freq: Every day | ORAL | 3 refills | Status: DC

## 2017-10-10 NOTE — Assessment & Plan Note (Signed)
CBC

## 2017-10-10 NOTE — Patient Instructions (Addendum)
Valerian root for insomnia   MC well w/Jill 

## 2017-10-10 NOTE — Assessment & Plan Note (Signed)
NAS diet, BP is ok Labs

## 2017-10-10 NOTE — Assessment & Plan Note (Signed)
Off Trazodone Valerian root

## 2017-10-10 NOTE — Assessment & Plan Note (Signed)
Zetia

## 2017-10-10 NOTE — Progress Notes (Signed)
Subjective:  Patient ID: [name] Browning, male    DOB: [birth year]  Age: 77 y.o. MRN: [id number]  CC: No chief complaint on file.   HPI [name] Browning presents for dyslipidemia C/o insomnia  Outpatient Medications Prior to Visit  Medication Sig Dispense Refill  . Cholecalciferol (VITAMIN D3) 2000 units capsule Take 1 capsule (2,000 Units total) by mouth daily. 100 capsule 3  . cyanocobalamin 1000 MCG tablet Take 1,000 mcg by mouth daily.    [name] ezetimibe (ZETIA) 10 MG tablet TAKE 1 TABLET BY MOUTH EVERY DAY 90 tablet 3  . MEGARED OMEGA-3 KRILL OIL 500 MG CAPS Take 1 capsule by mouth every morning. 100 capsule 3  . Multiple Vitamin tablet Take 1 tablet by mouth daily.    . ranitidine (ZANTAC) 150 MG tablet Take 150 mg by mouth at bedtime.     [name] RAPAFLO 8 MG CAPS capsule Take 8 mg by mouth daily with breakfast.     . LORazepam (ATIVAN) 1 MG tablet Take 1 mg by mouth at bedtime.    . traZODone (DESYREL) 50 MG tablet Take 100 mg by mouth at bedtime.      No facility-administered medications prior to visit.     ROS: Review of Systems  Constitutional: Negative for appetite change, fatigue and unexpected weight change.  HENT: Negative for congestion, nosebleeds, sneezing, sore throat and trouble swallowing.   Eyes: Negative for itching and visual disturbance.  Respiratory: Negative for cough.   Cardiovascular: Negative for chest pain, palpitations and leg swelling.  Gastrointestinal: Negative for abdominal distention, blood in stool, diarrhea and nausea.  Genitourinary: Negative for frequency and hematuria.  Musculoskeletal: Negative for back pain, gait problem, joint swelling and neck pain.  Skin: Negative for rash.  Neurological: Negative for dizziness, tremors, speech difficulty and weakness.  Psychiatric/Behavioral: Negative for agitation, dysphoric mood, sleep disturbance and suicidal ideas. The patient is not nervous/anxious.     Objective:  BP (!) 142/78 (BP Location: Left  Arm, Patient Position: Sitting, Cuff Size: Normal)   Pulse (!) 52   Temp 98 F (36.7 C) (Oral)   Ht 5\' 6"  (1.676 m)   Wt 147 lb (66.7 kg)   SpO2 96%   BMI 23.73 kg/m   BP Readings from Last 3 Encounters:  10/10/17 (!) 142/78  05/25/17 134/72  02/23/17 132/82    Wt Readings from Last 3 Encounters:  10/10/17 147 lb (66.7 kg)  05/25/17 149 lb (67.6 kg)  02/23/17 153 lb (69.4 kg)    Physical Exam  Constitutional: He is oriented to person, place, and time. He appears well-developed. No distress.  NAD  HENT:  Mouth/Throat: Oropharynx is clear and moist.  Eyes: Pupils are equal, round, and reactive to light. Conjunctivae are normal.  Neck: Normal range of motion. No JVD present. No thyromegaly present.  Cardiovascular: Normal rate, regular rhythm, normal heart sounds and intact distal pulses. Exam reveals no gallop and no friction rub.  No murmur heard. Pulmonary/Chest: Effort normal and breath sounds normal. No respiratory distress. He has no wheezes. He has no rales. He exhibits no tenderness.  Abdominal: Soft. Bowel sounds are normal. He exhibits no distension and no mass. There is no tenderness. There is no rebound and no guarding.  Musculoskeletal: Normal range of motion. He exhibits no edema or tenderness.  Lymphadenopathy:    He has no cervical adenopathy.  Neurological: He is alert and oriented to person, place, and time. He has normal reflexes. No cranial nerve deficit. He  exhibits normal muscle tone. He displays a negative Romberg sign. Coordination and gait normal.  Skin: Skin is warm and dry. No rash noted.  Psychiatric: He has a normal mood and affect. His behavior is normal. Judgment and thought content normal.    Lab Results  Component Value Date   WBC 6.1 06/07/2016   HGB 13.3 06/07/2016   HCT 39.3 06/07/2016   PLT 330.0 06/07/2016   GLUCOSE 112 (H) 06/07/2016   CHOL 263 (H) 10/04/2016   TRIG 165.0 (H) 10/04/2016   HDL 54.20 10/04/2016   LDLCALC 176 (H)  10/04/2016   ALT 33 06/07/2016   AST 26 06/07/2016   NA 140 06/07/2016   K 4.3 06/07/2016   CL 103 06/07/2016   CREATININE 1.24 06/07/2016   BUN 16 06/07/2016   CO2 29 06/07/2016   TSH 2.20 06/07/2016   PSA 0.49 04/15/2008   HGBA1C 5.8 (H) 05/19/2016    Mr Brain Wo Contrast  Result Date: 06/02/2016 [facility] NEUROIMAGING REPORT STUDY DATE: 06/01/16 PATIENT NAME: JERMICHAEL BELMARES DOB: March 13, 1940 MRN: [id number] ORDERING CLINICIAN: Andrey Spearman, MD CLINICAL HISTORY: 77 year old male with memory loss. EXAM: MRI brain (without) TECHNIQUE: MRI of the brain without contrast was obtained utilizing 5 mm axial slices with T1, T2, T2 flair, SWI and diffusion weighted views.  T1 sagittal and T2 coronal views were obtained. CONTRAST: no COMPARISON: none IMAGING SITE: [facility] [address] (1.5 Tesla MRI)  FINDINGS: No abnormal lesions are seen on diffusion-weighted views to suggest acute ischemia. The cortical sulci, fissures and cisterns are notable for mild diffuse and moderate mesial temporal atrophy. Lateral, third and fourth ventricle are normal in size and appearance. No extra-axial fluid collections are seen. No evidence of mass effect or midline shift.  Scattered subcortical, periventricular chronic small vessel ischemic disease. On sagittal views the posterior fossa, pituitary gland and corpus callosum are unremarkable. No evidence of intracranial hemorrhage on SWI views. The orbits and their contents, paranasal sinuses and calvarium are notable for bilateral lens extractions and mild mucosal thickening in the frontal, maxillary, ethmoid and sphenoid sinuses.  Intracranial flow voids are present.   Abnormal MRI brain (without) demonstrating: 1. Mild diffuse and moderate mesial temporal atrophy. 2. Scattered subcortical, periventricular chronic small vessel ischemic disease. 3. No acute findings. INTERPRETING PHYSICIAN: Penni Bombard, MD Certified in  Neurology, Neurophysiology and Neuroimaging [facility] [address], [place], [place] [address] [phone]    Assessment & Plan:   There are no diagnoses linked to this encounter.   No orders of the defined types were placed in this encounter.    Follow-up: No follow-ups on file.  Walker Kehr, MD

## 2017-10-11 ENCOUNTER — Other Ambulatory Visit (INDEPENDENT_AMBULATORY_CARE_PROVIDER_SITE_OTHER): Payer: Medicare Other

## 2017-10-11 DIAGNOSIS — D638 Anemia in other chronic diseases classified elsewhere: Secondary | ICD-10-CM | POA: Diagnosis not present

## 2017-10-11 DIAGNOSIS — E785 Hyperlipidemia, unspecified: Secondary | ICD-10-CM

## 2017-10-11 DIAGNOSIS — I10 Essential (primary) hypertension: Secondary | ICD-10-CM

## 2017-10-11 LAB — LIPID PANEL
CHOL/HDL RATIO: 4
Cholesterol: 238 mg/dL — ABNORMAL HIGH (ref 0–200)
HDL: 61 mg/dL (ref >39.00)
LDL Cholesterol: 151 mg/dL — ABNORMAL HIGH (ref 0–99)
NONHDL: 177.43
TRIGLYCERIDES: 132 mg/dL (ref 0.0–149.0)
VLDL: 26.4 mg/dL (ref 0.0–40.0)

## 2017-10-11 LAB — BASIC METABOLIC PANEL
BUN: 24 mg/dL — ABNORMAL HIGH (ref 6–23)
CALCIUM: 10 mg/dL (ref 8.4–10.5)
CO2: 30 meq/L (ref 19–32)
CREATININE: 1.15 mg/dL (ref 0.40–1.50)
Chloride: 103 mEq/L (ref 96–112)
GFR: 65.6 mL/min (ref >60.00)
Glucose, Bld: 102 mg/dL — ABNORMAL HIGH (ref 70–99)
Potassium: 4.3 mEq/L (ref 3.5–5.1)
Sodium: 140 mEq/L (ref 135–145)

## 2017-10-11 LAB — URINALYSIS
Bilirubin Urine: NEGATIVE
Hgb urine dipstick: NEGATIVE
Ketones, ur: NEGATIVE
LEUKOCYTES UA: NEGATIVE
NITRITE: NEGATIVE
Specific Gravity, Urine: 1.015 (ref 1.000–1.030)
Total Protein, Urine: NEGATIVE
Urine Glucose: NEGATIVE
Urobilinogen, UA: 0.2 (ref 0.0–1.0)
pH: 6 (ref 5.0–8.0)

## 2017-10-11 LAB — CBC WITH DIFFERENTIAL/PLATELET
BASOS ABS: 0 10*3/uL (ref 0.0–0.1)
Basophils Relative: 0.6 % (ref 0.0–3.0)
Eosinophils Absolute: 0.3 10*3/uL (ref 0.0–0.7)
Eosinophils Relative: 5.4 % — ABNORMAL HIGH (ref 0.0–5.0)
HEMATOCRIT: 39.1 % (ref 39.0–52.0)
HEMOGLOBIN: 13.5 g/dL (ref 13.0–17.0)
LYMPHS PCT: 31 % (ref 12.0–46.0)
Lymphs Abs: 1.7 10*3/uL (ref 0.7–4.0)
MCHC: 34.5 g/dL (ref 30.0–36.0)
MCV: 91.2 fl (ref 78.0–100.0)
MONOS PCT: 10.1 % (ref 3.0–12.0)
Monocytes Absolute: 0.5 10*3/uL (ref 0.1–1.0)
NEUTROS ABS: 2.8 10*3/uL (ref 1.4–7.7)
Neutrophils Relative %: 52.9 % (ref 43.0–77.0)
Platelets: 323 10*3/uL (ref 150.0–400.0)
RBC: 4.29 Mil/uL (ref 4.22–5.81)
RDW: 13.2 % (ref 11.5–15.5)
WBC: 5.4 10*3/uL (ref 4.0–10.5)

## 2017-10-11 LAB — HEPATIC FUNCTION PANEL
ALBUMIN: 4.5 g/dL (ref 3.5–5.2)
ALK PHOS: 79 U/L (ref 39–117)
ALT: 18 U/L (ref 0–53)
AST: 16 U/L (ref 0–37)
Bilirubin, Direct: 0.1 mg/dL (ref 0.0–0.3)
Total Bilirubin: 0.6 mg/dL (ref 0.2–1.2)
Total Protein: 7.6 g/dL (ref 6.0–8.3)

## 2017-10-11 LAB — TSH: TSH: 2.32 u[IU]/mL (ref 0.35–4.50)

## 2017-10-24 DIAGNOSIS — Z85828 Personal history of other malignant neoplasm of skin: Secondary | ICD-10-CM | POA: Diagnosis not present

## 2017-10-24 DIAGNOSIS — L821 Other seborrheic keratosis: Secondary | ICD-10-CM | POA: Diagnosis not present

## 2017-10-24 DIAGNOSIS — D2371 Other benign neoplasm of skin of right lower limb, including hip: Secondary | ICD-10-CM | POA: Diagnosis not present

## 2017-10-24 DIAGNOSIS — D1801 Hemangioma of skin and subcutaneous tissue: Secondary | ICD-10-CM | POA: Diagnosis not present

## 2017-10-24 DIAGNOSIS — I788 Other diseases of capillaries: Secondary | ICD-10-CM | POA: Diagnosis not present

## 2017-10-24 DIAGNOSIS — L218 Other seborrheic dermatitis: Secondary | ICD-10-CM | POA: Diagnosis not present

## 2017-10-24 DIAGNOSIS — L812 Freckles: Secondary | ICD-10-CM | POA: Diagnosis not present

## 2017-10-31 DIAGNOSIS — Z23 Encounter for immunization: Secondary | ICD-10-CM | POA: Diagnosis not present

## 2017-11-10 DIAGNOSIS — R972 Elevated prostate specific antigen [PSA]: Secondary | ICD-10-CM | POA: Diagnosis not present

## 2017-11-10 DIAGNOSIS — E291 Testicular hypofunction: Secondary | ICD-10-CM | POA: Diagnosis not present

## 2017-11-17 DIAGNOSIS — N400 Enlarged prostate without lower urinary tract symptoms: Secondary | ICD-10-CM | POA: Diagnosis not present

## 2017-11-17 DIAGNOSIS — E291 Testicular hypofunction: Secondary | ICD-10-CM | POA: Diagnosis not present

## 2018-01-24 ENCOUNTER — Telehealth: Payer: Self-pay | Admitting: Internal Medicine

## 2018-01-24 MED ORDER — AZITHROMYCIN 250 MG PO TABS: ORAL_TABLET | ORAL | 0 refills | Status: DC

## 2018-01-24 NOTE — Telephone Encounter (Signed)
Please advise 

## 2018-01-24 NOTE — Telephone Encounter (Signed)
Copied from [place] [phone]. Topic: Quick Communication - See Telephone Encounter >> Jan 24, 2018 10:03 AM Cecelia Byars, NT wrote: CRM for notification. See Telephone encounter for: 01/24/18. Patients wife called and would like for Dr Alain Marion to call in z pak for the patient at  [id number] - [place], [place] [phone] (Phone) [phone] (Fax)

## 2018-01-24 NOTE — Telephone Encounter (Signed)
Wife states he does every year while they are traveling for the holidays.

## 2018-01-24 NOTE — Telephone Encounter (Signed)
Ok Thx 

## 2018-04-25 DIAGNOSIS — H6122 Impacted cerumen, left ear: Secondary | ICD-10-CM | POA: Diagnosis not present

## 2018-05-04 ENCOUNTER — Telehealth: Payer: Self-pay | Admitting: Internal Medicine

## 2018-05-04 NOTE — Telephone Encounter (Signed)
Spoke with pt to clarify his symptoms. He states he was SOB when ambulating and carrying his bag around. He denies CP. This episode was intermittent but he would like to be seen in the office. I explained to him on the limited visits, but would discuss further with Dr Caryl Comes on Monday when he returns to the office. He was advised that if this happens again and is no relieved with rest, to report to the ED.   He was also counseled on the importance of staying home and limiting social gatherings as he was on the golf course today with a group of friends. He was educated that he is considered high risk with age and comorbidities and would likely require extended hospitalization if he were to contract COVID 19. He verbalized understanding and had no additional questions.

## 2018-05-04 NOTE — Telephone Encounter (Signed)
I spoke to the patient who was golfing today and decided to walk 4 holes after carting the first 2.  He began to "feel weak and his lips turned color".  He was with a physician who checked his pulse and said that it was ok, but "he failed his stress test".    He made his way back to the club house and was feeling great by then.  He experienced no CP, SOB or dizziness.  He feels great now, but says that this has happened once before.  I told him that I would forward for recommendations.  He verbalized understanding and was thankful for the call.

## 2018-05-04 NOTE — Telephone Encounter (Signed)
Pt had issue playing golf. Walking between holes, he ran out of energy, lips changed colors(turned pink), but was playing with a fellow Dr, and doctor took his pulse,and the pulse was ok, but the other doctor recommended he should  speak with Dr. Caryl Comes ASAP.  History of afib No CP  No SOB No dizziness  After resting, he feels better now.

## 2018-05-08 ENCOUNTER — Telehealth (INDEPENDENT_AMBULATORY_CARE_PROVIDER_SITE_OTHER): Payer: Medicare Other | Admitting: Internal Medicine

## 2018-05-08 ENCOUNTER — Other Ambulatory Visit: Payer: Self-pay

## 2018-05-08 ENCOUNTER — Telehealth: Payer: Self-pay | Admitting: Internal Medicine

## 2018-05-08 DIAGNOSIS — R0789 Other chest pain: Secondary | ICD-10-CM | POA: Diagnosis not present

## 2018-05-08 DIAGNOSIS — R0609 Other forms of dyspnea: Secondary | ICD-10-CM | POA: Diagnosis not present

## 2018-05-08 NOTE — Telephone Encounter (Signed)
Follow up    Patient is calling back in reference to the SOB that he experienced on the golf course on Friday. He is wanting to know what Dr. Caryl Comes is recommending and does he need to come in for an appt. Please call.

## 2018-05-08 NOTE — Progress Notes (Signed)
Tele-Health Visit     Evaluation Performed:  Follow-up visit  This visit type was conducted due to national recommendations for restrictions regarding the COVID-19 Pandemic (e.g. social distancing).  This format is felt to be most appropriate for this patient at this time.  All issues noted in this document were discussed and addressed.  No physical exam was performed (except for noted visual exam findings with Telehealth visits).  See MyChart message from today for the patient's consent to telehealth for [facility].  Date:  05/08/2018   ID:  [name] Browning, DOB 1940/03/28, MRN [id number]  Patient Location:  [address] [place] [address]   Provider location:   [place]  PCP:  Plotnikov, Evie Lacks, MD  Cardiologist:  No primary care provider on file.  SK Electrophysiologist:  None   Chief Complaint:  Exertional dyspnea  History of Present Illness:    [name] Browning is a 78 y.o. male who presents via audio/video conferencing for a telehealth visit today.  *\  Called with a complaint of dyspnea on exertion while Playing golf with friends  Tried to walk with the other guys Normally he rides in cart-- developed Dyspnea and pink lips; felt like he couldn't go on and was lightheaded.   MD golf partner said HR was stable  Tried about 4-5 yrs ago and same thing happened over the four holes-- specifically no different from a few years ago  Tried to walk a few weeks ago with pull golf cart with similar symptoms  Walked again Friday and Monday and played again golf on Saturday and felt very good  Specifically denies chest pain  Interval testing was reviewed including myoview echo and GXT      The patient does symptoms concerning for COVID-19 infection (fever, chills, cough, or new SHORTNESS OF BREATH).    Prior CV studies:   The following studies were reviewed today:     Past Medical History:  Diagnosis Date  . ADD (attention deficit disorder)    . Anxiety   . Asthma    AS A CHILD  . Atrial flutter ([place])    a. s/p RFCA 07/2011.  Xarelto d/c 08/2011.  . Cataracts, bilateral   . Conjunctivitis 09/14/2013  . Depression   . DJD (degenerative joint disease) of knee   . Dysrhythmia    hx a-flutter with successful ablation 2013  . GERD (gastroesophageal reflux disease)   . Heart murmur    a. 06/2011 Echo: 65-70%, mild MR  . Hyperlipidemia   . S/P cardiac cath    a. in 1990's - Dr. Gwenlyn Found - WNL  . Sleep apnea    could not tolerate cpap, uses mouth guard now   Past Surgical History:  Procedure Laterality Date  . ATRIAL FLUTTER ABLATION N/A 08/06/2011   Procedure: ATRIAL FLUTTER ABLATION;  Surgeon: Deboraha Sprang, MD;  Location: [facility] C[facility] LAB;  Service: Cardiovascular;  Laterality: N/A;  . COLONOSCOPY    . LESION REMOVAL N/A 08/02/2014   Procedure: EXCISION UPPER LIP VASCULAR ANOMALY ;  Surgeon: Irene Limbo, MD;  Location: [place];  Service: Plastics;  Laterality: N/A;  . MASTOIDECTOMY    . POLYPECTOMY    . TONSILLECTOMY    . WRIST FRACTURE SURGERY  2007   left     No outpatient medications have been marked as taking for the 05/08/18 encounter (Appointment) with Deboraha Sprang, MD.     Allergies:   Simvastatin and Trintellix [  vortioxetine]   Social History   Tobacco Use  . Smoking status: Former Smoker    Types: Cigarettes    Last attempt to quit: 02/16/1968    Years since quitting: 50.2  . Smokeless tobacco: Never Used  Substance Use Topics  . Alcohol use: No  . Drug use: No     Family Hx: The patient's family history includes Heart disease in his mother; Hypertension in his brother; Leukemia in his mother; Prostate cancer in his brother and father; Stroke in his father. There is no history of Colon cancer, Stomach cancer, or Heart attack.  ROS:   Please see the history of present illness.      All other systems reviewed and are negative.   Labs/Other Tests and Data Reviewed:    Recent Labs:  10/11/2017: ALT 18; BUN 24; Creatinine, Ser 1.15; Hemoglobin 13.5; Platelets 323.0; Potassium 4.3; Sodium 140; TSH 2.32   Recent Lipid Panel Lab Results  Component Value Date/Time   CHOL 238 (H) 10/11/2017 10:31 AM   TRIG 132.0 10/11/2017 10:31 AM   HDL 61.00 10/11/2017 10:31 AM   CHOLHDL 4 10/11/2017 10:31 AM   LDLCALC 151 (H) 10/11/2017 10:31 AM    Wt Readings from Last 3 Encounters:  10/10/17 147 lb (66.7 kg)  05/25/17 149 lb (67.6 kg)  02/23/17 153 lb (69.4 kg)     Exam:    Vital Signs:  There were no vitals taken for this visit.   Well nourished, well developed male in no * acute distress.    ASSESSMENT & PLAN:    1.  Exertional dyspnea Atrial fibrillation   The patient has exertional dyspnea which is largely unchanged over the last for 5 years.  While he walks regularly, walking on the golf course is not something that he has been able to do for some time.  Hence, I do not think that this is acute.  Furthermore, there is no swelling chest discomfort or anything else that he notes out of the ordinary.   We reviewed the data from 2-1/2 years ago well within the time spectrum of his symptoms, they suggested no significant ischemia.  LV function was normal.  Heart rate excursion was also normal  I suspect that this relates to deconditioning.  We have discussed trying to improve his exercise tolerance.  Possibility of atrial fibrillation was also raised.  Suggested he use an alive core monitor; in addition, his golf partner a physician, noted that his heart rhythm was normal.  COVID-19 Education: The signs and symptoms of COVID-19 were discussed with the patient and how to seek care for testing (follow up with PCP or arrange E-visit).   The importance of social distancing was discussed today.  Patient Risk:   After full review of this patients clinical status, I feel that they are at least moderate risk at this time.  Time:   Today, I have spent 35 minutes with the  patient with telehealth technology discussing dyspnea.     Medication Adjustments/Labs and Tests Ordered: Current medicines are reviewed at length with the patient today.  Concerns regarding medicines are outlined above.  Tests Ordered: No orders of the defined types were placed in this encounter.  Medication Changes: No orders of the defined types were placed in this encounter.   Disposition:  prn  Signed, Virl Axe, MD  05/08/2018 2:22 PM    [facility] [place], [place], [place]  [address] Phone: [phone]; Fax: [phone]

## 2018-05-08 NOTE — Telephone Encounter (Signed)
Pt scheduled for televisit with Dr Caryl Comes to address.

## 2018-05-08 NOTE — Telephone Encounter (Signed)
r 

## 2018-05-29 ENCOUNTER — Ambulatory Visit: Payer: Medicare Other

## 2018-07-11 NOTE — Progress Notes (Addendum)
Subjective:   [name] Browning is a 78 y.o. male who presents for Medicare Annual/Subsequent preventive examination. I connected with patient by a telephone and verified that I am speaking with the correct person using two identifiers. Patient stated full name and DOB. Patient gave permission to continue with telephonic visit. Patient's location was at home and Nurse's location was at [name].   Review of Systems:  No ROS.  Medicare Wellness Virtual Visit.  Visual/audio telehealth visit, UTA vital signs.   See social history for additional risk factors.    Sleep patterns: feels rested on waking, gets up 1-2 times nightly to void and sleeps 6-7 hours nightly. Wears sleep apnea mouth guard.   Home Safety/Smoke Alarms: Feels safe in home. Smoke alarms in place.  Living environment; residence and Firearm Safety: 1-story house/ trailer. Lives with wife, no needs for DME, good support system Seat Belt Safety/Bike Helmet: Wears seat belt.   PSA-  [facility] Results  Component Value Date   PSA 0.49 04/15/2008   PSA 1.21 03/23/2007       Objective:    Vitals: There were no vitals taken for this visit.  There is no height or weight on file to calculate BMI.  Advanced Directives 05/25/2017 11/21/2015 11/04/2014 08/02/2014 07/31/2014 07/29/2014 08/06/2011  Does Patient Have a Medical Advance Directive? Yes Yes No No Yes No Patient has advance directive, copy not in chart  Type of Advance Directive [name];Living will [name];Living will - - [name] Level;Living will - [name]  Does patient want to make changes to medical advance directive? - - - - No - Patient declined - -  Copy of [place] in Chart? No - copy requested No - copy requested - - - - -  Would patient like information on creating a medical advance directive? - - No - patient declined information No - patient declined information - - -   Pre-existing out of facility DNR order (yellow form or pink MOST form) - - - - - - No    Tobacco Social History   Tobacco Use  Smoking Status Former Smoker  . Types: Cigarettes  . Last attempt to quit: 02/16/1968  . Years since quitting: 50.4  Smokeless Tobacco Never Used     Counseling given: Not Answered  Past Medical History:  Diagnosis Date  . ADD (attention deficit disorder)   . Anxiety   . Asthma    AS A CHILD  . Atrial flutter ([name])    a. s/p RFCA 07/2011.  Xarelto d/c 08/2011.  . Cataracts, bilateral   . Conjunctivitis 09/14/2013  . Depression   . DJD (degenerative joint disease) of knee   . Dysrhythmia    hx a-flutter with successful ablation 2013  . GERD (gastroesophageal reflux disease)   . Heart murmur    a. 06/2011 Echo: 65-70%, mild MR  . Hyperlipidemia   . S/P cardiac cath    a. in 1990's - Dr. Gwenlyn Found - WNL  . Sleep apnea    could not tolerate cpap, uses mouth guard now   Past Surgical History:  Procedure Laterality Date  . ATRIAL FLUTTER ABLATION N/A 08/06/2011   Procedure: ATRIAL FLUTTER ABLATION;  Surgeon: Deboraha Sprang, MD;  Location: [name];  Service: Cardiovascular;  Laterality: N/A;  . COLONOSCOPY    . LESION REMOVAL N/A 08/02/2014   Procedure: EXCISION UPPER LIP VASCULAR ANOMALY ;  Surgeon: Irene Limbo,  MD;  Location: [name];  Service: Plastics;  Laterality: N/A;  . MASTOIDECTOMY    . POLYPECTOMY    . TONSILLECTOMY    . WRIST FRACTURE SURGERY  2007   left   Family History  Problem Relation Age of Onset  . Heart disease Mother   . Leukemia Mother   . Prostate cancer Father   . Stroke Father   . Hypertension Brother   . Prostate cancer Brother   . Colon cancer Neg Hx   . Stomach cancer Neg Hx   . Heart attack Neg Hx    Social History   Socioeconomic History  . Marital status: Married    Spouse name: Not on file  . Number of children: Not on file  . Years of education: Not on file  . Highest education  level: Not on file  Occupational History  . Occupation: RetiredWater quality scientist for [employer]  Social Needs  . Financial resource strain: Not hard at all  . Food insecurity:    Worry: Never true    Inability: Never true  . Transportation needs:    Medical: No    Non-medical: No  Tobacco Use  . Smoking status: Former Smoker    Types: Cigarettes    Last attempt to quit: 02/16/1968    Years since quitting: 50.4  . Smokeless tobacco: Never Used  Substance and Sexual Activity  . Alcohol use: No  . Drug use: No  . Sexual activity: Yes  Lifestyle  . Physical activity:    Days per week: 4 days    Minutes per session: 60 min  . Stress: Only a little  Relationships  . Social connections:    Talks on phone: More than three times a week    Gets together: More than three times a week    Attends religious service: More than 4 times per year    Active member of club or organization: Yes    Attends meetings of clubs or organizations: More than 4 times per year    Relationship status: Married  Other Topics Concern  . Not on file  Social History Narrative  . Not on file    Outpatient Encounter Medications as of 07/12/2018  Medication Sig  . azithromycin (ZITHROMAX Z-PAK) 250 MG tablet As directed  . Cholecalciferol (VITAMIN D3) 2000 units capsule Take 1 capsule (2,000 Units total) by mouth daily.  . cyanocobalamin 1000 MCG tablet Take 1,000 mcg by mouth daily.  [name] ezetimibe (ZETIA) 10 MG tablet Take 1 tablet (10 mg total) by mouth daily.  [name] MEGARED OMEGA-3 KRILL OIL 500 MG CAPS Take 1 capsule by mouth every morning.  . Multiple Vitamin tablet Take 1 tablet by mouth daily.  . ranitidine (ZANTAC) 150 MG tablet Take 150 mg by mouth at bedtime.   [name] RAPAFLO 8 MG CAPS capsule Take 8 mg by mouth daily with breakfast.    No facility-administered encounter medications on file as of 07/12/2018.     Activities of Daily Living No flowsheet data found.  Patient Care Team: Plotnikov, Evie Lacks, MD  as PCP - General (Internal Medicine) Chucky May, MD as Consulting Physician (Psychiatry) Kathie Rhodes, MD as Consulting Physician (Urology) Deboraha Sprang, MD as Consulting Physician (Cardiology)   Assessment:   This is a routine wellness examination for [name] [facility]ning.  Physical assessment deferred to PCP.  Exercise Activities and Dietary recommendations   Diet (meal preparation, eat out, water intake, caffeinated beverages, dairy products, fruits and  vegetables): in general, a "healthy" diet  , well balanced  eats a variety of fruits and vegetables daily, limits salt, fat/cholesterol, sugar,carbohydrates,caffeine, drinks 6-8 glasses of water daily.  Goals    . Patient Stated     Continue to enjoy reading, exercise, and eat healthy. Enjoy life and travel.       Fall Risk Fall Risk  05/25/2017 06/07/2016  Falls in the past year? No No  Risk for fall due to : Impaired mobility;Impaired balance/gait -   Depression Screen PHQ 2/9 Scores 05/25/2017 10/04/2016 06/07/2016  PHQ - 2 Score 1 0 0  PHQ- 9 Score 1 0 -    Cognitive Function MMSE - Mini Mental State Exam 05/25/2017 05/19/2016  Not completed: Refused -  Orientation to time - 5  Orientation to Place - 5  Registration - 3  Attention/ Calculation - 5  Recall - 2  Language- name 2 objects - 2  Language- repeat - 1  Language- follow 3 step command - 3  Language- read & follow direction - 1  Write a sentence - 1  Copy design - 1  Total score - 29        Immunization History  Administered Date(s) Administered  . Influenza Split 01/15/2013, 01/28/2014  . Influenza Whole 01/16/2011, 01/06/2012, 11/16/2015  . Influenza, High Dose Seasonal PF 12/02/2016  . Influenza,inj,Quad PF,6+ Mos 11/18/2014  . Influenza-Unspecified 10/31/2017  . Pneumococcal Conjugate-13 07/22/2014  . Pneumococcal Polysaccharide-23 04/15/2008, 06/07/2016  . Tdap 06/08/2011   Screening Tests Health Maintenance  Topic Date Due  . INFLUENZA VACCINE   09/16/2018  . TETANUS/TDAP  06/07/2021  . PNA vac Low Risk Adult  Completed       Plan:     Reviewed health maintenance screenings with patient today and relevant education, vaccines, and/or referrals were provided.   Continue to eat heart healthy diet (full of fruits, vegetables, whole grains, lean protein, water--limit salt, fat, and sugar intake) and increase physical activity as tolerated.  Continue doing brain stimulating activities (puzzles, reading, adult coloring books, staying active) to keep memory sharp.   I have personally reviewed and noted the following in the patient's chart:   . Medical and social history . Use of alcohol, tobacco or illicit drugs  . Current medications and supplements . Functional ability and status . Nutritional status . Physical activity . Advanced directives . List of other physicians . Screenings to include cognitive, depression, and falls . Referrals and appointments  In addition, I have reviewed and discussed with patient certain preventive protocols, quality metrics, and best practice recommendations. A written personalized care plan for preventive services as well as general preventive health recommendations were provided to patient.     Michiel Cowboy, RN  07/11/2018  Medical screening examination/treatment/procedure(s) were performed by non-physician practitioner and as supervising physician I was immediately available for consultation/collaboration. I agree with above. Lew Dawes, MD

## 2018-07-12 ENCOUNTER — Ambulatory Visit (INDEPENDENT_AMBULATORY_CARE_PROVIDER_SITE_OTHER): Payer: Medicare Other | Admitting: *Deleted

## 2018-07-12 DIAGNOSIS — Z0000 Encounter for general adult medical examination without abnormal findings: Secondary | ICD-10-CM | POA: Diagnosis not present

## 2018-08-21 DIAGNOSIS — Z961 Presence of intraocular lens: Secondary | ICD-10-CM | POA: Diagnosis not present

## 2018-08-21 DIAGNOSIS — H5213 Myopia, bilateral: Secondary | ICD-10-CM | POA: Diagnosis not present

## 2018-08-21 DIAGNOSIS — H524 Presbyopia: Secondary | ICD-10-CM | POA: Diagnosis not present

## 2018-08-21 DIAGNOSIS — H52202 Unspecified astigmatism, left eye: Secondary | ICD-10-CM | POA: Diagnosis not present

## 2018-10-12 ENCOUNTER — Ambulatory Visit (INDEPENDENT_AMBULATORY_CARE_PROVIDER_SITE_OTHER): Payer: Medicare Other | Admitting: Internal Medicine

## 2018-10-12 ENCOUNTER — Other Ambulatory Visit (INDEPENDENT_AMBULATORY_CARE_PROVIDER_SITE_OTHER): Payer: Medicare Other

## 2018-10-12 ENCOUNTER — Other Ambulatory Visit: Payer: Self-pay

## 2018-10-12 ENCOUNTER — Encounter: Payer: Self-pay | Admitting: Internal Medicine

## 2018-10-12 VITALS — BP 140/70 | HR 82 | Temp 98.3°F | Ht 66.0 in | Wt 148.0 lb

## 2018-10-12 DIAGNOSIS — F329 Major depressive disorder, single episode, unspecified: Secondary | ICD-10-CM

## 2018-10-12 DIAGNOSIS — N320 Bladder-neck obstruction: Secondary | ICD-10-CM

## 2018-10-12 DIAGNOSIS — Z23 Encounter for immunization: Secondary | ICD-10-CM

## 2018-10-12 DIAGNOSIS — E785 Hyperlipidemia, unspecified: Secondary | ICD-10-CM

## 2018-10-12 DIAGNOSIS — R197 Diarrhea, unspecified: Secondary | ICD-10-CM | POA: Diagnosis not present

## 2018-10-12 DIAGNOSIS — F32A Depression, unspecified: Secondary | ICD-10-CM

## 2018-10-12 LAB — BASIC METABOLIC PANEL
BUN: 29 mg/dL — ABNORMAL HIGH (ref 6–23)
CO2: 27 mEq/L (ref 19–32)
Calcium: 10 mg/dL (ref 8.4–10.5)
Chloride: 102 mEq/L (ref 96–112)
Creatinine, Ser: 1.19 mg/dL (ref 0.40–1.50)
GFR: 59.18 mL/min — ABNORMAL LOW (ref >60.00)
Glucose, Bld: 92 mg/dL (ref 70–99)
Potassium: 4.3 mEq/L (ref 3.5–5.1)
Sodium: 139 mEq/L (ref 135–145)

## 2018-10-12 LAB — CBC WITH DIFFERENTIAL/PLATELET
Basophils Absolute: 0 10*3/uL (ref 0.0–0.1)
Basophils Relative: 0.6 % (ref 0.0–3.0)
Eosinophils Absolute: 0.2 10*3/uL (ref 0.0–0.7)
Eosinophils Relative: 3.5 % (ref 0.0–5.0)
HCT: 37.4 % — ABNORMAL LOW (ref 39.0–52.0)
Hemoglobin: 13 g/dL (ref 13.0–17.0)
Lymphocytes Relative: 30.7 % (ref 12.0–46.0)
Lymphs Abs: 2 10*3/uL (ref 0.7–4.0)
MCHC: 34.9 g/dL (ref 30.0–36.0)
MCV: 90.6 fl (ref 78.0–100.0)
Monocytes Absolute: 0.7 10*3/uL (ref 0.1–1.0)
Monocytes Relative: 10.5 % (ref 3.0–12.0)
Neutro Abs: 3.6 10*3/uL (ref 1.4–7.7)
Neutrophils Relative %: 54.7 % (ref 43.0–77.0)
Platelets: 362 10*3/uL (ref 150.0–400.0)
RBC: 4.13 Mil/uL — ABNORMAL LOW (ref 4.22–5.81)
RDW: 13 % (ref 11.5–15.5)
WBC: 6.5 10*3/uL (ref 4.0–10.5)

## 2018-10-12 LAB — HEPATIC FUNCTION PANEL
ALT: 18 U/L (ref 0–53)
AST: 17 U/L (ref 0–37)
Albumin: 4.7 g/dL (ref 3.5–5.2)
Alkaline Phosphatase: 80 U/L (ref 39–117)
Bilirubin, Direct: 0.1 mg/dL (ref 0.0–0.3)
Total Bilirubin: 0.3 mg/dL (ref 0.2–1.2)
Total Protein: 7.4 g/dL (ref 6.0–8.3)

## 2018-10-12 LAB — URINALYSIS
Bilirubin Urine: NEGATIVE
Hgb urine dipstick: NEGATIVE
Ketones, ur: NEGATIVE
Leukocytes,Ua: NEGATIVE
Nitrite: NEGATIVE
Specific Gravity, Urine: 1.02 (ref 1.000–1.030)
Total Protein, Urine: NEGATIVE
Urine Glucose: NEGATIVE
Urobilinogen, UA: 0.2 (ref 0.0–1.0)
pH: 5.5 (ref 5.0–8.0)

## 2018-10-12 LAB — TSH: TSH: 2.86 u[IU]/mL (ref 0.35–4.50)

## 2018-10-12 LAB — LIPID PANEL
Cholesterol: 244 mg/dL — ABNORMAL HIGH (ref 0–200)
HDL: 58.6 mg/dL (ref >39.00)
NonHDL: 185.43
Total CHOL/HDL Ratio: 4
Triglycerides: 294 mg/dL — ABNORMAL HIGH (ref 0.0–149.0)
VLDL: 58.8 mg/dL — ABNORMAL HIGH (ref 0.0–40.0)

## 2018-10-12 LAB — PSA: PSA: 0.68 ng/mL (ref 0.10–4.00)

## 2018-10-12 LAB — LDL CHOLESTEROL, DIRECT: Direct LDL: 152 mg/dL

## 2018-10-12 NOTE — Assessment & Plan Note (Signed)
Valerian root 

## 2018-10-12 NOTE — Progress Notes (Signed)
Subjective:  Patient ID: [name] Browning, male    DOB: [birth year]  Age: 78 y.o. MRN: [id number]  CC: No chief complaint on file.   HPI [name] presents for dyslipidemia, HTN off Rx, BPH  Outpatient Medications Prior to Visit  Medication Sig Dispense Refill  . Cholecalciferol (VITAMIN D3) 2000 units capsule Take 1 capsule (2,000 Units total) by mouth daily. 100 capsule 3  . cyanocobalamin 1000 MCG tablet Take 1,000 mcg by mouth daily.    [name] ezetimibe (ZETIA) 10 MG tablet Take 1 tablet (10 mg total) by mouth daily. 90 tablet 3  . MEGARED OMEGA-3 KRILL OIL 500 MG CAPS Take 1 capsule by mouth every morning. 100 capsule 3  . Multiple Vitamin tablet Take 1 tablet by mouth daily.    [name] RAPAFLO 8 MG CAPS capsule Take 8 mg by mouth daily with breakfast.     . azithromycin (ZITHROMAX Z-PAK) 250 MG tablet As directed (Patient not taking: Reported on 10/12/2018) 6 tablet 0   No facility-administered medications prior to visit.     ROS: Review of Systems  Constitutional: Negative for appetite change, fatigue and unexpected weight change.  HENT: Negative for congestion, nosebleeds, sneezing, sore throat and trouble swallowing.   Eyes: Negative for itching and visual disturbance.  Respiratory: Negative for cough.   Cardiovascular: Negative for chest pain, palpitations and leg swelling.  Gastrointestinal: Negative for abdominal distention, blood in stool, diarrhea and nausea.  Genitourinary: Negative for frequency and hematuria.  Musculoskeletal: Negative for back pain, gait problem, joint swelling and neck pain.  Skin: Negative for rash.  Neurological: Negative for dizziness, tremors, speech difficulty and weakness.  Psychiatric/Behavioral: Negative for agitation, dysphoric mood, sleep disturbance and suicidal ideas. The patient is not nervous/anxious.     Objective:  BP 140/70   Pulse 82   Temp 98.3 F (36.8 C) (Oral)   Ht 5\' 6"  (1.676 m)   Wt 148 lb (67.1 kg)   SpO2 96%    BMI 23.89 kg/m   BP Readings from Last 3 Encounters:  10/12/18 140/70  10/10/17 (!) 142/78  05/25/17 134/72    Wt Readings from Last 3 Encounters:  10/12/18 148 lb (67.1 kg)  10/10/17 147 lb (66.7 kg)  05/25/17 149 lb (67.6 kg)    Physical Exam Constitutional:      General: He is not in acute distress.    Appearance: He is well-developed.     Comments: NAD  Eyes:     Conjunctiva/sclera: Conjunctivae normal.     Pupils: Pupils are equal, round, and reactive to light.  Neck:     Musculoskeletal: Normal range of motion.     Thyroid: No thyromegaly.     Vascular: No JVD.  Cardiovascular:     Rate and Rhythm: Normal rate and regular rhythm.     Heart sounds: Normal heart sounds. No murmur. No friction rub. No gallop.   Pulmonary:     Effort: Pulmonary effort is normal. No respiratory distress.     Breath sounds: Normal breath sounds. No wheezing or rales.  Chest:     Chest wall: No tenderness.  Abdominal:     General: Bowel sounds are normal. There is no distension.     Palpations: Abdomen is soft. There is no mass.     Tenderness: There is no abdominal tenderness. There is no guarding or rebound.  Musculoskeletal: Normal range of motion.        General: No tenderness.  Lymphadenopathy:  Cervical: No cervical adenopathy.  Skin:    General: Skin is warm and dry.     Findings: No rash.  Neurological:     Mental Status: He is alert and oriented to person, place, and time.     Cranial Nerves: No cranial nerve deficit.     Motor: No abnormal muscle tone.     Coordination: Coordination normal.     Gait: Gait normal.     Deep Tendon Reflexes: Reflexes are normal and symmetric.  Psychiatric:        Behavior: Behavior normal.        Thought Content: Thought content normal.        Judgment: Judgment normal.    Rectal - per Urology  Lab Results  Component Value Date   WBC 5.4 10/11/2017   HGB 13.5 10/11/2017   HCT 39.1 10/11/2017   PLT 323.0 10/11/2017   GLUCOSE  102 (H) 10/11/2017   CHOL 238 (H) 10/11/2017   TRIG 132.0 10/11/2017   HDL 61.00 10/11/2017   LDLCALC 151 (H) 10/11/2017   ALT 18 10/11/2017   AST 16 10/11/2017   NA 140 10/11/2017   K 4.3 10/11/2017   CL 103 10/11/2017   CREATININE 1.15 10/11/2017   BUN 24 (H) 10/11/2017   CO2 30 10/11/2017   TSH 2.32 10/11/2017   PSA 0.49 04/15/2008   HGBA1C 5.8 (H) 05/19/2016    Mr Brain Wo Contrast  Result Date: 06/02/2016 [facility] NEUROIMAGING REPORT STUDY DATE: 06/01/16 PATIENT NAME: SEVASTIAN ZAHRA DOB: 10/19/1940 MRN: [id number] ORDERING CLINICIAN: Andrey Spearman, MD CLINICAL HISTORY: 78 year old male with memory loss. EXAM: MRI brain (without) TECHNIQUE: MRI of the brain without contrast was obtained utilizing 5 mm axial slices with T1, T2, T2 flair, SWI and diffusion weighted views.  T1 sagittal and T2 coronal views were obtained. CONTRAST: no COMPARISON: none IMAGING SITE: [facility] [address] (1.5 Tesla MRI)  FINDINGS: No abnormal lesions are seen on diffusion-weighted views to suggest acute ischemia. The cortical sulci, fissures and cisterns are notable for mild diffuse and moderate mesial temporal atrophy. Lateral, third and fourth ventricle are normal in size and appearance. No extra-axial fluid collections are seen. No evidence of mass effect or midline shift.  Scattered subcortical, periventricular chronic small vessel ischemic disease. On sagittal views the posterior fossa, pituitary gland and corpus callosum are unremarkable. No evidence of intracranial hemorrhage on SWI views. The orbits and their contents, paranasal sinuses and calvarium are notable for bilateral lens extractions and mild mucosal thickening in the frontal, maxillary, ethmoid and sphenoid sinuses.  Intracranial flow voids are present.   Abnormal MRI brain (without) demonstrating: 1. Mild diffuse and moderate mesial temporal atrophy. 2. Scattered subcortical, periventricular chronic  small vessel ischemic disease. 3. No acute findings. INTERPRETING PHYSICIAN: Penni Bombard, MD Certified in Neurology, Neurophysiology and Neuroimaging [facility] [address] [place], [place] [address] [phone]    Assessment & Plan:   Diagnoses and all orders for this visit:  Need for influenza vaccination -     Flu Vaccine QUAD High Dose(Fluad)     No orders of the defined types were placed in this encounter.    Follow-up: No follow-ups on file.  Walker Kehr, MD

## 2018-10-12 NOTE — Patient Instructions (Addendum)
Normal BP<130/85   Mediterranean diet is good for you. ([employer] has a typical Mediterranean cuisine menu) The Mediterranean diet is a way of eating based on the traditional cuisine of countries bordering the [employer]. While there is no single definition of the Mediterranean diet, it is typically high in vegetables, fruits, whole grains, beans, nut and seeds, and olive oil. The main components of Mediterranean diet include: [name] Daily consumption of vegetables, fruits, whole grains and healthy fats  . Weekly intake of fish, poultry, beans and eggs  . Moderate portions of dairy products  . Limited intake of red meat Other important elements of the Mediterranean diet are sharing meals with family and friends, enjoying a glass of red wine and being physically active. Health benefits of a Mediterranean diet: A traditional Mediterranean diet consisting of large quantities of fresh fruits and vegetables, nuts, fish and olive oil-coupled with physical activity-can reduce your risk of serious mental and physical health problems by: Preventing heart disease and strokes. Following a Mediterranean diet limits your intake of refined breads, processed foods, and red meat, and encourages drinking red wine instead of hard liquor-all factors that can help prevent heart disease and stroke. Keeping you agile. If you're an older adult, the nutrients gained with a Mediterranean diet may reduce your risk of developing muscle weakness and other signs of frailty by about 70 percent. Reducing the risk of Alzheimer's. Research suggests that the [place] diet may improve cholesterol, blood sugar levels, and overall blood vessel health, which in turn may reduce your risk of Alzheimer's disease or dementia. Halving the risk of Parkinson's disease. The high levels of antioxidants in the Mediterranean diet can prevent cells from undergoing a damaging process called oxidative stress, thereby cutting the risk of  Parkinson's disease in half. Increasing longevity. By reducing your risk of developing heart disease or cancer with the Mediterranean diet, you're reducing your risk of death at any age by 20%. Protecting against type 2 diabetes. A Mediterranean diet is rich in fiber which digests slowly, prevents huge swings in blood sugar, and can help you maintain a healthy weight.    Medical screening examination/treatment/procedure(s) were performed by non-physician practitioner and as supervising physician I was immediately available for consultation/collaboration. I agree with above. Lew Dawes, MD

## 2018-10-30 DIAGNOSIS — L570 Actinic keratosis: Secondary | ICD-10-CM | POA: Diagnosis not present

## 2018-10-30 DIAGNOSIS — L820 Inflamed seborrheic keratosis: Secondary | ICD-10-CM | POA: Diagnosis not present

## 2018-10-30 DIAGNOSIS — L812 Freckles: Secondary | ICD-10-CM | POA: Diagnosis not present

## 2018-10-30 DIAGNOSIS — Z85828 Personal history of other malignant neoplasm of skin: Secondary | ICD-10-CM | POA: Diagnosis not present

## 2018-10-30 DIAGNOSIS — D1801 Hemangioma of skin and subcutaneous tissue: Secondary | ICD-10-CM | POA: Diagnosis not present

## 2018-10-30 DIAGNOSIS — L821 Other seborrheic keratosis: Secondary | ICD-10-CM | POA: Diagnosis not present

## 2018-10-30 DIAGNOSIS — B078 Other viral warts: Secondary | ICD-10-CM | POA: Diagnosis not present

## 2018-11-20 DIAGNOSIS — E291 Testicular hypofunction: Secondary | ICD-10-CM | POA: Diagnosis not present

## 2018-11-20 DIAGNOSIS — N400 Enlarged prostate without lower urinary tract symptoms: Secondary | ICD-10-CM | POA: Diagnosis not present

## 2018-11-20 DIAGNOSIS — Z125 Encounter for screening for malignant neoplasm of prostate: Secondary | ICD-10-CM | POA: Diagnosis not present

## 2018-12-04 ENCOUNTER — Ambulatory Visit (INDEPENDENT_AMBULATORY_CARE_PROVIDER_SITE_OTHER)
Admission: RE | Admit: 2018-12-04 | Discharge: 2018-12-04 | Disposition: A | Discharge status: Home / Self Care | Payer: Self-pay | Source: Ambulatory Visit | Attending: Internal Medicine | Admitting: Internal Medicine

## 2018-12-04 ENCOUNTER — Other Ambulatory Visit: Payer: Self-pay

## 2018-12-04 DIAGNOSIS — E785 Hyperlipidemia, unspecified: Secondary | ICD-10-CM

## 2018-12-08 ENCOUNTER — Telehealth: Payer: Self-pay | Admitting: Internal Medicine

## 2018-12-08 NOTE — Telephone Encounter (Signed)
I spoke to the patient's wife who spoke to the patient's PCP's office and they informed her that he is out of town, but will return Monday and will call them with results.  She was thankful for the call.

## 2018-12-08 NOTE — Telephone Encounter (Signed)
Wife of patient wanted to know when the results of the scan would be ready. All she can see on the MyChart was the AVS, but the results are not there.   She did mention that  Dr. Alain Marion was the provider that ordered the test, and she called his office already too. She thought since we did the test, she may get results from [place]. The patient is getting anxious

## 2018-12-12 ENCOUNTER — Other Ambulatory Visit: Payer: Self-pay | Admitting: Internal Medicine

## 2018-12-12 ENCOUNTER — Telehealth: Payer: Self-pay | Admitting: Cardiology

## 2018-12-12 ENCOUNTER — Telehealth: Payer: Self-pay

## 2018-12-12 DIAGNOSIS — Z8679 Personal history of other diseases of the circulatory system: Secondary | ICD-10-CM

## 2018-12-12 DIAGNOSIS — R931 Abnormal findings on diagnostic imaging of heart and coronary circulation: Secondary | ICD-10-CM

## 2018-12-12 MED ORDER — ASPIRIN EC 81 MG PO TBEC: 81.0000 mg | DELAYED_RELEASE_TABLET | Freq: Every day | ORAL | 3 refills | Status: AC

## 2018-12-12 NOTE — Telephone Encounter (Signed)
° ° °  Spouse calling, would like to know if patient should be taking aspirin.

## 2018-12-12 NOTE — Telephone Encounter (Signed)
LMTCB

## 2018-12-12 NOTE — Telephone Encounter (Signed)
Copied from [place] [id number]. Topic: General - Inquiry >> Dec 12, 2018  9:54 AM Lennox Solders wrote: Reason for CRM: pt wife is calling and would like to speak with a nurse concerning heart test the patient had done. Pt wife did not want elaborate with me

## 2018-12-12 NOTE — Telephone Encounter (Signed)
referral placed to cardiology per Dr Alain Marion notes

## 2018-12-13 NOTE — Telephone Encounter (Signed)
I spoke to the patient's wife [name] Browning) and informed her that it looked like Aspirin 81 mg was started on 10/27 according to the medication list.  She verbalized understanding.

## 2018-12-28 ENCOUNTER — Other Ambulatory Visit: Payer: Self-pay | Admitting: Internal Medicine

## 2019-01-01 ENCOUNTER — Encounter (INDEPENDENT_AMBULATORY_CARE_PROVIDER_SITE_OTHER): Payer: Self-pay | Admitting: Otolaryngology

## 2019-01-01 ENCOUNTER — Other Ambulatory Visit: Payer: Self-pay

## 2019-01-01 ENCOUNTER — Ambulatory Visit (INDEPENDENT_AMBULATORY_CARE_PROVIDER_SITE_OTHER): Payer: Medicare Other | Admitting: Otolaryngology

## 2019-01-01 VITALS — Temp 97.2°F

## 2019-01-01 DIAGNOSIS — H6121 Impacted cerumen, right ear: Secondary | ICD-10-CM | POA: Diagnosis not present

## 2019-01-01 DIAGNOSIS — H9201 Otalgia, right ear: Secondary | ICD-10-CM

## 2019-01-01 NOTE — Progress Notes (Signed)
HPI: [name] Browning is a 78 y.o. male who presents with his wife for evaluation of intermittent right ear pain. Patient states that for the last week, he has an intermittent right ear "twinge" that lasts seconds. He also endorses some right-sided scalp tenderness over this time. Patient has had this one time before and it went away on its own. He denies any hearing changes, ear drainage. He denies nasal congestion, URI symptoms, throat pain, fever. He has tried no medications for this. He states nothing seems to make it worse although he thinks he feels it more at night than in the morning. No further symptoms today.  Past Medical History:  Diagnosis Date  . ADD (attention deficit disorder)   . Anxiety   . Asthma    AS A CHILD  . Atrial flutter ([place])    a. s/p RFCA 07/2011.  Xarelto d/c 08/2011.  . Cataracts, bilateral   . Conjunctivitis 09/14/2013  . Depression   . DJD (degenerative joint disease) of knee   . Dysrhythmia    hx a-flutter with successful ablation 2013  . GERD (gastroesophageal reflux disease)   . Heart murmur    a. 06/2011 Echo: 65-70%, mild MR  . Hyperlipidemia   . S/P cardiac cath    a. in 1990's - Dr. Gwenlyn Found - WNL  . Sleep apnea    could not tolerate cpap, uses mouth guard now   Past Surgical History:  Procedure Laterality Date  . ATRIAL FLUTTER ABLATION N/A 08/06/2011   Procedure: ATRIAL FLUTTER ABLATION;  Surgeon: Deboraha Sprang, MD;  [facility] C[facility] LAB;  Service: Cardiovascular;  Laterality: N/A;  . COLONOSCOPY    . LESION REMOVAL N/A 08/02/2014   Procedure: EXCISION UPPER LIP VASCULAR ANOMALY ;  Surgeon: Irene Limbo, MD;  Location: [place];  Service: Plastics;  Laterality: N/A;  . MASTOIDECTOMY    . POLYPECTOMY    . TONSILLECTOMY    . WRIST FRACTURE SURGERY  2007   left   Social History   Socioeconomic History  . Marital status: Married    Spouse name: Not on file  . Number of children: Not on file  . Years of education: Not on  file  . Highest education level: Not on file  Occupational History  . Occupation: RetiredWater quality scientist for [employer]  Social Needs  . Financial resource strain: Not hard at all  . Food insecurity    Worry: Never true    Inability: Never true  . Transportation needs    Medical: No    Non-medical: No  Tobacco Use  . Smoking status: Former Smoker    Packs/day: 3.00    Years: 7.00    Pack years: 21.00    Types: Cigarettes    Start date: 1963    Quit date: 02/16/1968    Years since quitting: 50.9  . Smokeless tobacco: Never Used  Substance and Sexual Activity  . Alcohol use: No  . Drug use: No  . Sexual activity: Yes  Lifestyle  . Physical activity    Days per week: 5 days    Minutes per session: 60 min  . Stress: Only a little  Relationships  . Social connections    Talks on phone: More than three times a week    Gets together: More than three times a week    Attends religious service: More than 4 times per year    Active member of club or organization: Yes  Attends meetings of clubs or organizations: More than 4 times per year    Relationship status: Married  Other Topics Concern  . Not on file  Social History Narrative  . Not on file   Family History  Problem Relation Age of Onset  . Heart disease Mother   . Leukemia Mother   . Prostate cancer Father   . Stroke Father   . Hypertension Brother   . Prostate cancer Brother   . Colon cancer Neg Hx   . Stomach cancer Neg Hx   . Heart attack Neg Hx    Allergies  Allergen Reactions  . Simvastatin     Memory issues  . Trintellix [Vortioxetine]     Memory loss   Prior to Admission medications   Medication Sig Start Date End Date Taking? Authorizing Provider  aspirin EC 81 MG tablet Take 1 tablet (81 mg total) by mouth daily. 12/12/18 12/12/19 Yes Plotnikov, Evie Lacks, MD  Cholecalciferol (VITAMIN D3) 2000 units capsule Take 1 capsule (2,000 Units total) by mouth daily. 10/04/16  Yes Plotnikov, Evie Lacks, MD   cyanocobalamin 1000 MCG tablet Take 1,000 mcg by mouth daily.   Yes [provider]  ezetimibe (ZETIA) 10 MG tablet TAKE 1 TABLET BY MOUTH EVERY DAY 12/28/18  Yes Plotnikov, Evie Lacks, MD  MEGARED OMEGA-3 KRILL OIL 500 MG CAPS Take 1 capsule by mouth every morning. 10/04/16  Yes Plotnikov, Evie Lacks, MD  Multiple Vitamin tablet Take 1 tablet by mouth daily.   Yes [provider]  RAPAFLO 8 MG CAPS capsule Take 8 mg by mouth daily with breakfast.  08/24/13  Yes [provider]     Positive ROS: positive for ear pain, otherwise negative.  All other systems have been reviewed and were otherwise negative with the exception of those mentioned in the HPI and as above.  Physical Exam: Constitutional: Alert, well-appearing, no acute distress Ears: External ears without lesions or tenderness. Ear canals are mildly obstructed with wax, R>L which was removed revealing intact, clear TMs. Tuning forks reveal symmetric hearing bilaterally. Nasal: External nose without lesions. Septum midline. Clear nasal passages Oral: Lips and gums without lesions. Tongue and palate mucosa without lesions. Posterior oropharynx clear. Neck: No palpable adenopathy or masses Respiratory: Breathing comfortably  Skin: No scalp, facial/neck lesions, lumps or rash noted.  Cerumen impaction removal  Date/Time: 01/01/2019 3:58 PM Performed by: Rozetta Nunnery, MD Authorized by: Rozetta Nunnery, MD   Consent:    Consent obtained:  Verbal   Consent given by:  Patient   Risks discussed:  Pain and bleeding Procedure details:    Location:  R ear   Procedure type: curette   Post-procedure details:    Inspection:  TM intact and canal normal   Hearing quality:  Normal   Patient tolerance of procedure:  Tolerated well, no immediate complications    Assessment: Right sided otalgia with normal ear, nasal and oral exam Cerumen impaction, right ear  Plan: Re-assured patient regarding  exam today. No further therapy needed. Recommended 5 day course of Ibuprofen. He will return PRN.  Christin Hoffstadt, PA-C  I have personally seen and examined this patient. I agree with the assessment and plan as outlined above. Radene Journey, MD

## 2019-01-26 ENCOUNTER — Telehealth: Payer: Self-pay | Admitting: Cardiology

## 2019-01-26 NOTE — Telephone Encounter (Signed)
ew Message:    Wife said she will need to come with pt for his appt on Wednesday with Dr Radford Pax. She says he can not hear.

## 2019-01-26 NOTE — Telephone Encounter (Signed)
Noted, will put note in appointment notes. Will forward to Dr. Theodosia Blender nurse.

## 2019-01-31 ENCOUNTER — Encounter: Payer: Self-pay | Admitting: Cardiology

## 2019-01-31 ENCOUNTER — Ambulatory Visit (INDEPENDENT_AMBULATORY_CARE_PROVIDER_SITE_OTHER): Payer: Medicare Other | Admitting: Cardiology

## 2019-01-31 ENCOUNTER — Other Ambulatory Visit: Payer: Self-pay

## 2019-01-31 VITALS — BP 142/78 | HR 69 | Ht 66.0 in | Wt 152.8 lb

## 2019-01-31 DIAGNOSIS — I2510 Atherosclerotic heart disease of native coronary artery without angina pectoris: Secondary | ICD-10-CM

## 2019-01-31 DIAGNOSIS — R9431 Abnormal electrocardiogram [ECG] [EKG]: Secondary | ICD-10-CM

## 2019-01-31 DIAGNOSIS — E785 Hyperlipidemia, unspecified: Secondary | ICD-10-CM | POA: Diagnosis not present

## 2019-01-31 DIAGNOSIS — I10 Essential (primary) hypertension: Secondary | ICD-10-CM | POA: Diagnosis not present

## 2019-01-31 NOTE — Progress Notes (Signed)
Cardiology Consult  Note    Date:  01/31/2019   ID:  JASSIR STRAWDERMAN, DOB 17-Mar-1940, MRN [id number]  PCP:  Cassandria Anger, MD  Cardiologist:  Fransico Him, MD   Chief Complaint  Patient presents with  . New Patient (Initial Visit)    coronary artery calcium    History of Present Illness:  DEBRA WARMOTH is a 78 y.o. male who is being seen today for the evaluation of coronary artery calcium at the request of Plotnikov, Evie Lacks, MD.  This is a 78yo male with a hx of asthma, remote aflutter s/p RFCA 2013, HTN and HLD.  He had a heart cath done in 1990's that was normal.  He recently was seen for routine PE and a coronary calcium score was ordered.  This showed an elevated Ca score of 273.  He is now referred to Cardiology for evaluation.   He denies any chest pain or pressure, SOB, DOE, PND, orthopnea, LE edema, dizziness, palpitations or syncope. He plays golf 3 times weekly without any problems.  He has a fm hx of CAD in his mom but later age in life.  He was exposed to second hand smoke as a child and then smoked until he was 42yo.  He is compliant with he meds and is tolerating meds with no SE.    Past Medical History:  Diagnosis Date  . Acute bronchitis 08/11/2016   6/18  H/o asthma as a child...  . ADD (attention deficit disorder)   . Anemia in chronic illness 06/15/2013   Followed as Primary Care Patient/ [facility]/ [place] - Fe studies ok 06/15/13 > stool cards received 06/27/2013 > neg x 5> down to 11.4 08/23/13  > referred for hematology 09/04/2013> Alvy Bimler felt it was due to low Testosterone  Resolved as of 08/14/2015        . Anxiety   . Asthma    AS A CHILD  . Ataxia 06/07/2016   2018 poss age related/meds  . Atrial flutter ([place])    a. s/p RFCA 07/2011.  Xarelto d/c 08/2011.  . Bladder neck obstruction 10/04/2016   Dr Karsten Ro Rapaflo  . Cataracts, bilateral   . Colon polyps 10/04/2016   Dr Ardis Hughs Due colon 02/2019  . Conjunctivitis 09/14/2013  . Cough 02/19/2014    DgEs   01/23/16 Unremarkable esophagram. Allergy profile 04/05/2016 >  Eos 0.4 /  IgE  284  RAST pos grass/trees/dust > rec allergy eval   . Depression   . Diarrhea 02/11/2017  . Disturbance in sleep behavior 04/30/2010    Trazodone  Lorazepam prn  . DIZZINESS 01/24/2008   Qualifier: Diagnosis of  By: Royal Piedra NP, Tammy    . DJD (degenerative joint disease) of knee   . Drug-induced memory loss ([place]) 02/22/2014   D/c statin 01/2014 > improved  D/c Trintellix Dr Tish Frederickson  . Dyspnea 06/18/2011  . Dysrhythmia    hx a-flutter with successful ablation 2013  . Essential hypertension 06/18/2011   NAS diet ASA - unable to use due to h/o AVM bleeding - colon 2003  . FATIGUE, ACUTE 03/23/2007   Qualifier: Diagnosis of  By: Melvyn Novas MD, Christena Deem   . GERD (gastroesophageal reflux disease)   . Heart murmur    a. 06/2011 Echo: 65-70%, mild MR  . History of lower GI bleeding 09/26/2012   Followed in GI clinic/ [facility]/ Sharlett Iles  - colonoscopy 05/22/2001  And 06/14/11 > c/w hemorrhoids and cecal avm   .  Hyperlipidemia   . Hyperlipidemia LDL goal <130 03/22/2007   Followed as Primary Care Patient/ [facility]/ [facility]   Zetia   . S/P cardiac cath    a. in 1990's - Dr. Gwenlyn Found - WNL  . Scalp laceration 11/20/2014   11/01/14 > staples removed 11/18/2014    . Sleep apnea    could not tolerate cpap, uses mouth guard now  . TESTICULAR HYPOFUNCTION 09/15/2009   F/u urology on testosterone gel      Past Surgical History:  Procedure Laterality Date  . ATRIAL FLUTTER ABLATION N/A 08/06/2011   Procedure: ATRIAL FLUTTER ABLATION;  Surgeon: Deboraha Sprang, MD;  Location[facility] CATH LAB;  Service: Cardiovascular;  Laterality: N/A;  . COLONOSCOPY    . LESION REMOVAL N/A 08/02/2014   Procedure: EXCISION UPPER LIP VASCULAR ANOMALY ;  Surgeon: Irene Limbo, MD;  Location: [place];  Service: Plastics;  Laterality: N/A;  . MASTOIDECTOMY    . POLYPECTOMY    . TONSILLECTOMY    . WRIST FRACTURE SURGERY   2007   left    Current Medications: Current Meds  Medication Sig  . aspirin EC 81 MG tablet Take 1 tablet (81 mg total) by mouth daily.  . Cholecalciferol (VITAMIN D3) 2000 units capsule Take 1 capsule (2,000 Units total) by mouth daily.  . cyanocobalamin 1000 MCG tablet Take 1,000 mcg by mouth daily.  [name] ezetimibe (ZETIA) 10 MG tablet TAKE 1 TABLET BY MOUTH EVERY DAY  . Multiple Vitamin tablet Take 1 tablet by mouth daily.  . Multiple Vitamins-Minerals (ZINC PO) Take 1 tablet by mouth daily.  [name] RAPAFLO 8 MG CAPS capsule Take 8 mg by mouth daily with breakfast.   . ST JOHNS WORT PO Take 1 tablet by mouth daily.  [name] VALERIAN PO Take 1 tablet by mouth daily.  . vitamin E 400 UNIT capsule Take 400 Units by mouth daily.    Allergies:   Simvastatin and Trintellix [vortioxetine]   Social History   Socioeconomic History  . Marital status: Married    Spouse name: Not on file  . Number of children: Not on file  . Years of education: Not on file  . Highest education level: Not on file  Occupational History  . 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 Smoking status: Former Smoker    Packs/day: 3.00    Years: 7.00    Pack years: 21.00    Types: Cigarettes    Start date: 1963    Quit date: 02/16/1968    Years since quitting: 50.9  . Smokeless tobacco: Never Used  Substance and Sexual Activity  . Alcohol use: No  . Drug use: No  . Sexual activity: Yes  Other Topics Concern  . Not on file  Social History Narrative  . Not on file   Social Determinants of Health   Financial Resource Strain:   . Difficulty of Paying Living Expenses: Not on file  Food Insecurity:   . Worried About Charity fundraiser in the Last Year: Not on file  . Ran Out of Food in the Last Year: Not on file  Transportation Needs:   . Lack of Transportation (Medical): Not on file  . Lack of Transportation (Non-Medical): Not on file  Physical Activity: Unknown  . Days of Exercise per  Week: 5 days  . Minutes of Exercise per Session: Not on file  Stress:   . Feeling of Stress : Not on file  Social Connections:   . Frequency of Communication with Friends and Family: Not on file  . Frequency of Social Gatherings with Friends and Family: Not on file  . Attends Religious Services: Not on file  . Active Member of Clubs or Organizations: Not on file  . Attends Archivist Meetings: Not on file  . Marital Status: Not on file     Family History:  The patient's family history includes Heart disease in his mother; Hypertension in his brother; Leukemia in his mother; Prostate cancer in his brother and father; Stroke in his father.   ROS:   Please see the history of present illness.    ROS All other systems reviewed and are negative.  No flowsheet data found.     PHYSICAL EXAM:   VS:  BP (!) 142/78   Pulse 69   Ht 5\' 6"  (1.676 m)   Wt 152 lb 12.8 oz (69.3 kg)   SpO2 97%   BMI 24.66 kg/m    GEN: Well nourished, well developed, in no acute distress  HEENT: normal  Neck: no JVD, carotid bruits, or masses Cardiac: RRR; no murmurs, rubs, or gallops,no edema.  Intact distal pulses bilaterally.  Respiratory:  clear to auscultation bilaterally, normal work of breathing GI: soft, nontender, nondistended, + BS MS: no deformity or atrophy  Skin: warm and dry, no rash Neuro:  Alert and Oriented x 3, Strength and sensation are intact Psych: euthymic mood, full affect  Wt Readings from Last 3 Encounters:  01/31/19 152 lb 12.8 oz (69.3 kg)  10/12/18 148 lb (67.1 kg)  10/10/17 147 lb (66.7 kg)      Studies/Labs Reviewed:   EKG:  EKG is ordered today.  The ekg ordered today demonstrates NSR with LVH and T wave inversion in V3-V6 which are new.    Recent Labs: 10/12/2018: ALT 18; BUN 29; Creatinine, Ser 1.19; Hemoglobin 13.0; Platelets 362.0; Potassium 4.3; Sodium 139; TSH 2.86   Lipid Panel    Component Value Date/Time   CHOL 244 (H) 10/12/2018 1554   TRIG  294.0 (H) 10/12/2018 1554   HDL 58.60 10/12/2018 1554   CHOLHDL 4 10/12/2018 1554   VLDL 58.8 (H) 10/12/2018 1554   LDLCALC 151 (H) 10/11/2017 1031   LDLDIRECT 152.0 10/12/2018 1554    Additional studies/ records that were reviewed today include:  Office notes from PCP    ASSESSMENT:    1. Coronary artery calcification seen on CAT scan   2. Essential hypertension   3. Hyperlipidemia LDL goal <130   4. Abnormal electrocardiogram      PLAN:  In order of problems listed above:  1. Coronary artery calcifications -coronary Ca score was elevated at 273 which was 48th% for age and sex matched controls.   -he is completely asymptomatic -his EKG does show some T wave inversions in the lateral precordial leads but may be due to repol from LVH -He no longer smokes -he has been intolerant to simvastatin in the past but has not tried any other statin -LDL in the summer was 157 -I will get a Lexiscan myoview to rule out ischemia -needs aggressive risk factor modification -will refer to lipid clinic for elevated LDL and consider PSCK9i vs. Bempedoic acid -HbA1C as 5.8% in 2018 and recommend PCP repeat this. -continue ASA 81mg  daily  2.  HTN -BP controlled  3.  HLD -LDL was 152 in August -he did not tolerate simvastatin due to memory issues -given intolerance to statins, elevated LDL  that needs to be < 70 and coronary Ca, recommend referral to lipid clinic.   4.  Abnormal EKG -EKG showed LVH with possible repolarization abnormality -I will get a 2D echo to assess LVF and LVH   Medication Adjustments/Labs and Tests Ordered: Current medicines are reviewed at length with the patient today.  Concerns regarding medicines are outlined above.  Medication changes, Labs and Tests ordered today are listed in the Patient Instructions below.  Patient Instructions  Medication Instructions:  Your physician recommends that you continue on your current medications as directed. Please refer to  the Current Medication list given to you today.  *If you need a refill on your cardiac medications before your next appointment, please call your pharmacy*  Testing/Procedures: Your physician has requested that you have an echocardiogram. Echocardiography is a painless test that uses sound waves to create images of your heart. It provides your doctor with information about the size and shape of your heart and how well your heart's chambers and valves are working. This procedure takes approximately one hour. There are no restrictions for this procedure.  Your physician has requested that you have a lexiscan myoview.   Follow-Up: At [facility], you and your health needs are our priority.  As part of our continuing mission to provide you with exceptional heart care, we have created designated Provider Care Teams.  These Care Teams include your primary Cardiologist (physician) and Advanced Practice Providers (APPs -  Physician Assistants and Nurse Practitioners) who all work together to provide you with the care you need, when you need it.  Your next appointment:   1 year(s)  The format for your next appointment:   Either In Person or Virtual  Provider:   Fransico Him, MD  Other Instructions You have been referred to our lipid clinic for further management of your hyperlipidemia due to statin intolerance.      Signed, Fransico Him, MD  01/31/2019 11:07 AM    [address], [place], [place]  [address] Phone: [phone]; Fax: [phone]

## 2019-01-31 NOTE — Patient Instructions (Signed)
Medication Instructions:  Your physician recommends that you continue on your current medications as directed. Please refer to the Current Medication list given to you today.  *If you need a refill on your cardiac medications before your next appointment, please call your pharmacy*  Testing/Procedures: Your physician has requested that you have an echocardiogram. Echocardiography is a painless test that uses sound waves to create images of your heart. It provides your doctor with information about the size and shape of your heart and how well your heart's chambers and valves are working. This procedure takes approximately one hour. There are no restrictions for this procedure.  Your physician has requested that you have a lexiscan myoview.   Follow-Up: At [facility], you and your health needs are our priority.  As part of our continuing mission to provide you with exceptional heart care, we have created designated Provider Care Teams.  These Care Teams include your primary Cardiologist (physician) and Advanced Practice Providers (APPs -  Physician Assistants and Nurse Practitioners) who all work together to provide you with the care you need, when you need it.  Your next appointment:   1 year(s)  The format for your next appointment:   Either In Person or Virtual  Provider:   Fransico Him, MD  Other Instructions You have been referred to our lipid clinic for further management of your hyperlipidemia due to statin intolerance.

## 2019-02-05 ENCOUNTER — Other Ambulatory Visit: Payer: Self-pay

## 2019-02-05 ENCOUNTER — Telehealth: Payer: Self-pay

## 2019-02-05 ENCOUNTER — Ambulatory Visit (INDEPENDENT_AMBULATORY_CARE_PROVIDER_SITE_OTHER): Payer: Medicare Other

## 2019-02-05 DIAGNOSIS — I700 Atherosclerosis of aorta: Secondary | ICD-10-CM

## 2019-02-05 DIAGNOSIS — I2510 Atherosclerotic heart disease of native coronary artery without angina pectoris: Secondary | ICD-10-CM

## 2019-02-05 DIAGNOSIS — E785 Hyperlipidemia, unspecified: Secondary | ICD-10-CM

## 2019-02-05 MED ORDER — REPATHA SURECLICK 140 MG/ML ~~LOC~~ SOAJ: 1.0000 "pen " | SUBCUTANEOUS | 3 refills | Status: DC

## 2019-02-05 NOTE — Telephone Encounter (Signed)
Prior authorization approved for Repatha (through 08/06/19) and prescription sent to pharmacy. Pt's wife aware. She will begin his injections on 02/19/19 (when her next Repatha dose is due). Scheduled repeat lipid panel after the 4th injection. Will follow up with pt/pt's wife once results finalized.

## 2019-02-05 NOTE — Progress Notes (Signed)
Patient ID: [name] Browning                 DOB: [birth year]                    MRN: [id number]     HPI: MITT LAMASTER is a 78 y.o. male patient referred to lipid clinic by Dr. Radford Pax during last visit on 01/31/19. PMH is significant for aflutter, HTN, HLD, and aortic atherosclerosis with elevated calcium score of 273 (48th percentile for age and sex matched cohort). Most recent LDL from October 2020 was elevated at 152 on Zetia 10 mg daily. Allergy noted to simvastatin for memory issues but also has tried Crestor in the past per chart review.   Patient presents to lipid clinic accompanied by his wife for initial visit. Pt's wife explains that pt has tried simvastatin and Crestor in the past but was intolerant to both due to memory issues. She explains that pt was told not to try another statin due to these issues. The memory issues resolved once both medications were discontinued. Pt and wife eat a nutritional diet and pay close attention to nutrition labels on packaged foods. Pt exercises regularly.   Current Medications: Zetia 10 mg daily Intolerances: simvastatin (memory issues), Crestor (memory issues) Risk Factors: HTN, HLD, aortic atherosclerosis with elevated calcium score, family history LDL goal: <70 mg/dL  Diet: tries to limit salt, no fried foods or red meat, protein bar and fruit for breakfast, eats bacon once per week, plain pasta with olive oil and chicken, does not eat fast food  Exercise: plays golf and walks multiple days per week  Family History: Heart disease in his mother; Hypertension in his brother; Leukemia in his mother; Prostate cancer in his brother and father; Stroke in his father.   Social History: former smoker (quit 1970), no alcohol  Labs: 10/12/18: TC 244, TG 294, HDL 58, direct LDL 152 - Zetia 10 mg daily 10/11/17: TC 238, TG 132, HDL 61, LDL 151 - Zetia 10 mg daily 10/04/16: TC 263, TG 165, HDL 54, LDL 176 - none  Past Medical History:  Diagnosis Date    . Acute bronchitis 08/11/2016   6/18  H/o asthma as a child...  . ADD (attention deficit disorder)   . Anemia in chronic illness 06/15/2013   Followed as Primary Care Patient/ [facility]/ [place] - Fe studies ok 06/15/13 > stool cards received 06/27/2013 > neg x 5> down to 11.4 08/23/13  > referred for hematology 09/04/2013> Alvy Bimler felt it was due to low Testosterone  Resolved as of 08/14/2015        . Anxiety   . Asthma    AS A CHILD  . Ataxia 06/07/2016   2018 poss age related/meds  . Atrial flutter ([place])    a. s/p RFCA 07/2011.  Xarelto d/c 08/2011.  . Bladder neck obstruction 10/04/2016   Dr Karsten Ro Rapaflo  . Cataracts, bilateral   . Colon polyps 10/04/2016   Dr Ardis Hughs Due colon 02/2019  . Conjunctivitis 09/14/2013  . Cough 02/19/2014   DgEs   01/23/16 Unremarkable esophagram. Allergy profile 04/05/2016 >  Eos 0.4 /  IgE  284  RAST pos grass/trees/dust > rec allergy eval   . Depression   . Diarrhea 02/11/2017  . Disturbance in sleep behavior 04/30/2010    Trazodone  Lorazepam prn  . DIZZINESS 01/24/2008   Qualifier: Diagnosis of  By: Royal Piedra NP, Tammy    . DJD (degenerative joint disease)  of knee   . Drug-induced memory loss ([place]) 02/22/2014   D/c statin 01/2014 > improved  D/c Trintellix Dr Tish Frederickson  . Dyspnea 06/18/2011  . Dysrhythmia    hx a-flutter with successful ablation 2013  . Essential hypertension 06/18/2011   NAS diet ASA - unable to use due to h/o AVM bleeding - colon 2003  . FATIGUE, ACUTE 03/23/2007   Qualifier: Diagnosis of  By: Melvyn Novas MD, Christena Deem   . GERD (gastroesophageal reflux disease)   . Heart murmur    a. 06/2011 Echo: 65-70%, mild MR  . History of lower GI bleeding 09/26/2012   Followed in GI clinic/ [facility]/ [facility] [place]  - colonoscopy 05/22/2001  And 06/14/11 > c/w hemorrhoids and cecal avm   . Hyperlipidemia   . Hyperlipidemia LDL goal <130 03/22/2007   Followed as Primary Care Patient/ [facility]/ [place]   Zetia   . S/P cardiac cath    a. in 1990's - Dr.  Gwenlyn Found - WNL  . Scalp laceration 11/20/2014   11/01/14 > staples removed 11/18/2014    . Sleep apnea    could not tolerate cpap, uses mouth guard now  . TESTICULAR HYPOFUNCTION 09/15/2009   F/u urology on testosterone gel      Current Outpatient Medications on File Prior to Visit  Medication Sig Dispense Refill  . aspirin EC 81 MG tablet Take 1 tablet (81 mg total) by mouth daily. 100 tablet 3  . Cholecalciferol (VITAMIN D3) 2000 units capsule Take 1 capsule (2,000 Units total) by mouth daily. 100 capsule 3  . cyanocobalamin 1000 MCG tablet Take 1,000 mcg by mouth daily.    [name] ezetimibe (ZETIA) 10 MG tablet TAKE 1 TABLET BY MOUTH EVERY DAY 90 tablet 3  . Multiple Vitamin tablet Take 1 tablet by mouth daily.    . Multiple Vitamins-Minerals (ZINC PO) Take 1 tablet by mouth daily.    [name] RAPAFLO 8 MG CAPS capsule Take 8 mg by mouth daily with breakfast.     . ST JOHNS WORT PO Take 1 tablet by mouth daily.    [name] VALERIAN PO Take 1 tablet by mouth daily.    . vitamin E 400 UNIT capsule Take 400 Units by mouth daily.     No current facility-administered medications on file prior to visit.    Allergies  Allergen Reactions  . Simvastatin     Memory issues  . Trintellix [Vortioxetine]     Memory loss    Assessment/Plan:  1. Hyperlipidemia - LDL is above goal of < 70 mg/dL. Pt has aortic atherosclerosis with elevated coronary calcium score (273 - 48th percentile for age/sex). Continue Zetia 10 mg once daily and start Repatha 140 mg [place] once every 14 days. Will submit information to insurance company for prior authorization [facility] to [facility]. Will contact pt when Repatha is approved and available to pick up from the pharmacy. Pt's wife also takes Repatha and plans to start his injections on the same day she takes her injection for convenience. Updated allergies to reflect intolerance to Crestor. Follow up repeat lipids after the 3rd-4th injection - can schedule this once  timing of 1st dose is arranged.  Vertis Kelch, PharmD, [place] PGY2 Cardiology Pharmacy Resident [place] [id number] [place], [place] [address] Phone: [phone]; Fax: [phone]

## 2019-02-05 NOTE — Patient Instructions (Addendum)
Thank you for seeing [place] today!  We will start the paperwork with the insurance company for [place]. I will call once everything is approved and sent over to the pharmacy.  Our phone number [phone]

## 2019-02-06 ENCOUNTER — Other Ambulatory Visit: Payer: Self-pay

## 2019-02-06 ENCOUNTER — Ambulatory Visit (AMBULATORY_SURGERY_CENTER): Payer: Medicare Other | Admitting: *Deleted

## 2019-02-06 VITALS — Temp 96.8°F | Ht 66.0 in | Wt 152.0 lb

## 2019-02-06 DIAGNOSIS — Z1159 Encounter for screening for other viral diseases: Secondary | ICD-10-CM

## 2019-02-06 DIAGNOSIS — Z86010 Personal history of colonic polyps: Secondary | ICD-10-CM

## 2019-02-06 MED ORDER — NA SULFATE-K SULFATE-MG SULF 17.5-3.13-1.6 GM/177ML PO SOLN: ORAL | 0 refills | Status: DC

## 2019-02-06 NOTE — Progress Notes (Signed)
Patient is here in-person for PV. Wife with the patient here in PV (temp 96.8). Patient denies any allergies to eggs or soy. Patient denies any problems with anesthesia/sedation. Patient denies any oxygen use at home. Patient denies taking any diet/weight loss medications or blood thinners. Patient is not being treated for MRSA or C-diff. EMMI education assisgned to the patient for the procedure, this was explained and instructions given to patient. COVID-19 screening test is on 1/6 850 am, the pt is aware. Pt is aware that care partner will wait in the car during procedure; if they feel like they will be too hot or cold to wait in the car; they may wait in the 4 th floor lobby. Patient is aware to bring only one care partner. We want them to wear a mask (we do not have any that we can provide them), practice social distancing, and we will check their temperatures when they get here.  I did remind the patient that their care partner needs to stay in the parking lot the entire time and have a cell phone available, we will call them when the pt is ready for discharge. Patient will wear mask into building.

## 2019-02-07 ENCOUNTER — Telehealth (HOSPITAL_COMMUNITY): Payer: Self-pay

## 2019-02-07 NOTE — Telephone Encounter (Signed)
Spoke with the patient's wife, instructions given. She stated that he would be there for his test. Asked to call back with any questions. S.Raveena Hebdon EMTP

## 2019-02-13 ENCOUNTER — Ambulatory Visit (HOSPITAL_COMMUNITY): Payer: Medicare Other | Attending: Cardiology

## 2019-02-13 ENCOUNTER — Ambulatory Visit (HOSPITAL_BASED_OUTPATIENT_CLINIC_OR_DEPARTMENT_OTHER): Payer: Medicare Other

## 2019-02-13 ENCOUNTER — Other Ambulatory Visit: Payer: Self-pay

## 2019-02-13 VITALS — Ht 66.0 in | Wt 152.0 lb

## 2019-02-13 DIAGNOSIS — R0600 Dyspnea, unspecified: Secondary | ICD-10-CM | POA: Insufficient documentation

## 2019-02-13 DIAGNOSIS — R9431 Abnormal electrocardiogram [ECG] [EKG]: Secondary | ICD-10-CM

## 2019-02-13 DIAGNOSIS — R42 Dizziness and giddiness: Secondary | ICD-10-CM | POA: Insufficient documentation

## 2019-02-13 DIAGNOSIS — I10 Essential (primary) hypertension: Secondary | ICD-10-CM | POA: Insufficient documentation

## 2019-02-13 DIAGNOSIS — E785 Hyperlipidemia, unspecified: Secondary | ICD-10-CM | POA: Insufficient documentation

## 2019-02-13 LAB — ECHOCARDIOGRAM COMPLETE
Height: 66 in
Weight: 2432 oz

## 2019-02-13 LAB — MYOCARDIAL PERFUSION IMAGING
LV dias vol: 66 mL (ref 62–150)
LV sys vol: 24 mL
Peak HR: 91 {beats}/min
Rest HR: 62 {beats}/min
SDS: 0
SRS: 0
SSS: 0
TID: 1.18

## 2019-02-13 MED ORDER — REGADENOSON 0.4 MG/5ML IV SOLN
0.4000 mg | Freq: Once | INTRAVENOUS | Status: AC
  Administered 2019-02-13: 0.4 mg via INTRAVENOUS

## 2019-02-13 MED ORDER — TECHNETIUM TC 99M TETROFOSMIN IV KIT
11.0000 | PACK | Freq: Once | INTRAVENOUS | Status: AC | PRN
  Administered 2019-02-13: 11 via INTRAVENOUS
  Filled 2019-02-13: qty 11

## 2019-02-13 MED ORDER — TECHNETIUM TC 99M TETROFOSMIN IV KIT
32.9000 | PACK | Freq: Once | INTRAVENOUS | Status: AC | PRN
  Administered 2019-02-13: 32.9 via INTRAVENOUS
  Filled 2019-02-13: qty 33

## 2019-02-15 ENCOUNTER — Telehealth: Payer: Self-pay | Admitting: Cardiology

## 2019-02-15 NOTE — Telephone Encounter (Signed)
New Message  Patient's wife is calling in to go over the results of the patient's recent tests. Please give patient's wife a call back.

## 2019-02-15 NOTE — Telephone Encounter (Signed)
Left message for patient's wife letting her know that the patient's stress test was normal and that Dr. Radford Pax had not reviewed his echocardiogram results yet, but as soon as she did I would give them a call with those results. Advised her to call back with any additional questions.

## 2019-02-21 ENCOUNTER — Other Ambulatory Visit: Payer: Self-pay | Admitting: Gastroenterology

## 2019-02-21 ENCOUNTER — Ambulatory Visit (INDEPENDENT_AMBULATORY_CARE_PROVIDER_SITE_OTHER): Payer: Medicare Other

## 2019-02-21 DIAGNOSIS — Z1159 Encounter for screening for other viral diseases: Secondary | ICD-10-CM

## 2019-02-21 LAB — SARS CORONAVIRUS 2 (TAT 6-24 HRS): SARS Coronavirus 2: NEGATIVE

## 2019-02-23 ENCOUNTER — Encounter: Payer: Self-pay | Admitting: Gastroenterology

## 2019-02-23 ENCOUNTER — Other Ambulatory Visit: Payer: Self-pay

## 2019-02-23 ENCOUNTER — Ambulatory Visit (AMBULATORY_SURGERY_CENTER): Payer: Medicare Other | Admitting: Gastroenterology

## 2019-02-23 VITALS — BP 104/51 | HR 66 | Temp 98.7°F | Resp 18 | Ht 66.0 in | Wt 152.0 lb

## 2019-02-23 DIAGNOSIS — Z86010 Personal history of colonic polyps: Secondary | ICD-10-CM

## 2019-02-23 MED ORDER — SODIUM CHLORIDE 0.9 % IV SOLN: 500.0000 mL | Freq: Once | INTRAVENOUS | Status: DC

## 2019-02-23 NOTE — Op Note (Signed)
[facility] [facility] Patient Name: [name] Browning Procedure Date: 02/23/2019 10:40 AM MRN: [id number] Endoscopist: Milus Banister , MD Age: 79 Referring MD:  Date of Birth: 1941/01/26 Gender: Male Account #: [id number] Procedure:                Colonoscopy Indications:              High risk colon cancer surveillance: Personal                            history of colonic polyps; 2017 colonsocopy Dr.                            Ardis Hughs large TA removed from left colon piecemeal                            fashion, also two other small polyps. Colonoscopy                            02/2016 site of large descending polyp was clear of                            recurrent/residual polyp, two other small adenomas                            removed. Medicines:                Monitored Anesthesia Care Procedure:                Pre-Anesthesia Assessment:                           - Prior to the procedure, a History and Physical                            was performed, and patient medications and                            allergies were reviewed. The patient's tolerance of                            previous anesthesia was also reviewed. The risks                            and benefits of the procedure and the sedation                            options and risks were discussed with the patient.                            All questions were answered, and informed consent                            was obtained. Prior Anticoagulants: The patient has  taken no previous anticoagulant or antiplatelet                            agents. ASA Grade Assessment: II - A patient with                            mild systemic disease. After reviewing the risks                            and benefits, the patient was deemed in                            satisfactory condition to undergo the procedure.                           After obtaining informed consent, the colonoscope                             was passed under direct vision. Throughout the                            procedure, the patient's blood pressure, pulse, and                            oxygen saturations were monitored continuously. The                            Colonoscope was introduced through the anus and                            advanced to the the cecum, identified by                            appendiceal orifice and ileocecal valve. The                            colonoscopy was performed without difficulty. The                            patient tolerated the procedure well. The quality                            of the bowel preparation was good. The ileocecal                            valve, appendiceal orifice, and rectum were                            photographed. Scope In: 10:48:27 AM Scope Out: 10:56:53 AM Scope Withdrawal Time: 0 hours 6 minutes 17 seconds  Total Procedure Duration: 0 hours 8 minutes 26 seconds  Findings:                 The site of 2017 large polyp piecemeal polypectomy  was easily located in the descending colon due to                            previous Spot injection and it was clear of                            recurrent adenomatous mucosa.                           The entire examined colon was otherwise normal on                            direct and retroflexion views. Complications:            No immediate complications. Estimated blood loss:                            None. Estimated Blood Loss:     Estimated blood loss: none. Impression:               - The site of 2017 large polyp piecemeal                            polypectomy was easily located in the descending                            colon due to previous Spot injection and it was                            clear of recurrent adenomatous mucosa.                           - The colon was otherwise normal.                           - No polyps or  cancers. Recommendation:           - Patient has a contact number available for                            emergencies. The signs and symptoms of potential                            delayed complications were discussed with the                            patient. Return to normal activities tomorrow.                            Written discharge instructions were provided to the                            patient.                           - Resume previous diet.                           -  Continue present medications.                           - You do not need any further colon cancer                            screening tests (including stool testing). These                            types of tests generally stop around age 12-80. Milus Banister, MD 02/23/2019 11:02:50 AM This report has been signed electronically.

## 2019-02-23 NOTE — Progress Notes (Signed)
Temp JR  VS KA  Pt's states no medical or surgical changes since previsit or office visit.

## 2019-02-23 NOTE — Progress Notes (Signed)
PT taken to PACU. Monitors in place. VSS. Report given to RN. 

## 2019-02-23 NOTE — Patient Instructions (Signed)
No further screening colonoscopies needed    YOU HAD AN ENDOSCOPIC PROCEDURE TODAY AT [facility]:   Refer to the procedure report that was given to you for any specific questions about what was found during the examination.  If the procedure report does not answer your questions, please call your gastroenterologist to clarify.  If you requested that your care partner not be given the details of your procedure findings, then the procedure report has been included in a sealed envelope for you to review at your convenience later.  YOU SHOULD EXPECT: Some feelings of bloating in the abdomen. Passage of more gas than usual.  Walking can help get rid of the air that was put into your GI tract during the procedure and reduce the bloating. If you had a lower endoscopy (such as a colonoscopy or flexible sigmoidoscopy) you may notice spotting of blood in your stool or on the toilet paper. If you underwent a bowel prep for your procedure, you may not have a normal bowel movement for a few days.  Please Note:  You might notice some irritation and congestion in your nose or some drainage.  This is from the oxygen used during your procedure.  There is no need for concern and it should clear up in a day or so.  SYMPTOMS TO REPORT IMMEDIATELY:   Following lower endoscopy (colonoscopy or flexible sigmoidoscopy):  Excessive amounts of blood in the stool  Significant tenderness or worsening of abdominal pains  Swelling of the abdomen that is new, acute  Fever of 100F or higher   For urgent or emergent issues, a gastroenterologist can be reached at any hour by calling [phone].   DIET:  We do recommend a small meal at first, but then you may proceed to your regular diet.  Drink plenty of fluids but you should avoid alcoholic beverages for 24 hours.  ACTIVITY:  You should plan to take it easy for the rest of today and you should NOT DRIVE or use heavy machinery until tomorrow (because of  the sedation medicines used during the test).    FOLLOW UP: Our staff will call the number listed on your records 48-72 hours following your procedure to check on you and address any questions or concerns that you may have regarding the information given to you following your procedure. If we do not reach you, we will leave a message.  We will attempt to reach you two times.  During this call, we will ask if you have developed any symptoms of COVID 19. If you develop any symptoms (ie: fever, flu-like symptoms, shortness of breath, cough etc.) before then, please call [phone].  If you test positive for Covid 19 in the 2 weeks post procedure, please call and report this information to [place].    If any biopsies were taken you will be contacted by phone or by letter within the next 1-3 weeks.  Please call us at [phone] if you have not heard about the biopsies in 3 weeks.    SIGNATURES/CONFIDENTIALITY: You and/or your care partner have signed paperwork which will be entered into your electronic medical record.  These signatures attest to the fact that that the information above on your After Visit Summary has been reviewed and is understood.  Full responsibility of the confidentiality of this discharge information lies with you and/or your care-partner.

## 2019-02-27 ENCOUNTER — Telehealth: Payer: Self-pay

## 2019-02-27 NOTE — Telephone Encounter (Signed)
  Follow up Call-  Call back number 02/23/2019  Post procedure Call Back phone  # [phone]  Permission to leave phone message Yes  Some recent data might be hidden     Patient questions:  Do you have a fever, pain , or abdominal swelling? No. Pain Score  0 *  Have you tolerated food without any problems? Yes.    Have you been able to return to your normal activities? Yes.    Do you have any questions about your discharge instructions: Diet   No. Medications  No. Follow up visit  No.  Do you have questions or concerns about your Care? No.  Actions: * If pain score is 4 or above: No action needed, pain <4.  1. Have you developed a fever since your procedure? no  2.   Have you had an respiratory symptoms (SOB or cough) since your procedure? no  3.   Have you tested positive for COVID 19 since your procedure no  4.   Have you had any family members/close contacts diagnosed with the COVID 19 since your procedure?  no   If yes to any of these questions please route to Joylene John, RN and Alphonsa Gin, Therapist, sports.

## 2019-04-04 ENCOUNTER — Other Ambulatory Visit: Payer: Self-pay

## 2019-04-04 ENCOUNTER — Other Ambulatory Visit: Payer: Medicare Other | Admitting: *Deleted

## 2019-04-04 DIAGNOSIS — I700 Atherosclerosis of aorta: Secondary | ICD-10-CM

## 2019-04-04 LAB — LIPID PANEL
Chol/HDL Ratio: 1.7 ratio (ref 0.0–5.0)
Cholesterol, Total: 110 mg/dL (ref 100–199)
HDL: 63 mg/dL (ref >39)
LDL Chol Calc (NIH): 26 mg/dL (ref 0–99)
Triglycerides: 119 mg/dL (ref 0–149)
VLDL Cholesterol Cal: 21 mg/dL (ref 5–40)

## 2019-04-30 DIAGNOSIS — M545 Low back pain: Secondary | ICD-10-CM | POA: Diagnosis not present

## 2019-05-09 DIAGNOSIS — M546 Pain in thoracic spine: Secondary | ICD-10-CM | POA: Diagnosis not present

## 2019-05-09 DIAGNOSIS — M6281 Muscle weakness (generalized): Secondary | ICD-10-CM | POA: Diagnosis not present

## 2019-05-11 DIAGNOSIS — M6281 Muscle weakness (generalized): Secondary | ICD-10-CM | POA: Diagnosis not present

## 2019-05-11 DIAGNOSIS — M546 Pain in thoracic spine: Secondary | ICD-10-CM | POA: Diagnosis not present

## 2019-05-14 DIAGNOSIS — M546 Pain in thoracic spine: Secondary | ICD-10-CM | POA: Diagnosis not present

## 2019-05-14 DIAGNOSIS — M6281 Muscle weakness (generalized): Secondary | ICD-10-CM | POA: Diagnosis not present

## 2019-05-16 DIAGNOSIS — M546 Pain in thoracic spine: Secondary | ICD-10-CM | POA: Diagnosis not present

## 2019-05-16 DIAGNOSIS — M6281 Muscle weakness (generalized): Secondary | ICD-10-CM | POA: Diagnosis not present

## 2019-05-21 DIAGNOSIS — M546 Pain in thoracic spine: Secondary | ICD-10-CM | POA: Diagnosis not present

## 2019-05-23 DIAGNOSIS — M546 Pain in thoracic spine: Secondary | ICD-10-CM | POA: Diagnosis not present

## 2019-05-23 DIAGNOSIS — M6281 Muscle weakness (generalized): Secondary | ICD-10-CM | POA: Diagnosis not present

## 2019-05-25 DIAGNOSIS — M6281 Muscle weakness (generalized): Secondary | ICD-10-CM | POA: Diagnosis not present

## 2019-05-25 DIAGNOSIS — M546 Pain in thoracic spine: Secondary | ICD-10-CM | POA: Diagnosis not present

## 2019-06-08 ENCOUNTER — Telehealth: Payer: Self-pay | Admitting: Internal Medicine

## 2019-06-08 NOTE — Telephone Encounter (Signed)
New message:   1.Medication Requested: ezetimibe (ZETIA) 10 MG tablet 2. Pharmacy (Name, Street, [id number] [place], [place] 3. On Med List: Yes  4. Last Visit with PCP: 10/12/18  5. Next visit date with PCP: 10/15/19   Agent: Please be advised that RX refills may take up to 3 business days. We ask that you follow-up with your pharmacy.

## 2019-06-08 NOTE — Telephone Encounter (Signed)
Spouse notified to contact pharmacy for refill as 12/2018 #90 with 3 refills was sent

## 2019-10-15 ENCOUNTER — Ambulatory Visit: Payer: Medicare Other | Admitting: Internal Medicine

## 2019-10-23 ENCOUNTER — Ambulatory Visit: Payer: Medicare Other | Attending: Internal Medicine

## 2019-10-23 DIAGNOSIS — Z23 Encounter for immunization: Secondary | ICD-10-CM

## 2019-10-23 NOTE — Progress Notes (Signed)
   Covid-19 Vaccination Clinic  Name:  [name] Browning    MRN: [id number] DOB: 01-26-1941  10/23/2019  Mr. [name] Browning was observed post Covid-19 immunization for 15 minutes without incident. He was provided with Vaccine Information Sheet and instruction to access the V-Safe system.   Mr. [name] Browning was instructed to call 911 with any severe reactions post vaccine: [name] Difficulty breathing  . Swelling of face and throat  . A fast heartbeat  . A bad rash all over body  . Dizziness and weakness

## 2019-10-26 ENCOUNTER — Other Ambulatory Visit: Payer: Self-pay

## 2019-10-26 ENCOUNTER — Encounter: Payer: Self-pay | Admitting: Internal Medicine

## 2019-10-26 ENCOUNTER — Ambulatory Visit (INDEPENDENT_AMBULATORY_CARE_PROVIDER_SITE_OTHER): Payer: Medicare Other | Admitting: Internal Medicine

## 2019-10-26 VITALS — BP 128/76 | HR 65 | Temp 98.4°F | Ht 66.0 in | Wt 145.0 lb

## 2019-10-26 DIAGNOSIS — N320 Bladder-neck obstruction: Secondary | ICD-10-CM | POA: Diagnosis not present

## 2019-10-26 DIAGNOSIS — F32A Depression, unspecified: Secondary | ICD-10-CM

## 2019-10-26 DIAGNOSIS — F1996 Other psychoactive substance use, unspecified with psychoactive substance-induced persisting amnestic disorder: Secondary | ICD-10-CM

## 2019-10-26 DIAGNOSIS — E785 Hyperlipidemia, unspecified: Secondary | ICD-10-CM

## 2019-10-26 DIAGNOSIS — F329 Major depressive disorder, single episode, unspecified: Secondary | ICD-10-CM

## 2019-10-26 DIAGNOSIS — I10 Essential (primary) hypertension: Secondary | ICD-10-CM

## 2019-10-26 MED ORDER — EZETIMIBE 10 MG PO TABS: 10.0000 mg | ORAL_TABLET | Freq: Every day | ORAL | 3 refills | Status: DC

## 2019-10-26 NOTE — Assessment & Plan Note (Signed)
Repatha Zetia 

## 2019-10-26 NOTE — Progress Notes (Signed)
Subjective:  Patient ID: [name] Browning, male    DOB: 03/02/1940  Age: 79 y.o. MRN: [id number]  CC: No chief complaint on file.   HPI [name] Browning presents for  Dyslipidemia, BPH, hypogonadism C/o LBP - strained - had PT  Outpatient Medications Prior to Visit  Medication Sig Dispense Refill  . aspirin EC 81 MG tablet Take 1 tablet (81 mg total) by mouth daily. 100 tablet 3  . Cholecalciferol (VITAMIN D3) 2000 units capsule Take 1 capsule (2,000 Units total) by mouth daily. 100 capsule 3  . cyanocobalamin 1000 MCG tablet Take 1,000 mcg by mouth daily.    . Evolocumab (REPATHA SURECLICK) 270 MG/ML SOAJ Inject 1 pen into the skin every 14 (fourteen) days. 6 pen 3  . Multiple Vitamin tablet Take 1 tablet by mouth daily.    . Multiple Vitamins-Minerals (ZINC PO) Take 1 tablet by mouth daily.    [name] RAPAFLO 8 MG CAPS capsule Take 8 mg by mouth daily with breakfast.     . ST JOHNS WORT PO Take 1 tablet by mouth daily.    [name] VALERIAN PO Take 1 tablet by mouth daily.    . vitamin E 400 UNIT capsule Take 400 Units by mouth daily.    [name] ezetimibe (ZETIA) 10 MG tablet TAKE 1 TABLET BY MOUTH EVERY DAY 90 tablet 3   No facility-administered medications prior to visit.    ROS: Review of Systems  Constitutional: Positive for fatigue. Negative for appetite change and unexpected weight change.  HENT: Negative for congestion, nosebleeds, sneezing, sore throat and trouble swallowing.   Eyes: Negative for itching and visual disturbance.  Respiratory: Negative for cough.   Cardiovascular: Negative for chest pain, palpitations and leg swelling.  Gastrointestinal: Negative for abdominal distention, blood in stool, diarrhea and nausea.  Genitourinary: Negative for frequency and hematuria.  Musculoskeletal: Negative for back pain, gait problem, joint swelling and neck pain.  Skin: Negative for rash.  Neurological: Negative for dizziness, tremors, speech difficulty and weakness.    Psychiatric/Behavioral: Negative for agitation, dysphoric mood, sleep disturbance and suicidal ideas. The patient is not nervous/anxious.     Objective:  BP 128/76 (BP Location: Left Arm, Patient Position: Sitting, Cuff Size: Large)   Pulse 65   Temp 98.4 F (36.9 C) (Oral)   Ht 5\' 6"  (1.676 m)   Wt 145 lb (65.8 kg)   SpO2 96%   BMI 23.40 kg/m   BP Readings from Last 3 Encounters:  10/26/19 128/76  02/23/19 (!) 104/51  01/31/19 (!) 142/78    Wt Readings from Last 3 Encounters:  10/26/19 145 lb (65.8 kg)  02/23/19 152 lb (68.9 kg)  02/13/19 152 lb (68.9 kg)    Physical Exam Constitutional:      General: He is not in acute distress.    Appearance: He is well-developed.     Comments: NAD  Eyes:     Conjunctiva/sclera: Conjunctivae normal.     Pupils: Pupils are equal, round, and reactive to light.  Neck:     Thyroid: No thyromegaly.     Vascular: No JVD.  Cardiovascular:     Rate and Rhythm: Normal rate and regular rhythm.     Heart sounds: Normal heart sounds. No murmur heard.  No friction rub. No gallop.   Pulmonary:     Effort: Pulmonary effort is normal. No respiratory distress.     Breath sounds: Normal breath sounds. No wheezing or rales.  Chest:     Chest  wall: No tenderness.  Abdominal:     General: Bowel sounds are normal. There is no distension.     Palpations: Abdomen is soft. There is no mass.     Tenderness: There is no abdominal tenderness. There is no guarding or rebound.  Musculoskeletal:        General: No tenderness. Normal range of motion.     Cervical back: Normal range of motion.  Lymphadenopathy:     Cervical: No cervical adenopathy.  Skin:    General: Skin is warm and dry.     Findings: No rash.  Neurological:     Mental Status: He is alert and oriented to person, place, and time.     Cranial Nerves: No cranial nerve deficit.     Motor: No abnormal muscle tone.     Coordination: Coordination normal.     Gait: Gait normal.     Deep  Tendon Reflexes: Reflexes are normal and symmetric.  Psychiatric:        Behavior: Behavior normal.        Thought Content: Thought content normal.        Judgment: Judgment normal.     Lab Results  Component Value Date   WBC 6.5 10/12/2018   HGB 13.0 10/12/2018   HCT 37.4 (L) 10/12/2018   PLT 362.0 10/12/2018   GLUCOSE 92 10/12/2018   CHOL 110 04/04/2019   TRIG 119 04/04/2019   HDL 63 04/04/2019   LDLDIRECT 152.0 10/12/2018   LDLCALC 26 04/04/2019   ALT 18 10/12/2018   AST 17 10/12/2018   NA 139 10/12/2018   K 4.3 10/12/2018   CL 102 10/12/2018   CREATININE 1.19 10/12/2018   BUN 29 (H) 10/12/2018   CO2 27 10/12/2018   TSH 2.86 10/12/2018   PSA 0.68 10/12/2018   HGBA1C 5.8 (H) 05/19/2016    CT CARDIAC SCORING  Addendum Date: 12/04/2018   ADDENDUM REPORT: 12/04/2018 16:39 EXAM: OVER-READ INTERPRETATION  CT CHEST The following report is an over-read performed by radiologist Dr. Fonnie Birkenhead [facility] on 12/04/2018. This over-read does not include interpretation of cardiac or coronary anatomy or pathology. The interpretation by the cardiologist is attached. COMPARISON:  None. FINDINGS: Atherosclerotic calcification of the aorta. Heart is at the upper limits of normal in size. No pericardial or pleural effusion. Calcified granuloma in the right lower lobe. Visualized portions of the lungs are otherwise clear. Visualized portions of the upper abdomen and bones are unremarkable. IMPRESSION: Aortic atherosclerosis (ICD10-170.0). Electronically Signed   By: Lorin Picket M.D.   On: 12/04/2018 16:39   Result Date: 12/04/2018 CLINICAL DATA:  Risk stratification EXAM: Coronary Calcium Score MEDICATIONS: None TECHNIQUE: The patient was scanned on a [employer]. Axial non-contrast 3 mm slices were carried out through the heart. The data set was analyzed on a dedicated work station and scored using the [place]. FINDINGS: Non-cardiac: See separate report  from [facility]. Ascending Aorta: Normal size, minimal calcifications. Pericardium: Normal. Coronary arteries: Normal origin. IMPRESSION: Coronary calcium score of 273. This was 6 percentile for age and sex matched control. Electronically Signed: By: Ena Dawley On: 12/04/2018 13:17    Assessment & Plan:   There are no diagnoses linked to this encounter.   Meds ordered this encounter  Medications  . ezetimibe (ZETIA) 10 MG tablet    Sig: Take 1 tablet (10 mg total) by mouth daily.    Dispense:  90 tablet    Refill:  3  Follow-up: No follow-ups on file.  Walker Kehr, MD

## 2019-10-26 NOTE — Assessment & Plan Note (Signed)
F/u w/Dr Tish Frederickson try Lion's Mane Mushroom extract or capsules for memory

## 2019-10-26 NOTE — Patient Instructions (Signed)
You can try Lion's Mane Mushroom extract or capsules for memory   

## 2019-10-26 NOTE — Assessment & Plan Note (Signed)
Labs BP Readings from Last 3 Encounters:  10/26/19 128/76  02/23/19 (!) 104/51  01/31/19 (!) 142/78

## 2019-10-26 NOTE — Assessment & Plan Note (Signed)
Doing well 

## 2019-10-27 LAB — CBC WITH DIFFERENTIAL/PLATELET
Absolute Monocytes: 795 cells/uL (ref 200–950)
Basophils Absolute: 32 cells/uL (ref 0–200)
Basophils Relative: 0.6 %
Eosinophils Absolute: 254 cells/uL (ref 15–500)
Eosinophils Relative: 4.8 %
HCT: 37.8 % — ABNORMAL LOW (ref 38.5–50.0)
Hemoglobin: 12.5 g/dL — ABNORMAL LOW (ref 13.2–17.1)
Lymphs Abs: 1410 cells/uL (ref 850–3900)
MCH: 30.3 pg (ref 27.0–33.0)
MCHC: 33.1 g/dL (ref 32.0–36.0)
MCV: 91.5 fL (ref 80.0–100.0)
MPV: 9.3 fL (ref 7.5–12.5)
Monocytes Relative: 15 %
Neutro Abs: 2809 cells/uL (ref 1500–7800)
Neutrophils Relative %: 53 %
Platelets: 325 10*3/uL (ref 140–400)
RBC: 4.13 10*6/uL — ABNORMAL LOW (ref 4.20–5.80)
RDW: 12.6 % (ref 11.0–15.0)
Total Lymphocyte: 26.6 %
WBC: 5.3 10*3/uL (ref 3.8–10.8)

## 2019-10-27 LAB — COMPLETE METABOLIC PANEL WITH GFR
AG Ratio: 1.8 (calc) (ref 1.0–2.5)
ALT: 16 U/L (ref 9–46)
AST: 18 U/L (ref 10–35)
Albumin: 4.4 g/dL (ref 3.6–5.1)
Alkaline phosphatase (APISO): 81 U/L (ref 35–144)
BUN: 20 mg/dL (ref 7–25)
CO2: 28 mmol/L (ref 20–32)
Calcium: 9.7 mg/dL (ref 8.6–10.3)
Chloride: 106 mmol/L (ref 98–110)
Creat: 1.07 mg/dL (ref 0.70–1.18)
GFR, Est African American: 77 mL/min/{1.73_m2} (ref >=60)
GFR, Est Non African American: 66 mL/min/{1.73_m2} (ref >=60)
Globulin: 2.5 g/dL (calc) (ref 1.9–3.7)
Glucose, Bld: 97 mg/dL (ref 65–99)
Potassium: 4.6 mmol/L (ref 3.5–5.3)
Sodium: 144 mmol/L (ref 135–146)
Total Bilirubin: 0.4 mg/dL (ref 0.2–1.2)
Total Protein: 6.9 g/dL (ref 6.1–8.1)

## 2019-10-27 LAB — PSA: PSA: 0.5 ng/mL (ref <=4.0)

## 2019-10-27 LAB — URINALYSIS
Bilirubin Urine: NEGATIVE
Glucose, UA: NEGATIVE
Hgb urine dipstick: NEGATIVE
Ketones, ur: NEGATIVE
Leukocytes,Ua: NEGATIVE
Nitrite: NEGATIVE
Protein, ur: NEGATIVE
Specific Gravity, Urine: 1.017 (ref 1.001–1.03)
pH: 5.5 (ref 5.0–8.0)

## 2019-10-27 LAB — TSH: TSH: 3.45 mIU/L (ref 0.40–4.50)

## 2019-10-27 LAB — LIPID PANEL
Cholesterol: 114 mg/dL (ref <200)
HDL: 75 mg/dL (ref >=40)
LDL Cholesterol (Calc): 23 mg/dL (calc)
Non-HDL Cholesterol (Calc): 39 mg/dL (calc) (ref <130)
Total CHOL/HDL Ratio: 1.5 (calc) (ref <5.0)
Triglycerides: 76 mg/dL (ref <150)

## 2019-10-29 ENCOUNTER — Telehealth: Payer: Self-pay | Admitting: Internal Medicine

## 2019-10-29 ENCOUNTER — Other Ambulatory Visit: Payer: Self-pay | Admitting: Internal Medicine

## 2019-10-29 DIAGNOSIS — D638 Anemia in other chronic diseases classified elsewhere: Secondary | ICD-10-CM

## 2019-10-29 NOTE — Telephone Encounter (Signed)
° ° °  Spouse calling to discuss lab results She wants to know what lifestyle changes should be made to improve labs

## 2019-10-30 ENCOUNTER — Other Ambulatory Visit: Payer: Self-pay

## 2019-10-30 NOTE — Progress Notes (Unsigned)
c 

## 2019-10-31 NOTE — Telephone Encounter (Signed)
No need to change the lifestyle. Thanks

## 2019-11-02 NOTE — Telephone Encounter (Signed)
Spoke with patient today. 

## 2019-11-05 DIAGNOSIS — Z85828 Personal history of other malignant neoplasm of skin: Secondary | ICD-10-CM | POA: Diagnosis not present

## 2019-11-05 DIAGNOSIS — D1801 Hemangioma of skin and subcutaneous tissue: Secondary | ICD-10-CM | POA: Diagnosis not present

## 2019-11-05 DIAGNOSIS — L812 Freckles: Secondary | ICD-10-CM | POA: Diagnosis not present

## 2019-11-05 DIAGNOSIS — L570 Actinic keratosis: Secondary | ICD-10-CM | POA: Diagnosis not present

## 2019-11-05 DIAGNOSIS — L84 Corns and callosities: Secondary | ICD-10-CM | POA: Diagnosis not present

## 2019-11-05 DIAGNOSIS — B078 Other viral warts: Secondary | ICD-10-CM | POA: Diagnosis not present

## 2019-11-05 DIAGNOSIS — L858 Other specified epidermal thickening: Secondary | ICD-10-CM | POA: Diagnosis not present

## 2019-11-05 DIAGNOSIS — L821 Other seborrheic keratosis: Secondary | ICD-10-CM | POA: Diagnosis not present

## 2019-11-05 DIAGNOSIS — D485 Neoplasm of uncertain behavior of skin: Secondary | ICD-10-CM | POA: Diagnosis not present

## 2019-11-13 ENCOUNTER — Other Ambulatory Visit: Payer: Self-pay

## 2019-11-13 ENCOUNTER — Ambulatory Visit (INDEPENDENT_AMBULATORY_CARE_PROVIDER_SITE_OTHER): Payer: Medicare Other

## 2019-11-13 DIAGNOSIS — Z23 Encounter for immunization: Secondary | ICD-10-CM | POA: Diagnosis not present

## 2019-11-13 DIAGNOSIS — Z0000 Encounter for general adult medical examination without abnormal findings: Secondary | ICD-10-CM

## 2019-11-13 NOTE — Progress Notes (Addendum)
I connected with [name] Browning today by telephone and verified that I am speaking with the correct person using two identifiers. Location patient: home Location provider: work Persons participating in the virtual visit: Raiford Fetterman, wife (per DPR) and Lisette Abu, LPN.   I discussed the limitations, risks, security and privacy concerns of performing an evaluation and management service by telephone and the availability of in person appointments. I also discussed with the patient that there may be a patient responsible charge related to this service. The patient expressed understanding and verbally consented to this telephonic visit.    Interactive audio and video telecommunications were attempted between this provider and patient, however failed, due to patient having technical difficulties OR patient did not have access to video capability.  We continued and completed visit with audio only.  Some vital signs may be absent or patient reported.   Time Spent with patient on telephone encounter: 25 minutes  Subjective:   [name] Browning is a 79 y.o. male who presents for Medicare Annual/Subsequent preventive examination.  Review of Systems    No ROS. Medicare Wellness Visit. Cardiac Risk Factors include: advanced age (>5men, >55 women);dyslipidemia;family history of premature cardiovascular disease;hypertension;male gender Sleep Patterns: Positive for sleep issues. Takes medication to help sleep. Home Safety/Smoke Alarms: Feels safe in home; uses home alarm. Smoke alarms in place. Living environment: 1-story home; Lives with spouse; no needs for DME; good support system. Seat Belt Safety/Bike Helmet: Wears seat belt.    Objective:    There were no vitals filed for this visit. There is no height or weight on file to calculate BMI.  Advanced Directives 11/13/2019 07/12/2018 05/25/2017 11/21/2015 11/04/2014 08/02/2014 07/31/2014  Does Patient Have a Medical Advance Directive? Yes  Yes Yes Yes No No Yes  Type of Paramedic of [place];Living will [place];Living will [place];Living will [place];Living will - - [place];Living will  Does patient want to make changes to medical advance directive? No - Patient declined - - - - - No - Patient declined  Copy of [place] in Chart? No - copy requested No - copy requested No - copy requested No - copy requested - - -  Would patient like information on creating a medical advance directive? - - - - No - patient declined information No - patient declined information -  Pre-existing out of facility DNR order (yellow form or pink MOST form) - - - - - - -    Current Medications (verified) Outpatient Encounter Medications as of 11/13/2019  Medication Sig   aspirin EC 81 MG tablet Take 1 tablet (81 mg total) by mouth daily.   Cholecalciferol (VITAMIN D3) 2000 units capsule Take 1 capsule (2,000 Units total) by mouth daily.   cyanocobalamin 1000 MCG tablet Take 1,000 mcg by mouth daily.   Evolocumab (REPATHA SURECLICK) 149 MG/ML SOAJ Inject 1 pen into the skin every 14 (fourteen) days.   ezetimibe (ZETIA) 10 MG tablet Take 1 tablet (10 mg total) by mouth daily.   Multiple Vitamin tablet Take 1 tablet by mouth daily.   Multiple Vitamins-Minerals (ZINC PO) Take 1 tablet by mouth daily.   RAPAFLO 8 MG CAPS capsule Take 8 mg by mouth daily with breakfast.    ST JOHNS WORT PO Take 1 tablet by mouth daily.   VALERIAN PO Take 1 tablet by mouth daily.   vitamin E 400 UNIT capsule Take 400 Units  by mouth daily.   No facility-administered encounter medications on file as of 11/13/2019.    Allergies (verified) Crestor [rosuvastatin], Simvastatin, and Trintellix [vortioxetine]   History: Past Medical History:  Diagnosis Date   Acute bronchitis 08/11/2016   6/18  H/o asthma as a child...   ADD (attention deficit  disorder)    Anemia in chronic illness 06/15/2013   Followed as Primary Care Patient/ [facility]/ [place] - Fe studies ok 06/15/13 > stool cards received 06/27/2013 > neg x 5> down to 11.4 08/23/13  > referred for hematology 09/04/2013> Alvy Bimler felt it was due to low Testosterone  Resolved as of 08/14/2015         Anxiety    Asthma    AS A CHILD   Ataxia 06/07/2016   2018 poss age related/meds   Atrial flutter ([place])    a. s/p RFCA 07/2011.  Xarelto d/c 08/2011.   Bladder neck obstruction 10/04/2016   Dr Karsten Ro Rapaflo   Cataracts, bilateral    Colon polyps 10/04/2016   Dr Ardis Hughs Due colon 02/2019   Conjunctivitis 09/14/2013   Cough 02/19/2014   DgEs   01/23/16 Unremarkable esophagram. Allergy profile 04/05/2016 >  Eos 0.4 /  IgE  284  RAST pos grass/trees/dust > rec allergy eval    Depression    Diarrhea 02/11/2017   Disturbance in sleep behavior 04/30/2010    Trazodone  Lorazepam prn   DIZZINESS 01/24/2008   Qualifier: Diagnosis of  By: Royal Piedra NP, Tammy     DJD (degenerative joint disease) of knee    Drug-induced memory loss ([place]) 02/22/2014   D/c statin 01/2014 > improved  D/c Trintellix Dr Tish Frederickson   Dyspnea 06/18/2011   Dysrhythmia    hx a-flutter with successful ablation 2013   Essential hypertension 06/18/2011   NAS diet ASA - unable to use due to h/o AVM bleeding - colon 2003   FATIGUE, ACUTE 03/23/2007   Qualifier: Diagnosis of  By: Melvyn Novas MD, Christena Deem    GERD (gastroesophageal reflux disease)    Heart murmur    a. 06/2011 Echo: 65-70%, mild MR   History of lower GI bleeding 09/26/2012   Followed in GI clinic/ [facility]/ [place]  - colonoscopy 05/22/2001  And 06/14/11 > c/w hemorrhoids and cecal avm    Hyperlipidemia    Hyperlipidemia LDL goal <130 03/22/2007   Followed as Primary Care Patient/ [facility]/ [place]   Zetia    S/P cardiac cath    a. in 1990's - Dr. Gwenlyn Found - WNL   Scalp laceration 11/20/2014   11/01/14 > staples removed 11/18/2014     Sleep apnea    could not  tolerate cpap, uses mouth guard now   TESTICULAR HYPOFUNCTION 09/15/2009   F/u urology on testosterone gel     Past Surgical History:  Procedure Laterality Date   ATRIAL FLUTTER ABLATION N/A 08/06/2011   Procedure: ATRIAL FLUTTER ABLATION;  Surgeon: Deboraha Sprang, MD;  [facility] C[facility] LAB;  Service: Cardiovascular;  Laterality: N/A;   COLONOSCOPY  02/27/2016   LESION REMOVAL N/A 08/02/2014   Procedure: EXCISION UPPER LIP VASCULAR ANOMALY ;  Surgeon: Irene Limbo, MD;  Location: [place];  Service: Plastics;  Laterality: N/A;   MASTOIDECTOMY     POLYPECTOMY     TONSILLECTOMY     WRIST FRACTURE SURGERY  2007   left   Family History  Problem Relation Age of Onset   Heart disease Mother    Leukemia Mother  Prostate cancer Father    Stroke Father    Hypertension Brother    Prostate cancer Brother    Colon cancer Neg Hx    Stomach cancer Neg Hx    Heart attack Neg Hx    Esophageal cancer Neg Hx    Rectal cancer Neg Hx    Colon polyps Neg Hx    Social History   Socioeconomic History   Marital status: Married    Spouse name: Not on file   Number of children: Not on file   Years of education: Not on file   Highest education level: Not on file  Occupational History   Occupation: RetiredWater quality scientist for [employer]  Tobacco Use   Smoking status: Former Smoker    Packs/day: 3.00    Years: 7.00    Pack years: 21.00    Types: Cigarettes    Start date: 1963    Quit date: 02/16/1968    Years since quitting: 51.7   Smokeless tobacco: Never Used  Vaping Use   Vaping Use: Never used  Substance and Sexual Activity   Alcohol use: No   Drug use: No   Sexual activity: Yes  Other Topics Concern   Not on file  Social History Narrative   Not on file   Social Determinants of Health   Financial Resource Strain: Low Risk    Difficulty of Paying Living Expenses: Not hard at all  Food Insecurity: No Food Insecurity   Worried About Charity fundraiser in the  Last Year: Never true   [place] in the Last Year: Never true  Transportation Needs: No Transportation Needs   Lack of Transportation (Medical): No   Lack of Transportation (Non-Medical): No  Physical Activity: Sufficiently Active   Days of Exercise per Week: 7 days   Minutes of Exercise per Session: 60 min  Stress: No Stress Concern Present   Feeling of Stress : Not at all  Social Connections: Moderately Isolated   Frequency of Communication with Friends and Family: Never   Frequency of Social Gatherings with Friends and Family: Never   Attends Religious Services: Never   Marine scientist or Organizations: Yes   Attends Music therapist: More than 4 times per year   Marital Status: Married    Tobacco Counseling Counseling given: Not Answered   Clinical Intake:  Pre-visit preparation completed: Yes  Pain : No/denies pain     Nutritional Risks: None Diabetes: No  How often do you need to have someone help you when you read instructions, pamphlets, or other written materials from your doctor or pharmacy?: 1 - Never What is the last grade level you completed in school?: Graduate School  Diabetic? no  Interpreter Needed?: No  Information entered by :: [employer]. Gyan Cambre, LPN   Activities of Daily Living In your present state of health, do you have any difficulty performing the following activities: 11/13/2019  Hearing? N  Vision? N  Difficulty concentrating or making decisions? N  Walking or climbing stairs? N  Dressing or bathing? N  Doing errands, shopping? N  Preparing Food and eating ? N  Using the Toilet? N  In the past six months, have you accidently leaked urine? N  Do you have problems with loss of bowel control? N  Managing your Medications? N  Managing your Finances? N  Housekeeping or managing your Housekeeping? N  Some recent data might be hidden    Patient Care Team:  Plotnikov, Evie Lacks, MD as PCP - General (Internal  Medicine) Sueanne Margarita, MD as PCP - Cardiology (Cardiology) Chucky May, MD as Consulting Physician (Psychiatry) Kathie Rhodes, MD as Consulting Physician (Urology) Deboraha Sprang, MD as Consulting Physician (Cardiology)  Indicate any recent Medical Services you may have received from other than [facility] providers in the past year (date may be approximate).     Assessment:   This is a routine wellness examination for [name] Browning.  Hearing/Vision screen No exam data present  Dietary issues and exercise activities discussed: Current Exercise Habits: Home exercise routine, Type of exercise: walking;Other - see comments;strength training/weights;stretching;treadmill (golf 3 x week, workout every morning), Time (Minutes): 60, Frequency (Times/Week): 7, Weekly Exercise (Minutes/Week): 420, Intensity: Moderate, Exercise limited by: cardiac condition(s)  Goals      Patient Stated     Continue to enjoy reading, exercise, and eat healthy. Enjoy life and travel.       Depression Screen PHQ 2/9 Scores 11/13/2019 07/12/2018 05/25/2017 10/04/2016 06/07/2016  PHQ - 2 Score 0 0 1 0 0  PHQ- 9 Score - - 1 0 -    Fall Risk Fall Risk  11/13/2019 07/12/2018 05/25/2017 06/07/2016  Falls in the past year? 0 1 No No  Number falls in past yr: 0 0 - -  Injury with Fall? 0 0 - -  Risk for fall due to : No Fall Risks - Impaired mobility;Impaired balance/gait -  Follow up Falls evaluation completed;Education provided - - -    Any stairs in or around the home? No  If so, are there any without handrails? No  Home free of loose throw rugs in walkways, pet beds, electrical cords, etc? Yes  Adequate lighting in your home to reduce risk of falls? Yes   ASSISTIVE DEVICES UTILIZED TO PREVENT FALLS:  Life alert? No  Use of a cane, walker or w/c? No  Grab bars in the bathroom? Yes  Shower chair or bench in shower? Yes  Elevated toilet seat or a handicapped toilet? Yes   TIMED UP AND GO:  Was the test performed?  No .  Length of time to ambulate 10 feet: 0 sec.   Gait steady and fast without use of assistive device  Cognitive Function: MMSE - Mini Mental State Exam 05/25/2017 05/19/2016  Not completed: Refused -  Orientation to time - 5  Orientation to Place - 5  Registration - 3  Attention/ Calculation - 5  Recall - 2  Language- name 2 objects - 2  Language- repeat - 1  Language- follow 3 step command - 3  Language- read & follow direction - 1  Write a sentence - 1  Copy design - 1  Total score - 29        Immunizations Immunization History  Administered Date(s) Administered   Fluad Quad(high Dose 65+) 10/12/2018   Influenza Split 01/15/2013, 01/28/2014   Influenza Whole 01/16/2011, 01/06/2012, 11/16/2015   Influenza, High Dose Seasonal PF 12/02/2016, 10/31/2017   Influenza,inj,Quad PF,6+ Mos 11/18/2014   Influenza-Unspecified 10/31/2017   PFIZER SARS-COV-2 Vaccination 03/06/2019, 03/27/2019, 10/23/2019   Pneumococcal Conjugate-13 07/22/2014   Pneumococcal Polysaccharide-23 04/15/2008, 06/07/2016   Tdap 06/08/2011   Zoster Recombinat (Shingrix) 10/16/2017, 01/23/2018    TDAP status: Up to date Flu Vaccine status: Up to date Pneumococcal vaccine status: Up to date Covid-19 vaccine status: Completed vaccines  Qualifies for Shingles Vaccine? Yes   Zostavax completed Yes   Shingrix Completed?: Yes  Screening Tests Health Maintenance  Topic  Date Due   Hepatitis C Screening  Never done   INFLUENZA VACCINE  09/16/2019   TETANUS/TDAP  06/07/2021   COVID-19 Vaccine  Completed   PNA vac Low Risk Adult  Completed    Health Maintenance  Health Maintenance Due  Topic Date Due   Hepatitis C Screening  Never done   INFLUENZA VACCINE  09/16/2019    Colorectal cancer screening: Completed 02/23/2019. Repeat every 0 years  Lung Cancer Screening: (Low Dose CT Chest recommended if Age 6-80 years, 30 pack-year currently smoking OR have quit w/in 15years.) does not qualify.   Lung  Cancer Screening Referral: no  Additional Screening:  Hepatitis C Screening: does qualify; Completed no  Vision Screening: Recommended annual ophthalmology exams for early detection of glaucoma and other disorders of the eye. Is the patient up to date with their annual eye exam?  Yes  Who is the provider or what is the name of the office in which the patient attends annual eye exams? Rutherford Guys, MD If pt is not established with a provider, would they like to be referred to a provider to establish care? No .   Dental Screening: Recommended annual dental exams for proper oral hygiene  Community Resource Referral / Chronic Care Management: CRR required this visit?  No   CCM required this visit?  No      Plan:     I have personally reviewed and noted the following in the patient's chart:   Medical and social history Use of alcohol, tobacco or illicit drugs  Current medications and supplements Functional ability and status Nutritional status Physical activity Advanced directives List of other physicians Hospitalizations, surgeries, and ER visits in previous 12 months Vitals Screenings to include cognitive, depression, and falls Referrals and appointments  In addition, I have reviewed and discussed with patient certain preventive protocols, quality metrics, and best practice recommendations. A written personalized care plan for preventive services as well as general preventive health recommendations were provided to patient.     Sheral Flow, LPN   11/08/2681   Nurse Notes:  Patient is cogitatively intact. There were no vitals filed for this visit. There is no height or weight on file to calculate BMI. Patient stated that he has no issues with gait or balance; does not use any assistive devices. Patient is very active in golfing, walking and working out daily.   Medical screening examination/treatment/procedure(s) were performed by non-physician practitioner and as  supervising physician I was immediately available for consultation/collaboration.  I agree with above. Lew Dawes, MD

## 2019-11-13 NOTE — Patient Instructions (Addendum)
Mr. [name] Browning , Thank you for taking time to come for your Medicare Wellness Visit. I appreciate your ongoing commitment to your health goals. Please review the following plan we discussed and let me know if I can assist you in the future.   Screening recommendations/referrals: Colonoscopy: 02/23/2019 Recommended yearly ophthalmology/optometry visit for glaucoma screening and checkup Recommended yearly dental visit for hygiene and checkup  Vaccinations: Influenza vaccine: 10/12/2018 Pneumococcal vaccine: completed Tdap vaccine: 06/08/2011; due every 10 years Shingles vaccine: completed   Covid-19: completed (received booster)  Advanced directives: Please bring a copy of your health care power of attorney and living will to the office at your convenience.  Conditions/risks identified: Yes; Reviewed health maintenance screenings with patient today and relevant education, vaccines, and/or referrals were provided. Please continue to do your personal lifestyle choices by: daily care of teeth and gums, regular physical activity (goal should be 5 days a week for 30 minutes), eat a healthy diet, avoid tobacco and drug use, limiting any alcohol intake, taking a low-dose aspirin (if not allergic or have been advised by your provider otherwise) and taking vitamins and minerals as recommended by your provider. Continue doing brain stimulating activities (puzzles, reading, adult coloring books, staying active) to keep memory sharp. Continue to eat heart healthy diet (full of fruits, vegetables, whole grains, lean protein, water--limit salt, fat, and sugar intake) and increase physical activity as tolerated.  Next appointment: Please schedule your next Medicare Wellness Visit with your Nurse Health Advisor in 1 year by calling [phone].  Preventive Care 11 Years and Older, Male Preventive care refers to lifestyle choices and visits with your health care provider that can promote health and wellness. What  does preventive care include?  A yearly physical exam. This is also called an annual well check.  Dental exams once or twice a year.  Routine eye exams. Ask your health care provider how often you should have your eyes checked.  Personal lifestyle choices, including:  Daily care of your teeth and gums.  Regular physical activity.  Eating a healthy diet.  Avoiding tobacco and drug use.  Limiting alcohol use.  Practicing safe sex.  Taking low doses of aspirin every day.  Taking vitamin and mineral supplements as recommended by your health care provider. What happens during an annual well check? The services and screenings done by your health care provider during your annual well check will depend on your age, overall health, lifestyle risk factors, and family history of disease. Counseling  Your health care provider may ask you questions about your:  Alcohol use.  Tobacco use.  Drug use.  Emotional well-being.  Home and relationship well-being.  Sexual activity.  Eating habits.  History of falls.  Memory and ability to understand (cognition).  Work and work Statistician. Screening  You may have the following tests or measurements:  Height, weight, and BMI.  Blood pressure.  Lipid and cholesterol levels. These may be checked every 5 years, or more frequently if you are over 40 years old.  Skin check.  Lung cancer screening. You may have this screening every year starting at age 27 if you have a 30-pack-year history of smoking and currently smoke or have quit within the past 15 years.  Fecal occult blood test (FOBT) of the stool. You may have this test every year starting at age 50.  Flexible sigmoidoscopy or colonoscopy. You may have a sigmoidoscopy every 5 years or a colonoscopy every 10 years starting at age 49.  Prostate cancer  screening. Recommendations will vary depending on your family history and other risks.  Hepatitis C blood test.  Hepatitis  B blood test.  Sexually transmitted disease (STD) testing.  Diabetes screening. This is done by checking your blood sugar (glucose) after you have not eaten for a while (fasting). You may have this done every 1-3 years.  Abdominal aortic aneurysm (AAA) screening. You may need this if you are a current or former smoker.  Osteoporosis. You may be screened starting at age 67 if you are at high risk. Talk with your health care provider about your test results, treatment options, and if necessary, the need for more tests. Vaccines  Your health care provider may recommend certain vaccines, such as:  Influenza vaccine. This is recommended every year.  Tetanus, diphtheria, and acellular pertussis (Tdap, Td) vaccine. You may need a Td booster every 10 years.  Zoster vaccine. You may need this after age 32.  Pneumococcal 13-valent conjugate (PCV13) vaccine. One dose is recommended after age 23.  Pneumococcal polysaccharide (PPSV23) vaccine. One dose is recommended after age 56. Talk to your health care provider about which screenings and vaccines you need and how often you need them. This information is not intended to replace advice given to you by your health care provider. Make sure you discuss any questions you have with your health care provider. Document Released: 02/28/2015 Document Revised: 10/22/2015 Document Reviewed: 12/03/2014 Elsevier Interactive Patient Education  2017 [employer] Prevention in the Home Falls can cause injuries. They can happen to people of all ages. There are many things you can do to make your home safe and to help prevent falls. What can I do on the outside of my home?  Regularly fix the edges of walkways and driveways and fix any cracks.  Remove anything that might make you trip as you walk through a door, such as a raised step or threshold.  Trim any bushes or trees on the path to your home.  Use bright outdoor lighting.  Clear any walking  paths of anything that might make someone trip, such as rocks or tools.  Regularly check to see if handrails are loose or broken. Make sure that both sides of any steps have handrails.  Any raised decks and porches should have guardrails on the edges.  Have any leaves, snow, or ice cleared regularly.  Use sand or salt on walking paths during winter.  Clean up any spills in your garage right away. This includes oil or grease spills. What can I do in the bathroom?  Use night lights.  Install grab bars by the toilet and in the tub and shower. Do not use towel bars as grab bars.  Use non-skid mats or decals in the tub or shower.  If you need to sit down in the shower, use a plastic, non-slip stool.  Keep the floor dry. Clean up any water that spills on the floor as soon as it happens.  Remove soap buildup in the tub or shower regularly.  Attach bath mats securely with double-sided non-slip rug tape.  Do not have throw rugs and other things on the floor that can make you trip. What can I do in the bedroom?  Use night lights.  Make sure that you have a light by your bed that is easy to reach.  Do not use any sheets or blankets that are too big for your bed. They should not hang down onto the floor.  Have a firm  chair that has side arms. You can use this for support while you get dressed.  Do not have throw rugs and other things on the floor that can make you trip. What can I do in the kitchen?  Clean up any spills right away.  Avoid walking on wet floors.  Keep items that you use a lot in easy-to-reach places.  If you need to reach something above you, use a strong step stool that has a grab bar.  Keep electrical cords out of the way.  Do not use floor polish or wax that makes floors slippery. If you must use wax, use non-skid floor wax.  Do not have throw rugs and other things on the floor that can make you trip. What can I do with my stairs?  Do not leave any items  on the stairs.  Make sure that there are handrails on both sides of the stairs and use them. Fix handrails that are broken or loose. Make sure that handrails are as long as the stairways.  Check any carpeting to make sure that it is firmly attached to the stairs. Fix any carpet that is loose or worn.  Avoid having throw rugs at the top or bottom of the stairs. If you do have throw rugs, attach them to the floor with carpet tape.  Make sure that you have a light switch at the top of the stairs and the bottom of the stairs. If you do not have them, ask someone to add them for you. What else can I do to help prevent falls?  Wear shoes that:  Do not have high heels.  Have rubber bottoms.  Are comfortable and fit you well.  Are closed at the toe. Do not wear sandals.  If you use a stepladder:  Make sure that it is fully opened. Do not climb a closed stepladder.  Make sure that both sides of the stepladder are locked into place.  Ask someone to hold it for you, if possible.  Clearly mark and make sure that you can see:  Any grab bars or handrails.  First and last steps.  Where the edge of each step is.  Use tools that help you move around (mobility aids) if they are needed. These include:  Canes.  Walkers.  Scooters.  Crutches.  Turn on the lights when you go into a dark area. Replace any light bulbs as soon as they burn out.  Set up your furniture so you have a clear path. Avoid moving your furniture around.  If any of your floors are uneven, fix them.  If there are any pets around you, be aware of where they are.  Review your medicines with your doctor. Some medicines can make you feel dizzy. This can increase your chance of falling. Ask your doctor what other things that you can do to help prevent falls. This information is not intended to replace advice given to you by your health care provider. Make sure you discuss any questions you have with your health care  provider. Document Released: 11/28/2008 Document Revised: 07/10/2015 Document Reviewed: 03/08/2014 Elsevier Interactive Patient Education  2017 [employer].

## 2019-11-14 DIAGNOSIS — N401 Enlarged prostate with lower urinary tract symptoms: Secondary | ICD-10-CM | POA: Diagnosis not present

## 2019-11-15 IMAGING — CT CT HEART SCORING
2 series · 16 of 20 positions shown, 18 images · non-contrast
Comparison: None.

Addendum:
CLINICAL DATA: Risk stratification

EXAM:
Coronary Calcium Score
MEDICATIONS:
None
TECHNIQUE: The patient was scanned on a Siemens Force scanner. Axial
non-contrast 3 mm slices were carried out through the heart. The
data set was analyzed on a dedicated work station and scored using
the Agatson method.

[Series 2: casc 3.0 i36f 2 bestdiast 68 % · axial · 0.36mm/px · z∈[-224,-128]mm · 8 of 42 slices shown, 10 images]
[im 5/42  vessel]
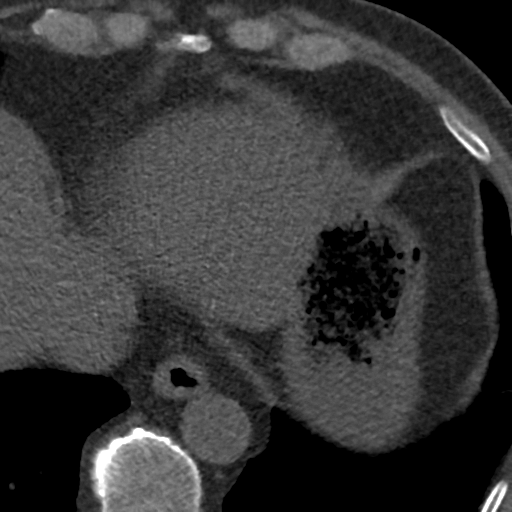
[im 5/42  lung]
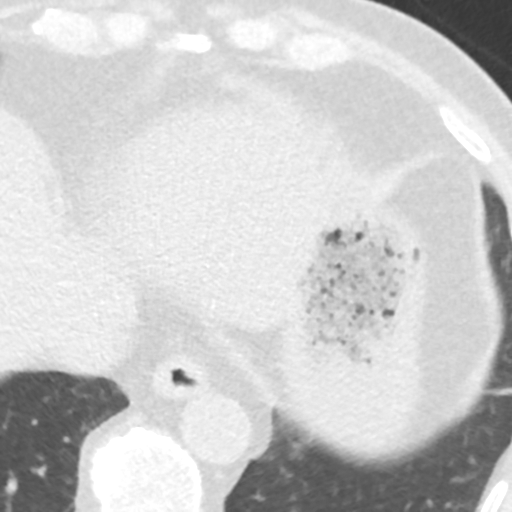
[im 10/42  vessel]
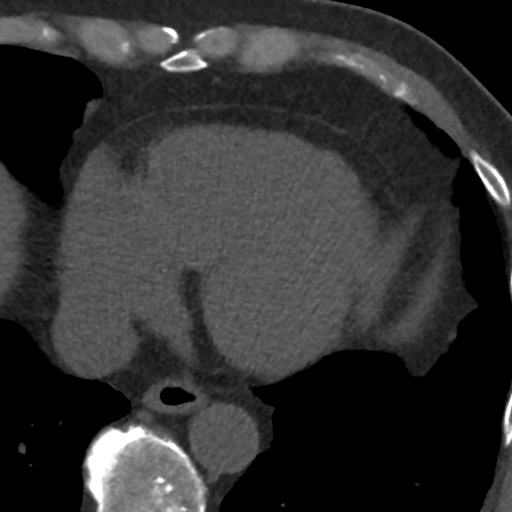
[im 14/42  vessel]
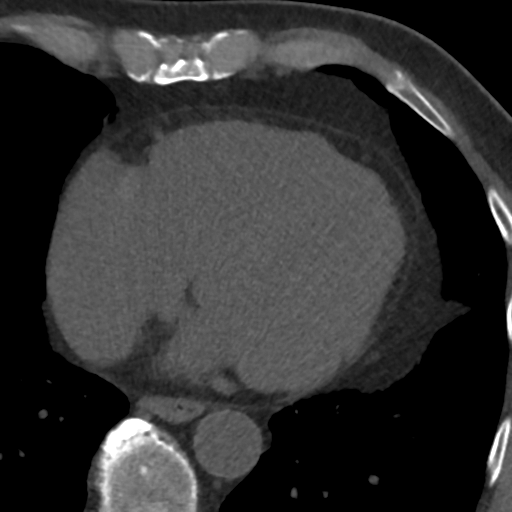
[im 19/42  vessel]
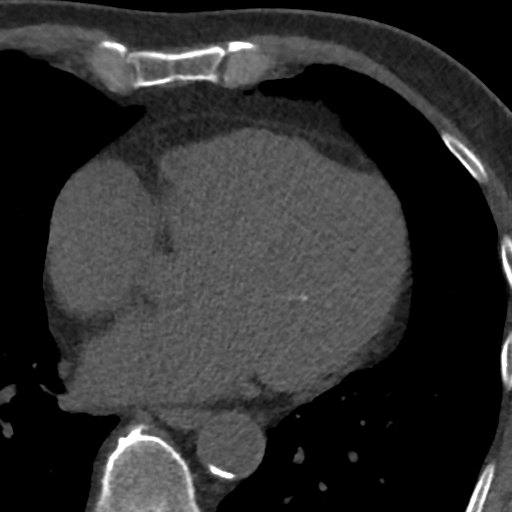
[im 23/42  vessel]
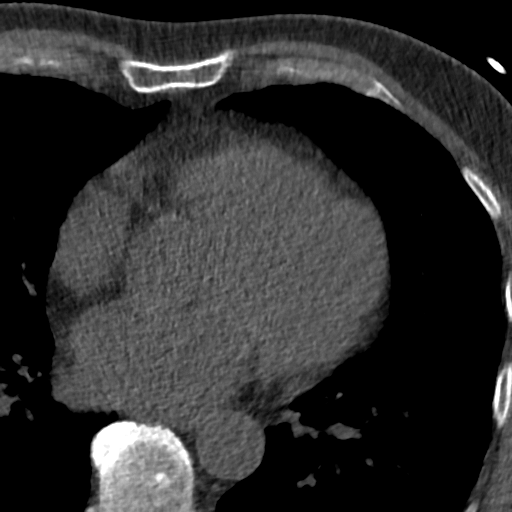
[im 23/42  lung]
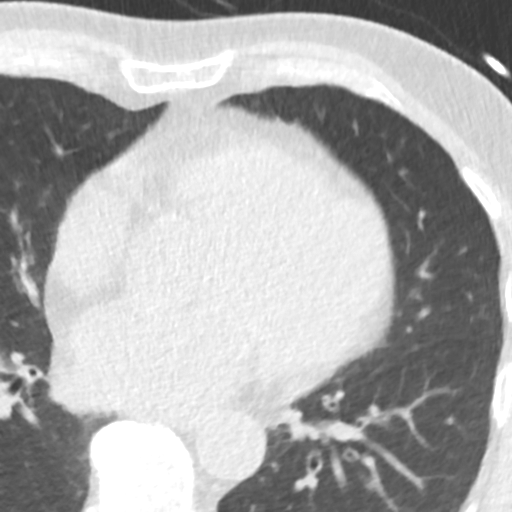
[im 28/42  vessel]
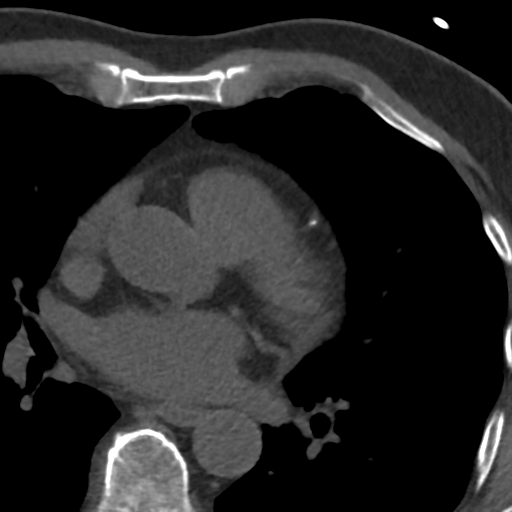
[im 32/42  vessel]
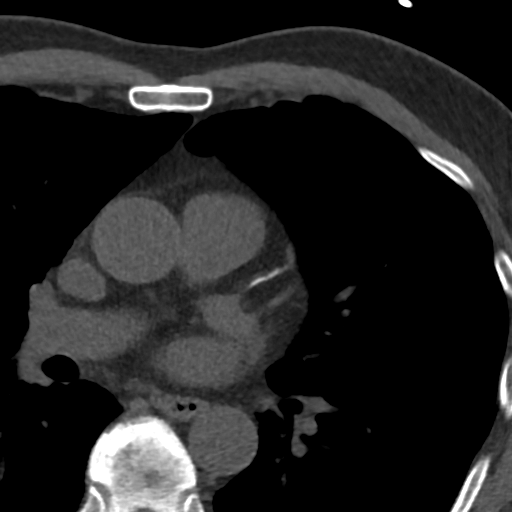
[im 37/42  vessel]
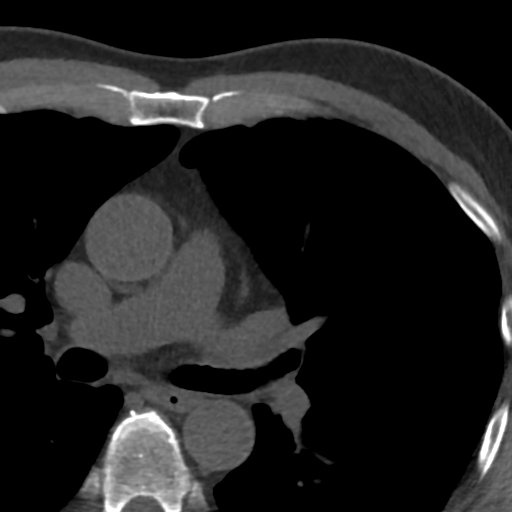

[Series 4: lung st 70 % · axial · 0.66mm/px · z∈[-224,-128]mm · 8 of 42 slices shown]
[im 5/42  lung]
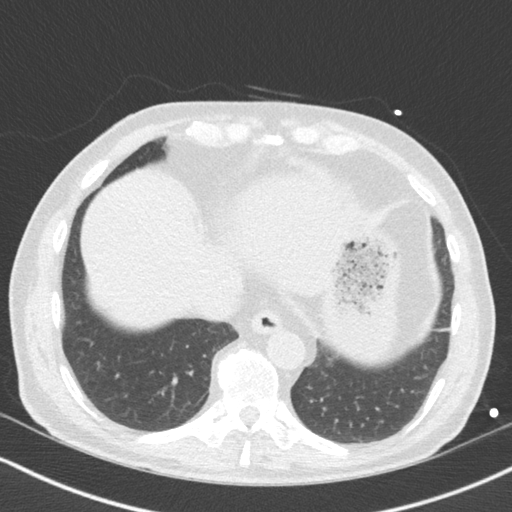
[im 10/42  lung]
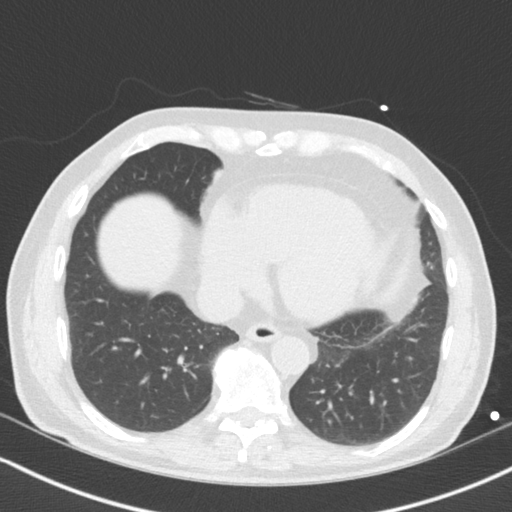
[im 14/42  lung]
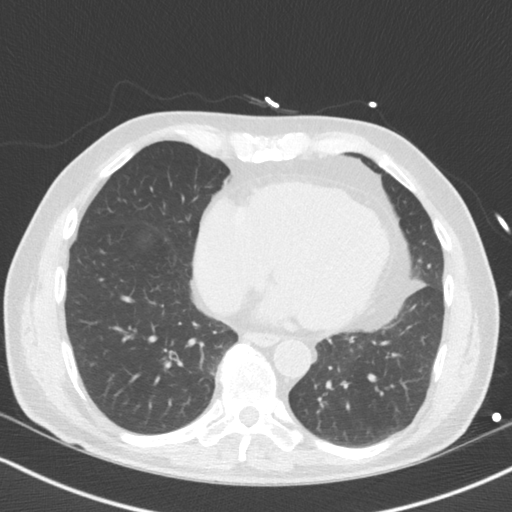
[im 19/42  lung]
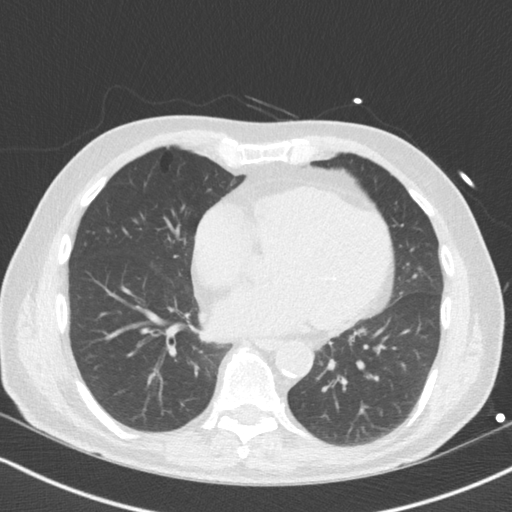
[im 23/42  lung]
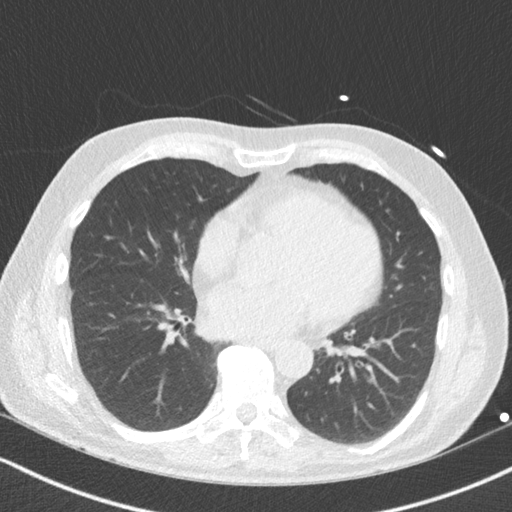
[im 28/42  lung]
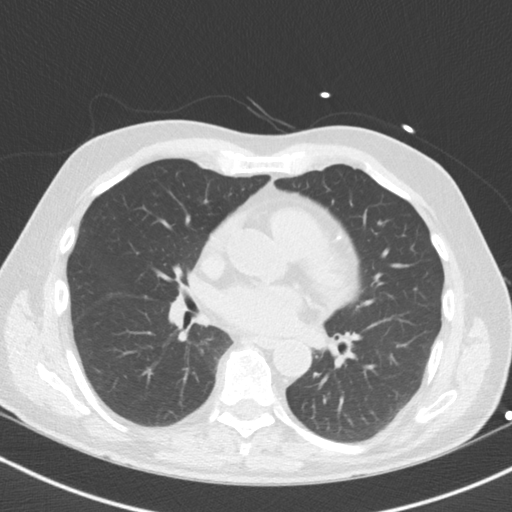
[im 32/42  lung]
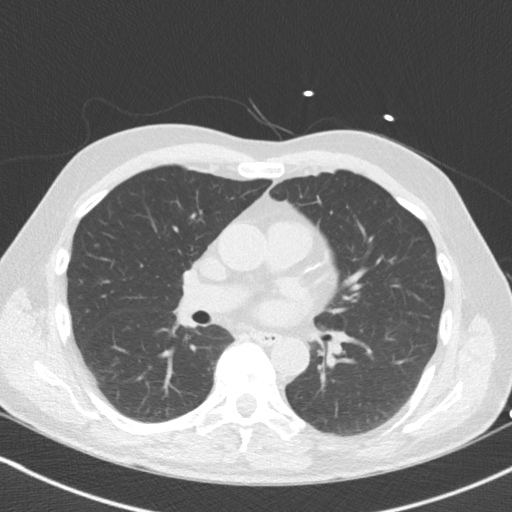
[im 37/42  lung]
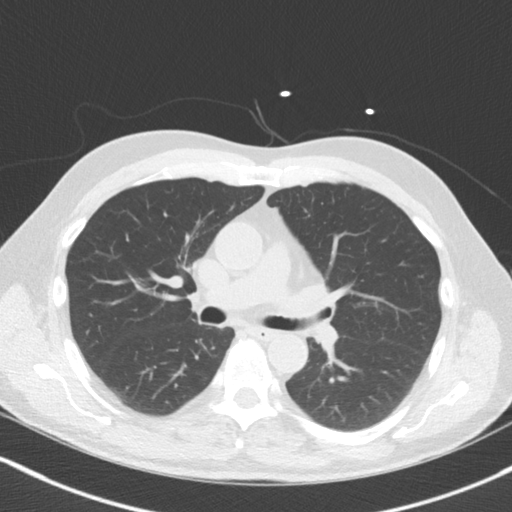

[16 of 20 positions shown; findings below may reference images not displayed]

FINDINGS: Non-cardiac: See separate report from [REDACTED].

Ascending Aorta: Normal size, minimal calcifications.

Pericardium: Normal.

Coronary arteries: Normal origin.
IMPRESSION: Coronary calcium score of 273. This was 48 percentile for age and
sex matched control.

EXAM:
OVER-READ INTERPRETATION  CT CHEST

The following report is an over-read performed by radiologist Dr.
Karel Dios [REDACTED] on 12/04/2018. This
over-read does not include interpretation of cardiac or coronary
anatomy or pathology. The interpretation by the cardiologist is
attached.
FINDINGS: Atherosclerotic calcification of the aorta. Heart is at the upper
limits of normal in size. No pericardial or pleural effusion.
Calcified granuloma in the right lower lobe. Visualized portions of
the lungs are otherwise clear. Visualized portions of the upper
abdomen and bones are unremarkable.
IMPRESSION: Aortic atherosclerosis ([id number]-170.0).

*** End of Addendum ***
FINDINGS: Non-cardiac: See separate report from [REDACTED].

Ascending Aorta: Normal size, minimal calcifications.

Pericardium: Normal.

Coronary arteries: Normal origin.
IMPRESSION: Coronary calcium score of 273. This was 48 percentile for age and
sex matched control.

## 2019-11-21 DIAGNOSIS — N401 Enlarged prostate with lower urinary tract symptoms: Secondary | ICD-10-CM | POA: Diagnosis not present

## 2019-11-21 DIAGNOSIS — R351 Nocturia: Secondary | ICD-10-CM | POA: Diagnosis not present

## 2019-11-21 DIAGNOSIS — E291 Testicular hypofunction: Secondary | ICD-10-CM | POA: Diagnosis not present

## 2019-11-28 ENCOUNTER — Telehealth: Payer: Self-pay | Admitting: Cardiology

## 2019-11-28 NOTE — Telephone Encounter (Signed)
Pt c/o medication issue:  1. Name of Medication:   aspirin EC 81 MG   2. How are you currently taking this medication (dosage and times per day)?   As directed, one tablet daily   3. Are you having a reaction (difficulty breathing--STAT)?   No  4. What is your medication issue?   Had a physical recently and hemoglobin was low, was told to recheck in 3 months. Saw commercial about aspirin that brought up some questions as to if it is the cause of the low hemoglobin. Please call.

## 2019-11-28 NOTE — Telephone Encounter (Signed)
Attempted phone call to pt.  Left voicemail message to contact triage RN at [phone].  

## 2019-11-29 NOTE — Telephone Encounter (Signed)
Spoke with pt's wife, DPR who reports pt had PE with PCP and blood work shows hgb decreased to 12.5.  PCP planning to repeat in 3 months but pt and wife are concerned about continuing ASA due to the news report yesterday re: ASA.  Pt's wife advised will forward to Dr Radford Pax for review and recommendation.  Pt's wife verbalizes understanding, agrees with current plan and thanked Therapist, sports for returning call.

## 2019-11-29 NOTE — Telephone Encounter (Signed)
[name] Browning is calling stating she never received a callback. Please advise.

## 2019-12-01 NOTE — Telephone Encounter (Signed)
Ok to hold ASA for now until cause of low Hbg evaluated

## 2019-12-03 NOTE — Telephone Encounter (Signed)
Sueanne Margarita, MD       Ok to hold ASA for now until cause of low Hbg evaluated     Called the patient and made her aware of the above recommendations from Dr. Radford Pax.

## 2019-12-03 NOTE — Telephone Encounter (Signed)
Spoke with pt's wife, DPR and advised per Dr Radford Pax hold ASA until cause of low Hgb identified.  Pt's wife verbalized understanding and thanked Therapist, sports for call.

## 2020-01-14 ENCOUNTER — Other Ambulatory Visit: Payer: Self-pay | Admitting: Cardiology

## 2020-01-23 ENCOUNTER — Telehealth: Payer: Self-pay | Admitting: Internal Medicine

## 2020-01-23 DIAGNOSIS — D638 Anemia in other chronic diseases classified elsewhere: Secondary | ICD-10-CM

## 2020-01-23 NOTE — Telephone Encounter (Signed)
   Patient wants to have labs drawn 12/10 Spouse states patient has stopped aspirin Requesting to have [place] checked

## 2020-01-25 ENCOUNTER — Other Ambulatory Visit (INDEPENDENT_AMBULATORY_CARE_PROVIDER_SITE_OTHER): Payer: Medicare Other

## 2020-01-25 DIAGNOSIS — D638 Anemia in other chronic diseases classified elsewhere: Secondary | ICD-10-CM

## 2020-01-25 LAB — CBC WITH DIFFERENTIAL/PLATELET
Basophils Absolute: 0 10*3/uL (ref 0.0–0.1)
Basophils Relative: 0.4 % (ref 0.0–3.0)
Eosinophils Absolute: 0.2 10*3/uL (ref 0.0–0.7)
Eosinophils Relative: 3.2 % (ref 0.0–5.0)
HCT: 36.6 % — ABNORMAL LOW (ref 39.0–52.0)
Hemoglobin: 12 g/dL — ABNORMAL LOW (ref 13.0–17.0)
Lymphocytes Relative: 31.4 % (ref 12.0–46.0)
Lymphs Abs: 1.7 10*3/uL (ref 0.7–4.0)
MCHC: 32.8 g/dL (ref 30.0–36.0)
MCV: 91.4 fl (ref 78.0–100.0)
Monocytes Absolute: 0.6 10*3/uL (ref 0.1–1.0)
Monocytes Relative: 11.2 % (ref 3.0–12.0)
Neutro Abs: 2.8 10*3/uL (ref 1.4–7.7)
Neutrophils Relative %: 53.8 % (ref 43.0–77.0)
Platelets: 305 10*3/uL (ref 150.0–400.0)
RBC: 4.01 Mil/uL — ABNORMAL LOW (ref 4.22–5.81)
RDW: 12.9 % (ref 11.5–15.5)
WBC: 5.3 10*3/uL (ref 4.0–10.5)

## 2020-01-25 NOTE — Addendum Note (Signed)
Addended by: Trenda Moots on: [id number]/7366 08:54 AM   Modules accepted: Orders

## 2020-01-28 ENCOUNTER — Other Ambulatory Visit: Payer: Self-pay

## 2020-01-28 ENCOUNTER — Ambulatory Visit: Payer: Medicare Other | Admitting: Cardiology

## 2020-01-28 NOTE — Telephone Encounter (Signed)
Please call patient with lab results

## 2020-01-29 ENCOUNTER — Ambulatory Visit (INDEPENDENT_AMBULATORY_CARE_PROVIDER_SITE_OTHER): Payer: Medicare Other | Admitting: Cardiology

## 2020-01-29 ENCOUNTER — Encounter: Payer: Self-pay | Admitting: Cardiology

## 2020-01-29 VITALS — BP 150/70 | HR 79 | Ht 66.0 in | Wt 150.8 lb

## 2020-01-29 DIAGNOSIS — I10 Essential (primary) hypertension: Secondary | ICD-10-CM

## 2020-01-29 DIAGNOSIS — I2510 Atherosclerotic heart disease of native coronary artery without angina pectoris: Secondary | ICD-10-CM | POA: Diagnosis not present

## 2020-01-29 DIAGNOSIS — E7800 Pure hypercholesterolemia, unspecified: Secondary | ICD-10-CM

## 2020-01-29 NOTE — Patient Instructions (Signed)
Please take your blood pressure daily for one week and call us or send a MyChart message with your readings.   Medication Instructions:  Your physician recommends that you continue on your current medications as directed. Please refer to the Current Medication list given to you today.  *If you need a refill on your cardiac medications before your next appointment, please call your pharmacy*  Follow-Up: At [facility], you and your health needs are our priority.  As part of our continuing mission to provide you with exceptional heart care, we have created designated Provider Care Teams.  These Care Teams include your primary Cardiologist (physician) and Advanced Practice Providers (APPs -  Physician Assistants and Nurse Practitioners) who all work together to provide you with the care you need, when you need it.  Your next appointment:   1 year(s)  The format for your next appointment:   In Person  Provider:   You may see Fransico Him, MD or one of the following Advanced Practice Providers on your designated Care Team:    Melina Copa, PA-C  Ermalinda Barrios, PA-C

## 2020-01-29 NOTE — Progress Notes (Signed)
Cardiology Consult  Note    Date:  01/29/2020   ID:  [name] Browning, DOB 1940/03/04, MRN [id number]  PCP:  Cassandria Anger, MD  Cardiologist:  Fransico Him, MD   Chief Complaint  Patient presents with  . Follow-up    Coronary calcifications, HTN, HLD    History of Present Illness:  [name] Browning is a 79 y.o. male  with a hx of asthma, remote aflutter s/p RFCA 2013, HTN and HLD.  He had a heart cath done in 1990's that was normal but noted to have coronary Ca with a score of 273 on coronary Ca screening as well as aortic atherosclerosis in 11/2018 .   Lexiscan myoview showed no ischemia. 2D echo showed normal LVF.  He is statin intolerant and is now on Repatha and Zetia.   He is here today for followup and is doing well.  He denies any chest pain or pressure, SOB, DOE, PND, orthopnea, LE edema, dizziness, palpitations or syncope. He is compliant with his meds and is tolerating meds with no SE.  He plays golf and walks for exercise.   Past Medical History:  Diagnosis Date  . Acute bronchitis 08/11/2016   6/18  H/o asthma as a child...  . ADD (attention deficit disorder)   . Anemia in chronic illness 06/15/2013   Followed as Primary Care Patient/ [facility]/ [place] - Fe studies ok 06/15/13 > stool cards received 06/27/2013 > neg x 5> down to 11.4 08/23/13  > referred for hematology 09/04/2013> Alvy Bimler felt it was due to low Testosterone  Resolved as of 08/14/2015        . Anxiety   . Asthma    AS A CHILD  . Ataxia 06/07/2016   2018 poss age related/meds  . Atrial flutter ([place])    a. s/p RFCA 07/2011.  Xarelto d/c 08/2011.  . Bladder neck obstruction 10/04/2016   Dr Karsten Ro Rapaflo  . Cataracts, bilateral   . Colon polyps 10/04/2016   Dr Ardis Hughs Due colon 02/2019  . Conjunctivitis 09/14/2013  . Cough 02/19/2014   DgEs   01/23/16 Unremarkable esophagram. Allergy profile 04/05/2016 >  Eos 0.4 /  IgE  284  RAST pos grass/trees/dust > rec allergy eval   . Depression   . Diarrhea  02/11/2017  . Disturbance in sleep behavior 04/30/2010    Trazodone  Lorazepam prn  . DIZZINESS 01/24/2008   Qualifier: Diagnosis of  By: Royal Piedra NP, Tammy    . DJD (degenerative joint disease) of knee   . Drug-induced memory loss ([place]) 02/22/2014   D/c statin 01/2014 > improved  D/c Trintellix Dr Tish Frederickson  . Dyspnea 06/18/2011  . Dysrhythmia    hx a-flutter with successful ablation 2013  . Essential hypertension 06/18/2011   NAS diet ASA - unable to use due to h/o AVM bleeding - colon 2003  . FATIGUE, ACUTE 03/23/2007   Qualifier: Diagnosis of  By: Melvyn Novas MD, Christena Deem   . GERD (gastroesophageal reflux disease)   . Heart murmur    a. 06/2011 Echo: 65-70%, mild MR  . History of lower GI bleeding 09/26/2012   Followed in GI clinic/ [facility]/ [facility]  - colonoscopy 05/22/2001  And 06/14/11 > c/w hemorrhoids and cecal avm   . Hyperlipidemia   . Hyperlipidemia LDL goal <130 03/22/2007   Followed as Primary Care Patient/ [facility]/ [place]   Zetia   . S/P cardiac cath    a. in 1990's - Dr. Gwenlyn Found - WNL  .  Scalp laceration 11/20/2014   11/01/14 > staples removed 11/18/2014    . Sleep apnea    could not tolerate cpap, uses mouth guard now  . TESTICULAR HYPOFUNCTION 09/15/2009   F/u urology on testosterone gel      Past Surgical History:  Procedure Laterality Date  . ATRIAL FLUTTER ABLATION N/A 08/06/2011   Procedure: ATRIAL FLUTTER ABLATION;  Surgeon: Deboraha Sprang, MD;  Location: [facility] C[facility] LAB;  Service: Cardiovascular;  Laterality: N/A;  . COLONOSCOPY  02/27/2016  . LESION REMOVAL N/A 08/02/2014   Procedure: EXCISION UPPER LIP VASCULAR ANOMALY ;  Surgeon: Irene Limbo, MD;  Location: [facility];  Service: Plastics;  Laterality: N/A;  . MASTOIDECTOMY    . POLYPECTOMY    . TONSILLECTOMY    . WRIST FRACTURE SURGERY  2007   left    Current Medications: Current Meds  Medication Sig  . Cholecalciferol (VITAMIN D3) 2000 units capsule Take 1 capsule (2,000 Units total)  by mouth daily.  . cyanocobalamin 1000 MCG tablet Take 1,000 mcg by mouth daily.  [name] ezetimibe (ZETIA) 10 MG tablet Take 1 tablet (10 mg total) by mouth daily.  . Multiple Vitamin tablet Take 1 tablet by mouth daily.  . Multiple Vitamins-Minerals (ZINC PO) Take 1 tablet by mouth daily.  [name] RAPAFLO 8 MG CAPS capsule Take 8 mg by mouth daily with breakfast.   . REPATHA SURECLICK 631 MG/ML SOAJ INJECT 1 PEN INTO THE SKIN EVERY 14 (FOURTEEN) DAYS.  . ST JOHNS WORT PO Take 1 tablet by mouth daily.  [name] VALERIAN PO Take 1 tablet by mouth daily.  . vitamin E 400 UNIT capsule Take 400 Units by mouth daily.    Allergies:   Crestor [rosuvastatin], Simvastatin, and Trintellix [vortioxetine]   Social History   Socioeconomic History  . Marital status: Married    Spouse name: Not on file  . Number of children: Not on file  . Years of education: Not on file  . Highest education level: Not on file  Occupational History  . 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 Smoking status: Former Smoker    Packs/day: 3.00    Years: 7.00    Pack years: 21.00    Types: Cigarettes    Start date: 1963    Quit date: 02/16/1968    Years since quitting: 51.9  . Smokeless tobacco: Never Used  Vaping Use  . Vaping Use: Never used  Substance and Sexual Activity  . Alcohol use: No  . Drug use: No  . Sexual activity: Yes  Other Topics Concern  . Not on file  Social History Narrative  . Not on file   Social Determinants of Health   Financial Resource Strain: Low Risk   . Difficulty of Paying Living Expenses: Not hard at all  Food Insecurity: No Food Insecurity  . Worried About Charity fundraiser in the Last Year: Never true  . Ran Out of Food in the Last Year: Never true  Transportation Needs: No Transportation Needs  . Lack of Transportation (Medical): No  . Lack of Transportation (Non-Medical): No  Physical Activity: Sufficiently Active  . Days of Exercise per Week: 7 days  .  Minutes of Exercise per Session: 60 min  Stress: No Stress Concern Present  . Feeling of Stress : Not at all  Social Connections: Moderately Isolated  . Frequency of Communication with Friends and Family: Never  . Frequency of Social Gatherings with Friends  and Family: Never  . Attends Religious Services: Never  . Active Member of Clubs or Organizations: Yes  . Attends Archivist Meetings: More than 4 times per year  . Marital Status: Married     Family History:  The patient's family history includes Heart disease in his mother; Hypertension in his brother; Leukemia in his mother; Prostate cancer in his brother and father; Stroke in his father.   ROS:   Please see the history of present illness.    ROS All other systems reviewed and are negative.  No flowsheet data found.     PHYSICAL EXAM:   VS:  BP (!) 150/70   Pulse 79   Ht 5\' 6"  (1.676 m)   Wt 150 lb 12.8 oz (68.4 kg)   SpO2 97%   BMI 24.34 kg/m     GEN: Well nourished, well developed in no acute distress HEENT: Normal NECK: No JVD; No carotid bruits LYMPHATICS: No lymphadenopathy CARDIAC:RRR, no murmurs, rubs, gallops RESPIRATORY:  Clear to auscultation without rales, wheezing or rhonchi  ABDOMEN: Soft, non-tender, non-distended MUSCULOSKELETAL:  No edema; No deformity  SKIN: Warm and dry NEUROLOGIC:  Alert and oriented x 3 PSYCHIATRIC:  Normal affect    Wt Readings from Last 3 Encounters:  01/29/20 150 lb 12.8 oz (68.4 kg)  10/26/19 145 lb (65.8 kg)  02/23/19 152 lb (68.9 kg)      Studies/Labs Reviewed:   EKG:  EKG is ordered today.  The ekg ordered today demonstrates NSR with nonspecific T wave abnormality unchanged from 01/2019  Recent Labs: 10/26/2019: ALT 16; BUN 20; Creat 1.07; Potassium 4.6; Sodium 144; TSH 3.45 01/25/2020: Hemoglobin 12.0; Platelets 305.0   Lipid Panel    Component Value Date/Time   CHOL 114 10/26/2019 1221   CHOL 110 04/04/2019 0816   TRIG 76 10/26/2019 1221    HDL 75 10/26/2019 1221   HDL 63 04/04/2019 0816   CHOLHDL 1.5 10/26/2019 1221   VLDL 58.8 (H) 10/12/2018 1554   LDLCALC 23 10/26/2019 1221   LDLDIRECT 152.0 10/12/2018 1554    Additional studies/ records that were reviewed today include:  Office notes from PCP    ASSESSMENT:    1. Coronary artery calcification seen on CAT scan   2. Essential hypertension   3. Pure hypercholesterolemia      PLAN:  In order of problems listed above:  1.  Coronary artery calcifications -coronary Ca score was elevated at 273 which was 48th% for age and sex matched controls.   -he is completely asymptomatic -his EKG does show some T wave inversions in the lateral precordial leads but related to repol from LVH>>normal LVF on echo -lexiscan myoview showed no ischemia 01/2019 -He no longer smokes -he has been intolerant to statins and is on Praluent -needs aggressive risk factor modification -continue ASA 81mg  daily and statin  2.  HTN -BP controlled well on exam today -I have asked him to check his BP daily for a week and call with results  3.  HLD -LDL goal < 70 -he did not tolerate simvastatin due to memory issues -given intolerance to statins, elevated LDL that needs to be < 70 and coronary Ca, he was referred to lipid clinic -now on Repatha and Zetia 10mg  daily and LDL was 23    Medication Adjustments/Labs and Tests Ordered: Current medicines are reviewed at length with the patient today.  Concerns regarding medicines are outlined above.  Medication changes, Labs and Tests ordered today are listed in  the Patient Instructions below.  There are no Patient Instructions on file for this visit.   Signed, Fransico Him, MD  01/29/2020 1:10 PM    [facility] [address], [place], [place]  [address] Phone: [phone]; Fax: [phone]

## 2020-01-29 NOTE — Telephone Encounter (Signed)
Notified pt wife w/MD response. Made appt for 02/18/20 w/labs prior to appt.Marland KitchenJohny Browning

## 2020-01-29 NOTE — Telephone Encounter (Signed)
Please inform the patient-hemoglobin is fairly stable.  However I would like to obtain another hemoglobin and iron tests in 1 month.  Please see me with the results for an appointment.

## 2020-02-13 ENCOUNTER — Other Ambulatory Visit: Payer: Self-pay

## 2020-02-13 ENCOUNTER — Other Ambulatory Visit (INDEPENDENT_AMBULATORY_CARE_PROVIDER_SITE_OTHER): Payer: Medicare Other

## 2020-02-13 DIAGNOSIS — D638 Anemia in other chronic diseases classified elsewhere: Secondary | ICD-10-CM | POA: Diagnosis not present

## 2020-02-13 LAB — CBC WITH DIFFERENTIAL/PLATELET
Basophils Absolute: 0 10*3/uL (ref 0.0–0.1)
Basophils Relative: 0.4 % (ref 0.0–3.0)
Eosinophils Absolute: 0.2 10*3/uL (ref 0.0–0.7)
Eosinophils Relative: 3.7 % (ref 0.0–5.0)
HCT: 37.1 % — ABNORMAL LOW (ref 39.0–52.0)
Hemoglobin: 12.5 g/dL — ABNORMAL LOW (ref 13.0–17.0)
Lymphocytes Relative: 28.1 % (ref 12.0–46.0)
Lymphs Abs: 1.6 10*3/uL (ref 0.7–4.0)
MCHC: 33.6 g/dL (ref 30.0–36.0)
MCV: 90.3 fl (ref 78.0–100.0)
Monocytes Absolute: 0.6 10*3/uL (ref 0.1–1.0)
Monocytes Relative: 10.8 % (ref 3.0–12.0)
Neutro Abs: 3.2 10*3/uL (ref 1.4–7.7)
Neutrophils Relative %: 57 % (ref 43.0–77.0)
Platelets: 296 10*3/uL (ref 150.0–400.0)
RBC: 4.11 Mil/uL — ABNORMAL LOW (ref 4.22–5.81)
RDW: 13.1 % (ref 11.5–15.5)
WBC: 5.6 10*3/uL (ref 4.0–10.5)

## 2020-02-14 LAB — IRON,TIBC AND FERRITIN PANEL
%SAT: 30 % (calc) (ref 20–48)
Ferritin: 62 ng/mL (ref 24–380)
Iron: 95 ug/dL (ref 50–180)
TIBC: 314 mcg/dL (calc) (ref 250–425)

## 2020-02-18 ENCOUNTER — Other Ambulatory Visit: Payer: Self-pay

## 2020-02-18 ENCOUNTER — Ambulatory Visit (INDEPENDENT_AMBULATORY_CARE_PROVIDER_SITE_OTHER): Payer: Medicare Other | Admitting: Internal Medicine

## 2020-02-18 ENCOUNTER — Encounter: Payer: Self-pay | Admitting: Internal Medicine

## 2020-02-18 DIAGNOSIS — D125 Benign neoplasm of sigmoid colon: Secondary | ICD-10-CM

## 2020-02-18 DIAGNOSIS — Z8719 Personal history of other diseases of the digestive system: Secondary | ICD-10-CM | POA: Diagnosis not present

## 2020-02-18 DIAGNOSIS — E7800 Pure hypercholesterolemia, unspecified: Secondary | ICD-10-CM

## 2020-02-18 DIAGNOSIS — D638 Anemia in other chronic diseases classified elsewhere: Secondary | ICD-10-CM | POA: Diagnosis not present

## 2020-02-18 DIAGNOSIS — F1996 Other psychoactive substance use, unspecified with psychoactive substance-induced persisting amnestic disorder: Secondary | ICD-10-CM

## 2020-02-18 MED ORDER — FAMOTIDINE 20 MG PO TABS: 20.0000 mg | ORAL_TABLET | Freq: Two times a day (BID) | ORAL | 3 refills | Status: DC

## 2020-02-18 NOTE — Assessment & Plan Note (Signed)
No rectal bleeding

## 2020-02-18 NOTE — Progress Notes (Signed)
Subjective:  Patient ID: [name] Browning, male    DOB: 05/26/1940  Age: 80 y.o. MRN: [id number]  CC: Follow-up (F/u on labs)   HPI [name] Browning presents for anemia, HTN SBP 130-135 at home  Outpatient Medications Prior to Visit  Medication Sig Dispense Refill  . Cholecalciferol (VITAMIN D3) 2000 units capsule Take 1 capsule (2,000 Units total) by mouth daily. 100 capsule 3  . cyanocobalamin 1000 MCG tablet Take 1,000 mcg by mouth daily.    [name] ezetimibe (ZETIA) 10 MG tablet Take 1 tablet (10 mg total) by mouth daily. 90 tablet 3  . Multiple Vitamin tablet Take 1 tablet by mouth daily.    . Multiple Vitamins-Minerals (ZINC PO) Take 1 tablet by mouth daily.    [name] RAPAFLO 8 MG CAPS capsule Take 8 mg by mouth daily with breakfast.     . REPATHA SURECLICK 140 MG/ML SOAJ INJECT 1 PEN INTO THE SKIN EVERY 14 (FOURTEEN) DAYS. 6 mL 3  . ST JOHNS WORT PO Take 1 tablet by mouth daily.    [name] VALERIAN PO Take 1 tablet by mouth daily.    . vitamin E 400 UNIT capsule Take 400 Units by mouth daily.     No facility-administered medications prior to visit.    ROS: Review of Systems  Constitutional: Negative for appetite change, fatigue and unexpected weight change.  HENT: Negative for congestion, nosebleeds, sneezing, sore throat and trouble swallowing.   Eyes: Negative for itching and visual disturbance.  Respiratory: Negative for cough.   Cardiovascular: Negative for chest pain, palpitations and leg swelling.  Gastrointestinal: Negative for abdominal distention, blood in stool, diarrhea and nausea.  Genitourinary: Negative for frequency and hematuria.  Musculoskeletal: Negative for back pain, gait problem, joint swelling and neck pain.  Skin: Negative for rash.  Neurological: Negative for dizziness, tremors, speech difficulty and weakness.  Psychiatric/Behavioral: Negative for agitation, dysphoric mood and sleep disturbance. The patient is not nervous/anxious.     Objective:  BP (!)  142/80 (BP Location: Left Arm)   Pulse 62   Temp 98 F (36.7 C) (Oral)   Wt 149 lb 6.4 oz (67.8 kg)   SpO2 98%   BMI 24.11 kg/m   BP Readings from Last 3 Encounters:  02/18/20 (!) 142/80  01/29/20 (!) 150/70  10/26/19 128/76    Wt Readings from Last 3 Encounters:  02/18/20 149 lb 6.4 oz (67.8 kg)  01/29/20 150 lb 12.8 oz (68.4 kg)  10/26/19 145 lb (65.8 kg)    Physical Exam Constitutional:      General: He is not in acute distress.    Appearance: He is well-developed.     Comments: NAD  HENT:     Mouth/Throat:     Mouth: Oropharynx is clear and moist.  Eyes:     Conjunctiva/sclera: Conjunctivae normal.     Pupils: Pupils are equal, round, and reactive to light.  Neck:     Thyroid: No thyromegaly.     Vascular: No JVD.  Cardiovascular:     Rate and Rhythm: Normal rate and regular rhythm.     Pulses: Intact distal pulses.     Heart sounds: Normal heart sounds. No murmur heard. No friction rub. No gallop.   Pulmonary:     Effort: Pulmonary effort is normal. No respiratory distress.     Breath sounds: Normal breath sounds. No wheezing or rales.  Chest:     Chest wall: No tenderness.  Abdominal:     General: Bowel  sounds are normal. There is no distension.     Palpations: Abdomen is soft. There is no mass.     Tenderness: There is no abdominal tenderness. There is no guarding or rebound.  Musculoskeletal:        General: No tenderness or edema. Normal range of motion.     Cervical back: Normal range of motion.  Lymphadenopathy:     Cervical: No cervical adenopathy.  Skin:    General: Skin is warm and dry.     Findings: No rash.  Neurological:     Mental Status: He is alert and oriented to person, place, and time.     Cranial Nerves: No cranial nerve deficit.     Motor: No abnormal muscle tone.     Coordination: He displays a negative Romberg sign. Coordination normal.     Gait: Gait normal.     Deep Tendon Reflexes: Reflexes are normal and symmetric.   Psychiatric:        Mood and Affect: Mood and affect normal.        Behavior: Behavior normal.        Thought Content: Thought content normal.        Judgment: Judgment normal.     Lab Results  Component Value Date   WBC 5.6 02/13/2020   HGB 12.5 (L) 02/13/2020   HCT 37.1 (L) 02/13/2020   PLT 296.0 02/13/2020   GLUCOSE 97 10/26/2019   CHOL 114 10/26/2019   TRIG 76 10/26/2019   HDL 75 10/26/2019   LDLDIRECT 152.0 10/12/2018   LDLCALC 23 10/26/2019   ALT 16 10/26/2019   AST 18 10/26/2019   NA 144 10/26/2019   K 4.6 10/26/2019   CL 106 10/26/2019   CREATININE 1.07 10/26/2019   BUN 20 10/26/2019   CO2 28 10/26/2019   TSH 3.45 10/26/2019   PSA 0.5 10/26/2019   HGBA1C 5.8 (H) 05/19/2016    CT CARDIAC SCORING  Addendum Date: 12/04/2018   ADDENDUM REPORT: 12/04/2018 16:39 EXAM: OVER-READ INTERPRETATION  CT CHEST The following report is an over-read performed by radiologist Dr. Fonnie Birkenhead [facility] on 12/04/2018. This over-read does not include interpretation of cardiac or coronary anatomy or pathology. The interpretation by the cardiologist is attached. COMPARISON:  None. FINDINGS: Atherosclerotic calcification of the aorta. Heart is at the upper limits of normal in size. No pericardial or pleural effusion. Calcified granuloma in the right lower lobe. Visualized portions of the lungs are otherwise clear. Visualized portions of the upper abdomen and bones are unremarkable. IMPRESSION: Aortic atherosclerosis (ICD10-170.0). Electronically Signed   By: Lorin Picket M.D.   On: 12/04/2018 16:39   Result Date: 12/04/2018 CLINICAL DATA:  Risk stratification EXAM: Coronary Calcium Score MEDICATIONS: None TECHNIQUE: The patient was scanned on a [employer]. Axial non-contrast 3 mm slices were carried out through the heart. The data set was analyzed on a dedicated work station and scored using the [place]. FINDINGS: Non-cardiac: See separate report from  [facility]. Ascending Aorta: Normal size, minimal calcifications. Pericardium: Normal. Coronary arteries: Normal origin. IMPRESSION: Coronary calcium score of 273. This was 49 percentile for age and sex matched control. Electronically Signed: By: Ena Dawley On: 12/04/2018 13:17    Assessment & Plan:    Walker Kehr, MD

## 2020-02-18 NOTE — Assessment & Plan Note (Addendum)
H/o anemia of chronic disease. There is a question of iron def. Dr Mayford Knife wants Rosanne Ashing to take ASA. Will obtain GI consult to see if EGD is warranted. Empiric Pepcid

## 2020-02-20 ENCOUNTER — Encounter: Payer: Self-pay | Admitting: Gastroenterology

## 2020-02-20 ENCOUNTER — Ambulatory Visit (INDEPENDENT_AMBULATORY_CARE_PROVIDER_SITE_OTHER): Payer: Medicare Other | Admitting: Gastroenterology

## 2020-02-20 VITALS — BP 146/72 | HR 67 | Ht 66.0 in | Wt 150.0 lb

## 2020-02-20 DIAGNOSIS — D649 Anemia, unspecified: Secondary | ICD-10-CM

## 2020-02-20 NOTE — Patient Instructions (Signed)
If you are age 79 or older, your body mass index should be between 23-30. Your Body mass index is 24.21 kg/m. If this is out of the aforementioned range listed, please consider follow up with your Primary Care Provider.  Follow the instructions on the Hemoccult cards and mail them back to [place] when you are finished or you may take them directly to the lab in the basement of the [facility] building. We will call you with the results.   Thank you for entrusting me with your care and choosing [facility].  Dr Christella Hartigan

## 2020-02-20 NOTE — Progress Notes (Signed)
Review of pertinent gastrointestinal problems: 1. colonoscopy with Dr. Verl Blalock April 2013 for routine risk screening. Dr. Sharlett Iles documented some nonbleeding small cecal AVMs. The examination was otherwise normal and he recommended no further colon cancer screening given his age. 2. Diarrhea evaluation 2017, Dr. Ardis Hughs bloodwork 07/2015: cbc, cmet, tsh all normal, FOBT +;  Colonoscopy 11/2015: normal TI, biopsies randomly of colon were normal; large TA in descending (3.5cm) removed piecemeal resection.  Recommended recall at 3-6 months.  Colonoscopy January 2018 Dr. Ardis Hughs found a single 2 mm adenoma in the ascending colon.  The site of the previous large polyp removal and no residual overt polyp that was biopsied to be certain, pathology of this site showed no neoplastic tissue.  Colonoscopy January 2021 Dr. Ardis Hughs showed no new polyps, the site of 2017 large polyp piecemeal resection was easily located and was again clear of obvious adenomatous tissue.   HPI: This is a very pleasant 80 year old man whom I last saw about 1 year ago the time of a colonoscopy.  He is sent here today at the request of his primary care physician to discuss a new issue, anemia.  He has not been seeing any overt GI bleeding.  Specifically no overt hematemesis, no bright red blood per rectum, no melenic-like stools.  His weight is stable, he has no nausea vomiting or abdominal pains.  He eats well and his bowels are moving normally.  He plays golf 3 times a week, he walks daily.  He does not get heartburn.  He does not have dysphagia.  He is here with his wife today  Old Data Reviewed: Blood work over the past several months showed slightly low hemoglobin.  1 year ago his hemoglobin was 13 and in early December his hemoglobin was 12.  Normocytic.  His iron testing was all normal.  Repeat hemoglobin last week was 12.5.   Review of systems: Pertinent positive and negative review of systems were noted in the above  HPI section. All other review negative.   Past Medical History:  Diagnosis Date  . Acute bronchitis 08/11/2016   6/18  H/o asthma as a child...  . ADD (attention deficit disorder)   . Anemia in chronic illness 06/15/2013   Followed as Primary Care Patient/ [facility]/ [facility] - Fe studies ok 06/15/13 > stool cards received 06/27/2013 > neg x 5> down to 11.4 08/23/13  > referred for hematology 09/04/2013> Alvy Bimler felt it was due to low Testosterone  Resolved as of 08/14/2015        . Anxiety   . Asthma    AS A CHILD  . Ataxia 06/07/2016   2018 poss age related/meds  . Atrial flutter ([place])    a. s/p RFCA 07/2011.  Xarelto d/c 08/2011.  . Bladder neck obstruction 10/04/2016   Dr Karsten Ro Rapaflo  . Cataracts, bilateral   . Colon polyps 10/04/2016   Dr Ardis Hughs Due colon 02/2019  . Conjunctivitis 09/14/2013  . Cough 02/19/2014   DgEs   01/23/16 Unremarkable esophagram. Allergy profile 04/05/2016 >  Eos 0.4 /  IgE  284  RAST pos grass/trees/dust > rec allergy eval   . Depression   . Diarrhea 02/11/2017  . Disturbance in sleep behavior 04/30/2010    Trazodone  Lorazepam prn  . DIZZINESS 01/24/2008   Qualifier: Diagnosis of  By: Royal Piedra NP, Tammy    . DJD (degenerative joint disease) of knee   . Drug-induced memory loss ([place]) 02/22/2014   D/c statin 01/2014 > improved  D/c Trintellix Dr Danae Orleans  . Dyspnea 06/18/2011  . Dysrhythmia    hx a-flutter with successful ablation 2013  . Essential hypertension 06/18/2011   NAS diet ASA - unable to use due to h/o AVM bleeding - colon 2003  . FATIGUE, ACUTE 03/23/2007   Qualifier: Diagnosis of  By: Sherene Sires MD, Charlaine Dalton   . GERD (gastroesophageal reflux disease)   . Heart murmur    a. 06/2011 Echo: 65-70%, mild MR  . History of lower GI bleeding 09/26/2012   Followed in GI clinic/ [facility]/ Jarold Motto  - colonoscopy 05/22/2001  And 06/14/11 > c/w hemorrhoids and cecal avm   . Hyperlipidemia   . Hyperlipidemia LDL goal <130 03/22/2007   Followed as Primary Care  Patient/ [facility]/ Wert   Zetia   . S/P cardiac cath    a. in 1990's - Dr. Allyson Sabal - WNL  . Scalp laceration 11/20/2014   11/01/14 > staples removed 11/18/2014    . Sleep apnea    could not tolerate cpap, uses mouth guard now  . TESTICULAR HYPOFUNCTION 09/15/2009   F/u urology on testosterone gel      Past Surgical History:  Procedure Laterality Date  . ATRIAL FLUTTER ABLATION N/A 08/06/2011   Procedure: ATRIAL FLUTTER ABLATION;  Surgeon: Duke Salvia, MD;  Location: [facility] C[facility] LAB;  Service: Cardiovascular;  Laterality: N/A;  . COLONOSCOPY  02/27/2016  . LESION REMOVAL N/A 08/02/2014   Procedure: EXCISION UPPER LIP VASCULAR ANOMALY ;  Surgeon: Glenna Fellows, MD;  Location: [facility];  Service: Plastics;  Laterality: N/A;  . MASTOIDECTOMY    . POLYPECTOMY    . TONSILLECTOMY    . WRIST FRACTURE SURGERY  2007   left    Current Outpatient Medications  Medication Sig Dispense Refill  . Cholecalciferol (VITAMIN D3) 2000 units capsule Take 1 capsule (2,000 Units total) by mouth daily. 100 capsule 3  . cyanocobalamin 1000 MCG tablet Take 1,000 mcg by mouth daily.    [name] ezetimibe (ZETIA) 10 MG tablet Take 1 tablet (10 mg total) by mouth daily. 90 tablet 3  . Multiple Vitamin tablet Take 1 tablet by mouth daily.    . Multiple Vitamins-Minerals (ZINC PO) Take 1 tablet by mouth daily.    [name] RAPAFLO 8 MG CAPS capsule Take 8 mg by mouth daily with breakfast.     . REPATHA SURECLICK 140 MG/ML SOAJ INJECT 1 PEN INTO THE SKIN EVERY 14 (FOURTEEN) DAYS. 6 mL 3  . ST JOHNS WORT PO Take 1 tablet by mouth daily.    [name] VALERIAN PO Take 1 tablet by mouth daily.    . vitamin E 400 UNIT capsule Take 400 Units by mouth daily.    . famotidine (PEPCID) 20 MG tablet Take 1 tablet (20 mg total) by mouth 2 (two) times daily. (Patient not taking: Reported on 02/20/2020) 90 tablet 3   No current facility-administered medications for this visit.    Allergies as of 02/20/2020 - Review Complete  02/20/2020  Allergen Reaction Noted  . Crestor [rosuvastatin]  02/05/2019  . Simvastatin  10/04/2016  . Trintellix [vortioxetine]  10/04/2016    Family History  Problem Relation Age of Onset  . Heart disease Mother   . Leukemia Mother   . Prostate cancer Father   . Stroke Father   . Hypertension Brother   . Prostate cancer Brother   . Colon cancer Neg Hx   . Stomach cancer Neg Hx   . Heart attack Neg  Hx   . Esophageal cancer Neg Hx   . Rectal cancer Neg Hx   . Colon polyps Neg Hx     Social History   Socioeconomic History  . Marital status: Married    Spouse name: Not on file  . Number of children: Not on file  . Years of education: Not on file  . Highest education level: Not on file  Occupational History  . 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 Smoking status: Former Smoker    Packs/day: 3.00    Years: 7.00    Pack years: 21.00    Types: Cigarettes    Start date: 1963    Quit date: 02/16/1968    Years since quitting: 52.0  . Smokeless tobacco: Never Used  Vaping Use  . Vaping Use: Never used  Substance and Sexual Activity  . Alcohol use: No  . Drug use: No  . Sexual activity: Yes  Other Topics Concern  . Not on file  Social History Narrative  . Not on file   Social Determinants of Health   Financial Resource Strain: Low Risk   . Difficulty of Paying Living Expenses: Not hard at all  Food Insecurity: No Food Insecurity  . Worried About Charity fundraiser in the Last Year: Never true  . Ran Out of Food in the Last Year: Never true  Transportation Needs: No Transportation Needs  . Lack of Transportation (Medical): No  . Lack of Transportation (Non-Medical): No  Physical Activity: Sufficiently Active  . Days of Exercise per Week: 7 days  . Minutes of Exercise per Session: 60 min  Stress: No Stress Concern Present  . Feeling of Stress : Not at all  Social Connections: Moderately Isolated  . Frequency of Communication with  Friends and Family: Never  . Frequency of Social Gatherings with Friends and Family: Never  . Attends Religious Services: Never  . Active Member of Clubs or Organizations: Yes  . Attends Archivist Meetings: More than 4 times per year  . Marital Status: Married  Human resources officer Violence: Not on file     Physical Exam: BP (!) 146/72   Pulse 67   Ht 5\' 6"  (1.676 m)   Wt 150 lb (68 kg)   BMI 24.21 kg/m  Constitutional: generally well-appearing Psychiatric: alert and oriented x3 Eyes: extraocular movements intact Mouth: oral pharynx moist, no lesions Neck: supple no lymphadenopathy Cardiovascular: heart regular rate and rhythm Lungs: clear to auscultation bilaterally Abdomen: soft, nontender, nondistended, no obvious ascites, no peritoneal signs, normal bowel sounds Extremities: no lower extremity edema bilaterally Skin: no lesions on visible extremities   Assessment and plan: 80 y.o. male with normocytic anemia, very slight  He does not have iron deficiency.  He has no overt GI bleeding.  It is not clear that he is truly anemic or perhaps just a bit spurious results.  Indeed his most recent hemoglobin was 12.5.  This is normocytic.  I explained to him that I doubt anything serious like a tumor or cancer is going on.  I recommended that he complete 2 sets of home FOBT cards and if any of these are positive then I would likely recommend an upper endoscopy.  If they are all negative then I think his blood counts can simply be followed every 3 to 6 months.   Please see the "Patient Instructions" section for addition details about the plan.   Owens Loffler, MD [facility] 02/20/2020,  8:48 AM  Cc: Plotnikov, Evie Lacks, MD  Total time on date of encounter was 45  minutes (this included time spent preparing to see the patient reviewing records; obtaining and/or reviewing separately obtained history; performing a medically appropriate exam and/or evaluation;  counseling and educating the patient and family if present; ordering medications, tests or procedures if applicable; and documenting clinical information in the health record).

## 2020-02-20 NOTE — Assessment & Plan Note (Signed)
On [place] [employer]  capsules for memory Better of antidepressants

## 2020-02-20 NOTE — Assessment & Plan Note (Signed)
Continue with Repatha, Zetia

## 2020-02-20 NOTE — Assessment & Plan Note (Signed)
GI consultation

## 2020-02-26 ENCOUNTER — Other Ambulatory Visit (INDEPENDENT_AMBULATORY_CARE_PROVIDER_SITE_OTHER): Payer: Medicare Other

## 2020-02-26 DIAGNOSIS — D649 Anemia, unspecified: Secondary | ICD-10-CM

## 2020-02-26 LAB — HEMOCCULT SLIDES (X 3 CARDS)
Fecal Occult Blood: NEGATIVE
Fecal Occult Blood: NEGATIVE
OCCULT 1: NEGATIVE
OCCULT 1: NEGATIVE
OCCULT 2: NEGATIVE
OCCULT 2: NEGATIVE
OCCULT 3: NEGATIVE
OCCULT 3: NEGATIVE
OCCULT 4: NEGATIVE
OCCULT 4: NEGATIVE
OCCULT 5: NEGATIVE
OCCULT 5: NEGATIVE

## 2020-02-27 ENCOUNTER — Other Ambulatory Visit: Payer: Self-pay

## 2020-02-27 DIAGNOSIS — D649 Anemia, unspecified: Secondary | ICD-10-CM

## 2020-04-18 ENCOUNTER — Telehealth: Payer: Self-pay | Admitting: Internal Medicine

## 2020-04-18 NOTE — Telephone Encounter (Signed)
Notified pt/wife w/MD response../lmb 

## 2020-04-18 NOTE — Telephone Encounter (Signed)
Loratadine 10 mg a day and/or Flonase 2 sprays each nostril once a day.  Thanks

## 2020-04-18 NOTE — Telephone Encounter (Signed)
   Patient/ spouse calling would like to know the best allergy medicine OTC to use for sneezing that will not raise blood pressure

## 2020-05-06 ENCOUNTER — Telehealth: Payer: Self-pay | Admitting: Internal Medicine

## 2020-05-06 NOTE — Telephone Encounter (Signed)
Patients wife states she thinks its time for the patient to get his hemoglobin checked and wanted to see if Dr. Alain Marion wanted him to do so.  Also wondering if they should get the their 4th covid shot, they got their booster in September and they are traveling out of the country in may so they wanted to know if they should.

## 2020-05-07 NOTE — Telephone Encounter (Signed)
Notified pt wife w/MD response.[name]/LMB

## 2020-05-07 NOTE — Telephone Encounter (Signed)
Okay CBC.  I think it was ordered by Dr. Ardis Hughs' nurse. I would get the fourth COVID-19 shot if we hear about a new aggressive COVID-19 strain coming our way.  Otherwise, 3 shots should be sufficient in my opinion. Thanks,

## 2020-05-19 ENCOUNTER — Other Ambulatory Visit (INDEPENDENT_AMBULATORY_CARE_PROVIDER_SITE_OTHER): Payer: Medicare Other

## 2020-05-19 DIAGNOSIS — D649 Anemia, unspecified: Secondary | ICD-10-CM

## 2020-05-19 LAB — CBC WITH DIFFERENTIAL/PLATELET
Basophils Absolute: 0 10*3/uL (ref 0.0–0.1)
Basophils Relative: 0.6 % (ref 0.0–3.0)
Eosinophils Absolute: 0.3 10*3/uL (ref 0.0–0.7)
Eosinophils Relative: 4.4 % (ref 0.0–5.0)
HCT: 37 % — ABNORMAL LOW (ref 39.0–52.0)
Hemoglobin: 12.6 g/dL — ABNORMAL LOW (ref 13.0–17.0)
Lymphocytes Relative: 26.4 % (ref 12.0–46.0)
Lymphs Abs: 1.5 10*3/uL (ref 0.7–4.0)
MCHC: 34 g/dL (ref 30.0–36.0)
MCV: 90.2 fl (ref 78.0–100.0)
Monocytes Absolute: 0.6 10*3/uL (ref 0.1–1.0)
Monocytes Relative: 10.8 % (ref 3.0–12.0)
Neutro Abs: 3.3 10*3/uL (ref 1.4–7.7)
Neutrophils Relative %: 57.8 % (ref 43.0–77.0)
Platelets: 310 10*3/uL (ref 150.0–400.0)
RBC: 4.1 Mil/uL — ABNORMAL LOW (ref 4.22–5.81)
RDW: 13.2 % (ref 11.5–15.5)
WBC: 5.8 10*3/uL (ref 4.0–10.5)

## 2020-05-20 ENCOUNTER — Other Ambulatory Visit: Payer: Self-pay

## 2020-05-20 DIAGNOSIS — D649 Anemia, unspecified: Secondary | ICD-10-CM

## 2020-06-09 DIAGNOSIS — M546 Pain in thoracic spine: Secondary | ICD-10-CM | POA: Diagnosis not present

## 2020-06-09 DIAGNOSIS — M79644 Pain in right finger(s): Secondary | ICD-10-CM | POA: Diagnosis not present

## 2020-06-14 DIAGNOSIS — Z23 Encounter for immunization: Secondary | ICD-10-CM | POA: Diagnosis not present

## 2020-06-29 DIAGNOSIS — E860 Dehydration: Secondary | ICD-10-CM | POA: Diagnosis not present

## 2020-06-29 DIAGNOSIS — T679XXA Effect of heat and light, unspecified, initial encounter: Secondary | ICD-10-CM | POA: Diagnosis not present

## 2020-06-29 DIAGNOSIS — T675XXA Heat exhaustion, unspecified, initial encounter: Secondary | ICD-10-CM | POA: Diagnosis not present

## 2020-06-29 DIAGNOSIS — R55 Syncope and collapse: Secondary | ICD-10-CM | POA: Diagnosis not present

## 2020-06-29 DIAGNOSIS — R0902 Hypoxemia: Secondary | ICD-10-CM | POA: Diagnosis not present

## 2020-06-30 ENCOUNTER — Telehealth: Payer: Self-pay | Admitting: Internal Medicine

## 2020-06-30 NOTE — Telephone Encounter (Signed)
   Spouse calling to request appointment today with Dr Alain Marion. She states patient passed out over the weekend while playing golf. EMS state patient was probably dehydrated. Spouse demanding appointment today Advised Dr Alain Marion was booked, declined to see other providers in office..  Please advise

## 2020-07-01 MED ORDER — AZITHROMYCIN 250 MG PO TABS: ORAL_TABLET | ORAL | 0 refills | Status: DC

## 2020-07-01 MED ORDER — METHYLPREDNISOLONE 4 MG PO TBPK: ORAL_TABLET | ORAL | 0 refills | Status: DC

## 2020-07-01 NOTE — Telephone Encounter (Signed)
I am not in the office today.  Please see if he could be seen by Dr. Theodosia Blender [facility]) Thanks

## 2020-07-01 NOTE — Telephone Encounter (Signed)
Noted. I will send the prescriptions. Please have him see me before their departure overseas if possible.  I would like to listen to his heart. Thanks,

## 2020-07-01 NOTE — Telephone Encounter (Signed)
Thank you :)

## 2020-07-01 NOTE — Telephone Encounter (Signed)
Notified pt/wife w/MD response. Wife states he doesn't need to see his heart doctor.. his heart is fine. She states she use to call and MD would just work her him in. Inform wife depending on providers schedule they are not able to work any one else in. She states well.. he doesn't neef to see an cardiologist and he is fine today. She states they are leaving next week going to [place] and MD would give them a precaution rx for a Zpack and Prednisone. She is requesting MD to send to pharmacy.Marland KitchenJohny Chess

## 2020-07-02 NOTE — Telephone Encounter (Signed)
Notified pt wife rx's has been sent to [facility]. Also made appt for 07/07/20.Marland KitchenJohny Chess

## 2020-07-07 ENCOUNTER — Encounter: Payer: Self-pay | Admitting: Internal Medicine

## 2020-07-07 ENCOUNTER — Other Ambulatory Visit: Payer: Self-pay

## 2020-07-07 ENCOUNTER — Ambulatory Visit (INDEPENDENT_AMBULATORY_CARE_PROVIDER_SITE_OTHER): Payer: Medicare Other | Admitting: Internal Medicine

## 2020-07-07 DIAGNOSIS — R55 Syncope and collapse: Secondary | ICD-10-CM | POA: Diagnosis not present

## 2020-07-07 NOTE — Progress Notes (Signed)
Subjective:  Patient ID: [name] Browning, male    DOB: Feb 12, 1941  Age: 80 y.o. MRN: [id number]  CC: Loss of Consciousness (Last week pt passed out while playing golf..EMS state patient was probably dehydrated. Pt denies any syncope since then.[name])   HPI [name] Browning presents for a syncopal episode on Sun 1 week ago.  The patient got overheated playing golf and practicing shots. His syncope was incomplete - he continued to hear voices.  Ambulance came - BP was 118/70, O2 sat 100%, HR 80. EKG NSR w/1 degree AV block - reviewed  Outpatient Medications Prior to Visit  Medication Sig Dispense Refill  . Cholecalciferol (VITAMIN D3) 2000 units capsule Take 1 capsule (2,000 Units total) by mouth daily. 100 capsule 3  . cyanocobalamin 1000 MCG tablet Take 1,000 mcg by mouth daily.    [name] ezetimibe (ZETIA) 10 MG tablet Take 1 tablet (10 mg total) by mouth daily. 90 tablet 3  . Multiple Vitamin tablet Take 1 tablet by mouth daily.    . Multiple Vitamins-Minerals (ZINC PO) Take 1 tablet by mouth daily.    [name] RAPAFLO 8 MG CAPS capsule Take 8 mg by mouth daily with breakfast.     . REPATHA SURECLICK 409 MG/ML SOAJ INJECT 1 PEN INTO THE SKIN EVERY 14 (FOURTEEN) DAYS. 6 mL 3  . ST JOHNS WORT PO Take 1 tablet by mouth daily.    [name] VALERIAN PO Take 1 tablet by mouth daily.    . vitamin E 400 UNIT capsule Take 400 Units by mouth daily.    [name] azithromycin (ZITHROMAX Z-PAK) 250 MG tablet As directed (Patient not taking: Reported on 07/07/2020) 6 tablet 0  . methylPREDNISolone (MEDROL DOSEPAK) 4 MG TBPK tablet As directed (Patient not taking: Reported on 07/07/2020) 21 tablet 0  . famotidine (PEPCID) 20 MG tablet Take 1 tablet (20 mg total) by mouth 2 (two) times daily. (Patient not taking: No sig reported) 90 tablet 3   No facility-administered medications prior to visit.    ROS: Review of Systems  Constitutional: Negative for appetite change, fatigue and unexpected weight change.  HENT: Negative for  congestion, nosebleeds, sneezing, sore throat and trouble swallowing.   Eyes: Negative for itching and visual disturbance.  Respiratory: Negative for cough, chest tightness, shortness of breath and wheezing.   Cardiovascular: Negative for chest pain and palpitations.  Gastrointestinal: Negative for abdominal distention, blood in stool, diarrhea and nausea.  Genitourinary: Negative for frequency and hematuria.  Musculoskeletal: Negative for back pain, gait problem, joint swelling and neck pain.  Skin: Negative for rash.  Neurological: Positive for syncope. Negative for dizziness, tremors, speech difficulty and weakness.  Psychiatric/Behavioral: Negative for agitation, dysphoric mood and sleep disturbance. The patient is not nervous/anxious.     Objective:  There were no vitals taken for this visit.  BP Readings from Last 3 Encounters:  02/20/20 (!) 146/72  02/18/20 (!) 142/80  01/29/20 (!) 150/70    Wt Readings from Last 3 Encounters:  02/20/20 150 lb (68 kg)  02/18/20 149 lb 6.4 oz (67.8 kg)  01/29/20 150 lb 12.8 oz (68.4 kg)    Physical Exam Constitutional:      General: He is not in acute distress.    Appearance: He is well-developed.     Comments: NAD  Eyes:     Conjunctiva/sclera: Conjunctivae normal.     Pupils: Pupils are equal, round, and reactive to light.  Neck:     Thyroid: No thyromegaly.  Vascular: No JVD.  Cardiovascular:     Rate and Rhythm: Normal rate and regular rhythm.     Heart sounds: Normal heart sounds. No murmur heard. No friction rub. No gallop.   Pulmonary:     Effort: Pulmonary effort is normal. No respiratory distress.     Breath sounds: Normal breath sounds. No wheezing or rales.  Chest:     Chest wall: No tenderness.  Abdominal:     General: Bowel sounds are normal. There is no distension.     Palpations: Abdomen is soft. There is no mass.     Tenderness: There is no abdominal tenderness. There is no guarding or rebound.   Musculoskeletal:        General: No tenderness. Normal range of motion.     Cervical back: Normal range of motion.  Lymphadenopathy:     Cervical: No cervical adenopathy.  Skin:    General: Skin is warm and dry.     Findings: No rash.  Neurological:     Mental Status: He is alert and oriented to person, place, and time.     Cranial Nerves: No cranial nerve deficit.     Motor: No abnormal muscle tone.     Coordination: Coordination normal.     Gait: Gait normal.     Deep Tendon Reflexes: Reflexes are normal and symmetric.  Psychiatric:        Behavior: Behavior normal.        Thought Content: Thought content normal.        Judgment: Judgment normal.     Lab Results  Component Value Date   WBC 5.8 05/19/2020   HGB 12.6 (L) 05/19/2020   HCT 37.0 (L) 05/19/2020   PLT 310.0 05/19/2020   GLUCOSE 97 10/26/2019   CHOL 114 10/26/2019   TRIG 76 10/26/2019   HDL 75 10/26/2019   LDLDIRECT 152.0 10/12/2018   LDLCALC 23 10/26/2019   ALT 16 10/26/2019   AST 18 10/26/2019   NA 144 10/26/2019   K 4.6 10/26/2019   CL 106 10/26/2019   CREATININE 1.07 10/26/2019   BUN 20 10/26/2019   CO2 28 10/26/2019   TSH 3.45 10/26/2019   PSA 0.5 10/26/2019   HGBA1C 5.8 (H) 05/19/2016    CT CARDIAC SCORING  Addendum Date: 12/04/2018   ADDENDUM REPORT: 12/04/2018 16:39 EXAM: OVER-READ INTERPRETATION  CT CHEST The following report is an over-read performed by radiologist Dr. Fonnie Birkenhead [facility] on 12/04/2018. This over-read does not include interpretation of cardiac or coronary anatomy or pathology. The interpretation by the cardiologist is attached. COMPARISON:  None. FINDINGS: Atherosclerotic calcification of the aorta. Heart is at the upper limits of normal in size. No pericardial or pleural effusion. Calcified granuloma in the right lower lobe. Visualized portions of the lungs are otherwise clear. Visualized portions of the upper abdomen and bones are unremarkable.  IMPRESSION: Aortic atherosclerosis (ICD10-170.0). Electronically Signed   By: Lorin Picket M.D.   On: 12/04/2018 16:39   Result Date: 12/04/2018 CLINICAL DATA:  Risk stratification EXAM: Coronary Calcium Score MEDICATIONS: None TECHNIQUE: The patient was scanned on a [employer]. Axial non-contrast 3 mm slices were carried out through the heart. The data set was analyzed on a dedicated work station and scored using the [employer]. FINDINGS: Non-cardiac: See separate report from [facility]. Ascending Aorta: Normal size, minimal calcifications. Pericardium: Normal. Coronary arteries: Normal origin. IMPRESSION: Coronary calcium score of 273. This was 110 percentile for age and sex matched  control. Electronically Signed: By: Ena Dawley On: 12/04/2018 13:17    Assessment & Plan:   There are no diagnoses linked to this encounter.   No orders of the defined types were placed in this encounter.    Follow-up: No follow-ups on file.  Walker Kehr, MD

## 2020-07-07 NOTE — Assessment & Plan Note (Signed)
No relapse.  Normal exam.  We discussed the importance of good hydration, heat exhaustion prevention etc. EKG reviewed with the patient.

## 2020-08-27 DIAGNOSIS — Z961 Presence of intraocular lens: Secondary | ICD-10-CM | POA: Diagnosis not present

## 2020-08-27 DIAGNOSIS — H524 Presbyopia: Secondary | ICD-10-CM | POA: Diagnosis not present

## 2020-08-27 DIAGNOSIS — H5213 Myopia, bilateral: Secondary | ICD-10-CM | POA: Diagnosis not present

## 2020-10-27 ENCOUNTER — Other Ambulatory Visit: Payer: Self-pay

## 2020-10-27 ENCOUNTER — Encounter: Payer: Self-pay | Admitting: Internal Medicine

## 2020-10-27 ENCOUNTER — Ambulatory Visit (INDEPENDENT_AMBULATORY_CARE_PROVIDER_SITE_OTHER): Payer: Medicare Other | Admitting: Internal Medicine

## 2020-10-27 VITALS — BP 140/72 | HR 68 | Temp 98.0°F | Ht 66.0 in | Wt 149.2 lb

## 2020-10-27 DIAGNOSIS — E7800 Pure hypercholesterolemia, unspecified: Secondary | ICD-10-CM

## 2020-10-27 DIAGNOSIS — D638 Anemia in other chronic diseases classified elsewhere: Secondary | ICD-10-CM

## 2020-10-27 DIAGNOSIS — Z8719 Personal history of other diseases of the digestive system: Secondary | ICD-10-CM

## 2020-10-27 DIAGNOSIS — N320 Bladder-neck obstruction: Secondary | ICD-10-CM | POA: Diagnosis not present

## 2020-10-27 DIAGNOSIS — Z0000 Encounter for general adult medical examination without abnormal findings: Secondary | ICD-10-CM | POA: Diagnosis not present

## 2020-10-27 DIAGNOSIS — Z23 Encounter for immunization: Secondary | ICD-10-CM | POA: Diagnosis not present

## 2020-10-27 DIAGNOSIS — I10 Essential (primary) hypertension: Secondary | ICD-10-CM

## 2020-10-27 DIAGNOSIS — Z7184 Encounter for health counseling related to travel: Secondary | ICD-10-CM | POA: Insufficient documentation

## 2020-10-27 DIAGNOSIS — M1712 Unilateral primary osteoarthritis, left knee: Secondary | ICD-10-CM | POA: Diagnosis not present

## 2020-10-27 LAB — CBC WITH DIFFERENTIAL/PLATELET
Basophils Absolute: 0 10*3/uL (ref 0.0–0.1)
Basophils Relative: 0.4 % (ref 0.0–3.0)
Eosinophils Absolute: 0.1 10*3/uL (ref 0.0–0.7)
Eosinophils Relative: 2.1 % (ref 0.0–5.0)
HCT: 38.1 % — ABNORMAL LOW (ref 39.0–52.0)
Hemoglobin: 12.8 g/dL — ABNORMAL LOW (ref 13.0–17.0)
Lymphocytes Relative: 18.2 % (ref 12.0–46.0)
Lymphs Abs: 1.2 10*3/uL (ref 0.7–4.0)
MCHC: 33.7 g/dL (ref 30.0–36.0)
MCV: 91.8 fl (ref 78.0–100.0)
Monocytes Absolute: 0.5 10*3/uL (ref 0.1–1.0)
Monocytes Relative: 7.5 % (ref 3.0–12.0)
Neutro Abs: 4.8 10*3/uL (ref 1.4–7.7)
Neutrophils Relative %: 71.8 % (ref 43.0–77.0)
Platelets: 339 10*3/uL (ref 150.0–400.0)
RBC: 4.14 Mil/uL — ABNORMAL LOW (ref 4.22–5.81)
RDW: 13 % (ref 11.5–15.5)
WBC: 6.7 10*3/uL (ref 4.0–10.5)

## 2020-10-27 LAB — URINALYSIS
Bilirubin Urine: NEGATIVE
Hgb urine dipstick: NEGATIVE
Ketones, ur: NEGATIVE
Leukocytes,Ua: NEGATIVE
Nitrite: NEGATIVE
Specific Gravity, Urine: 1.02 (ref 1.000–1.030)
Total Protein, Urine: NEGATIVE
Urine Glucose: NEGATIVE
Urobilinogen, UA: 0.2 (ref 0.0–1.0)
pH: 6 (ref 5.0–8.0)

## 2020-10-27 LAB — COMPREHENSIVE METABOLIC PANEL
ALT: 12 U/L (ref 0–53)
AST: 14 U/L (ref 0–37)
Albumin: 4.3 g/dL (ref 3.5–5.2)
Alkaline Phosphatase: 89 U/L (ref 39–117)
BUN: 23 mg/dL (ref 6–23)
CO2: 28 mEq/L (ref 19–32)
Calcium: 9.9 mg/dL (ref 8.4–10.5)
Chloride: 104 mEq/L (ref 96–112)
Creatinine, Ser: 1.13 mg/dL (ref 0.40–1.50)
GFR: 61.74 mL/min (ref >60.00)
Glucose, Bld: 111 mg/dL — ABNORMAL HIGH (ref 70–99)
Potassium: 4.2 mEq/L (ref 3.5–5.1)
Sodium: 141 mEq/L (ref 135–145)
Total Bilirubin: 0.3 mg/dL (ref 0.2–1.2)
Total Protein: 7 g/dL (ref 6.0–8.3)

## 2020-10-27 LAB — IRON,TIBC AND FERRITIN PANEL
%SAT: 24 % (calc) (ref 20–48)
Ferritin: 76 ng/mL (ref 24–380)
Iron: 81 ug/dL (ref 50–180)
TIBC: 340 mcg/dL (calc) (ref 250–425)

## 2020-10-27 LAB — LIPID PANEL
Cholesterol: 117 mg/dL (ref 0–200)
HDL: 64.7 mg/dL (ref >39.00)
LDL Cholesterol: 25 mg/dL (ref 0–99)
NonHDL: 52.07
Total CHOL/HDL Ratio: 2
Triglycerides: 137 mg/dL (ref 0.0–149.0)
VLDL: 27.4 mg/dL (ref 0.0–40.0)

## 2020-10-27 LAB — TSH: TSH: 3.24 u[IU]/mL (ref 0.35–5.50)

## 2020-10-27 LAB — PSA: PSA: 0.64 ng/mL (ref 0.10–4.00)

## 2020-10-27 NOTE — Assessment & Plan Note (Signed)
Monitor BP t home

## 2020-10-27 NOTE — Assessment & Plan Note (Signed)
Dr Ardis Hughs - nl colon 02/2019 Check CBC

## 2020-10-27 NOTE — Assessment & Plan Note (Signed)
On Repatha Cont w/Zetia

## 2020-10-27 NOTE — Progress Notes (Signed)
Subjective:  Patient ID: [name] Browning, male    DOB: 21-Aug-1940  Age: 80 y.o. MRN: [id number]  CC: Annual Exam (Want flu shot)   HPI [name] Browning presents for a well exam  C/o L knee pain - he just had a shot - better  Outpatient Medications Prior to Visit  Medication Sig Dispense Refill   Cholecalciferol (VITAMIN D3) 2000 units capsule Take 1 capsule (2,000 Units total) by mouth daily. 100 capsule 3   cyanocobalamin 1000 MCG tablet Take 1,000 mcg by mouth daily.     ezetimibe (ZETIA) 10 MG tablet Take 1 tablet (10 mg total) by mouth daily. 90 tablet 3   Multiple Vitamin tablet Take 1 tablet by mouth daily.     Multiple Vitamins-Minerals (ZINC PO) Take 1 tablet by mouth daily.     RAPAFLO 8 MG CAPS capsule Take 8 mg by mouth daily with breakfast.      REPATHA SURECLICK [id number] MG/ML SOAJ INJECT 1 PEN INTO THE SKIN EVERY 14 (FOURTEEN) DAYS. 6 mL 3   ST JOHNS WORT PO Take 1 tablet by mouth daily.     VALERIAN PO Take 1 tablet by mouth daily.     vitamin E 400 UNIT capsule Take 400 Units by mouth daily.     azithromycin (ZITHROMAX Z-PAK) 250 MG tablet As directed (Patient not taking: No sig reported) 6 tablet 0   methylPREDNISolone (MEDROL DOSEPAK) 4 MG TBPK tablet As directed (Patient not taking: No sig reported) 21 tablet 0   No facility-administered medications prior to visit.    ROS: Review of Systems  Constitutional:  Negative for appetite change, fatigue and unexpected weight change.  HENT:  Negative for congestion, nosebleeds, sneezing, sore throat and trouble swallowing.   Eyes:  Negative for itching and visual disturbance.  Respiratory:  Negative for cough.   Cardiovascular:  Negative for chest pain, palpitations and leg swelling.  Gastrointestinal:  Negative for abdominal distention, blood in stool, diarrhea and nausea.  Genitourinary:  Negative for frequency and hematuria.  Musculoskeletal:  Positive for arthralgias and gait problem. Negative for back pain, joint  swelling and neck pain.  Skin:  Negative for rash.  Neurological:  Negative for dizziness, tremors, speech difficulty and weakness.  Psychiatric/Behavioral:  Negative for agitation, dysphoric mood and sleep disturbance. The patient is not nervous/anxious.    Objective:  BP 140/72 (BP Location: Left Arm)   Pulse 68   Temp 98 F (36.7 C) (Oral)   Ht '5\' 6"'$  (1.676 m)   Wt 149 lb 3.2 oz (67.7 kg)   SpO2 96%   BMI 24.08 kg/m   BP Readings from Last 3 Encounters:  10/27/20 140/72  02/20/20 (!) 146/72  02/18/20 (!) 142/80    Wt Readings from Last 3 Encounters:  10/27/20 149 lb 3.2 oz (67.7 kg)  02/20/20 150 lb (68 kg)  02/18/20 149 lb 6.4 oz (67.8 kg)    Physical Exam Constitutional:      General: He is not in acute distress.    Appearance: He is well-developed.     Comments: NAD  Eyes:     Conjunctiva/sclera: Conjunctivae normal.     Pupils: Pupils are equal, round, and reactive to light.  Neck:     Thyroid: No thyromegaly.     Vascular: No JVD.  Cardiovascular:     Rate and Rhythm: Normal rate and regular rhythm.     Heart sounds: Normal heart sounds. No murmur heard.   No friction rub. No  gallop.  Pulmonary:     Effort: Pulmonary effort is normal. No respiratory distress.     Breath sounds: Normal breath sounds. No wheezing or rales.  Chest:     Chest wall: No tenderness.  Abdominal:     General: Bowel sounds are normal. There is no distension.     Palpations: Abdomen is soft. There is no mass.     Tenderness: There is no abdominal tenderness. There is no guarding or rebound.  Musculoskeletal:        General: Tenderness present. Normal range of motion.     Cervical back: Normal range of motion.  Lymphadenopathy:     Cervical: No cervical adenopathy.  Skin:    General: Skin is warm and dry.     Findings: No rash.  Neurological:     Mental Status: He is alert and oriented to person, place, and time.     Cranial Nerves: No cranial nerve deficit.     Motor: No  abnormal muscle tone.     Coordination: Coordination normal.     Gait: Gait normal.     Deep Tendon Reflexes: Reflexes are normal and symmetric.  Psychiatric:        Behavior: Behavior normal.        Thought Content: Thought content normal.        Judgment: Judgment normal.   L knee - bandaid is on  Lab Results  Component Value Date   WBC 5.8 05/19/2020   HGB 12.6 (L) 05/19/2020   HCT 37.0 (L) 05/19/2020   PLT 310.0 05/19/2020   GLUCOSE 97 10/26/2019   CHOL 114 10/26/2019   TRIG 76 10/26/2019   HDL 75 10/26/2019   LDLDIRECT 152.0 10/12/2018   LDLCALC 23 10/26/2019   ALT 16 10/26/2019   AST 18 10/26/2019   NA 144 10/26/2019   K 4.6 10/26/2019   CL 106 10/26/2019   CREATININE 1.07 10/26/2019   BUN 20 10/26/2019   CO2 28 10/26/2019   TSH 3.45 10/26/2019   PSA 0.5 10/26/2019   HGBA1C 5.8 (H) 05/19/2016    CT CARDIAC SCORING  Addendum Date: 12/04/2018   ADDENDUM REPORT: 12/04/2018 16:39 EXAM: OVER-READ INTERPRETATION  CT CHEST The following report is an over-read performed by radiologist Dr. Fonnie Birkenhead [facility] on 12/04/2018. This over-read does not include interpretation of cardiac or coronary anatomy or pathology. The interpretation by the cardiologist is attached. COMPARISON:  None. FINDINGS: Atherosclerotic calcification of the aorta. Heart is at the upper limits of normal in size. No pericardial or pleural effusion. Calcified granuloma in the right lower lobe. Visualized portions of the lungs are otherwise clear. Visualized portions of the upper abdomen and bones are unremarkable. IMPRESSION: Aortic atherosclerosis (ICD10-170.0). Electronically Signed   By: Lorin Picket M.D.   On: 12/04/2018 16:39   Result Date: 12/04/2018 CLINICAL DATA:  Risk stratification EXAM: Coronary Calcium Score MEDICATIONS: None TECHNIQUE: The patient was scanned on a [employer]. Axial non-contrast 3 mm slices were carried out through the heart. The data set was  analyzed on a dedicated work station and scored using the [place]. FINDINGS: Non-cardiac: See separate report from [facility]. Ascending Aorta: Normal size, minimal calcifications. Pericardium: Normal. Coronary arteries: Normal origin. IMPRESSION: Coronary calcium score of 273. This was 66 percentile for age and sex matched control. Electronically Signed: By: Ena Dawley On: 12/04/2018 13:17    Assessment & Plan:     Walker Kehr, MD

## 2020-10-27 NOTE — Patient Instructions (Signed)
Normal BP<130/85 

## 2020-10-27 NOTE — Addendum Note (Signed)
Addended by: Boris Lown B on: 10/27/2020 11:53 AM   Modules accepted: Orders

## 2020-10-27 NOTE — Addendum Note (Signed)
Addended by: Earnstine Regal on: 10/27/2020 11:51 AM   Modules accepted: Orders

## 2020-10-27 NOTE — Assessment & Plan Note (Signed)
Dr Ardis Hughs - nl colon 02/2019

## 2020-10-28 ENCOUNTER — Other Ambulatory Visit: Payer: Self-pay | Admitting: Internal Medicine

## 2020-10-31 ENCOUNTER — Other Ambulatory Visit: Payer: Self-pay | Admitting: Internal Medicine

## 2020-10-31 DIAGNOSIS — R739 Hyperglycemia, unspecified: Secondary | ICD-10-CM

## 2020-10-31 DIAGNOSIS — D638 Anemia in other chronic diseases classified elsewhere: Secondary | ICD-10-CM

## 2020-11-05 DIAGNOSIS — L218 Other seborrheic dermatitis: Secondary | ICD-10-CM | POA: Diagnosis not present

## 2020-11-05 DIAGNOSIS — L820 Inflamed seborrheic keratosis: Secondary | ICD-10-CM | POA: Diagnosis not present

## 2020-11-05 DIAGNOSIS — L570 Actinic keratosis: Secondary | ICD-10-CM | POA: Diagnosis not present

## 2020-11-05 DIAGNOSIS — L812 Freckles: Secondary | ICD-10-CM | POA: Diagnosis not present

## 2020-11-05 DIAGNOSIS — L821 Other seborrheic keratosis: Secondary | ICD-10-CM | POA: Diagnosis not present

## 2020-11-05 DIAGNOSIS — Z85828 Personal history of other malignant neoplasm of skin: Secondary | ICD-10-CM | POA: Diagnosis not present

## 2020-11-05 DIAGNOSIS — D1801 Hemangioma of skin and subcutaneous tissue: Secondary | ICD-10-CM | POA: Diagnosis not present

## 2020-11-05 DIAGNOSIS — D2272 Melanocytic nevi of left lower limb, including hip: Secondary | ICD-10-CM | POA: Diagnosis not present

## 2020-11-19 DIAGNOSIS — Z125 Encounter for screening for malignant neoplasm of prostate: Secondary | ICD-10-CM | POA: Diagnosis not present

## 2020-11-26 DIAGNOSIS — N401 Enlarged prostate with lower urinary tract symptoms: Secondary | ICD-10-CM | POA: Diagnosis not present

## 2020-11-26 DIAGNOSIS — R351 Nocturia: Secondary | ICD-10-CM | POA: Diagnosis not present

## 2020-12-01 ENCOUNTER — Other Ambulatory Visit: Payer: Self-pay | Admitting: Cardiology

## 2020-12-01 ENCOUNTER — Telehealth: Payer: Self-pay | Admitting: Internal Medicine

## 2020-12-01 DIAGNOSIS — D638 Anemia in other chronic diseases classified elsewhere: Secondary | ICD-10-CM

## 2020-12-01 DIAGNOSIS — R739 Hyperglycemia, unspecified: Secondary | ICD-10-CM

## 2020-12-01 DIAGNOSIS — Z01818 Encounter for other preprocedural examination: Secondary | ICD-10-CM

## 2020-12-01 DIAGNOSIS — R55 Syncope and collapse: Secondary | ICD-10-CM

## 2020-12-01 DIAGNOSIS — M25562 Pain in left knee: Secondary | ICD-10-CM | POA: Diagnosis not present

## 2020-12-01 NOTE — Telephone Encounter (Signed)
He has labs ordered already.  Please add INR.  Thanks

## 2020-12-01 NOTE — Telephone Encounter (Signed)
Patient requesting order for pre-op labs.  Patient states surgery is schedule 12-15-2020 at [facility] on knee  Please call patient

## 2020-12-02 NOTE — Telephone Encounter (Signed)
Notified pt/wife w/MD response../lmb 

## 2020-12-03 ENCOUNTER — Telehealth: Payer: Self-pay | Admitting: Cardiology

## 2020-12-03 NOTE — Telephone Encounter (Signed)
Pt is agreeable ot plan of care for an appt for pre op clearance. Pt is agreeable to appt in the [place] office as the [facility] and [facility] are both full. Appt with Coletta Memos, FNP 12/08/20 @ 8 am at [facility] location. I will fax notes to surgeon pt has appt 12/08/20.

## 2020-12-03 NOTE — Telephone Encounter (Signed)
   [facility] Pre-operative Risk Assessment    Patient Name: [name] Browning  DOB: [birth year] MRN: [id number]  HEARTCARE STAFF:  - IMPORTANT!!!!!! Under Visit Info/Reason for Call, type in Other and utilize the format Clearance MM/DD/YY or Clearance TBD. Do not use dashes or single digits. - Please review there is not already an duplicate clearance open for this procedure. - If request is for dental extraction, please clarify the # of teeth to be extracted. - If the patient is currently at the dentist's office, call Pre-Op Callback Staff (MA/nurse) to input urgent request.  - If the patient is not currently in the dentist office, please route to the Pre-Op pool.  Request for surgical clearance:  What type of surgery is being performed? Left total Knee arthoplasty   When is this surgery scheduled? 12-15-20  What type of clearance is required (medical clearance vs. Pharmacy clearance to hold med vs. Both)? both  Are there any medications that need to be held prior to surgery and how long? Whatever is protocol for cardiology   Practice name and name of physician performing surgery? [facility], Dr. Dorna Leitz  What is the office phone number? [phone]   7.   What is the office fax number? [phone]  8.   Anesthesia type (None, local, MAC, general) ? spinal   Ermelinda Das 12/03/2020, 3:06 PM  _________________________________________________________________   (provider comments below)

## 2020-12-03 NOTE — Telephone Encounter (Signed)
Primary Cardiologist:Traci Turner, MD  Chart reviewed as part of pre-operative protocol coverage. Because of [name] Browning's past medical history and time since last visit, he/she will require a follow-up visit in order to better assess preoperative cardiovascular risk.  Pre-op covering staff: - Please schedule appointment and call patient to inform them. - Please contact requesting surgeon's office via preferred method (i.e, phone, fax) to inform them of need for appointment prior to surgery.  If applicable, this message will also be routed to pharmacy pool and/or primary cardiologist for input on holding anticoagulant/antiplatelet agent as requested below so that this information is available at time of patient's appointment.   Deberah Pelton, NP  12/03/2020, 3:36 PM

## 2020-12-05 ENCOUNTER — Other Ambulatory Visit: Payer: Self-pay | Admitting: Orthopedic Surgery

## 2020-12-05 DIAGNOSIS — Z01811 Encounter for preprocedural respiratory examination: Secondary | ICD-10-CM

## 2020-12-05 NOTE — Care Plan (Signed)
Ortho Bundle Case Management Note  Patient Details  Name: [name] Browning MRN: [id number] Date of Birth: 05/19/1940     Spoke with patient prior to surgery. He will discharge to home with family to assist. Rolling walker and CPM ordered for home use. HHPT referral to [place] [facility] [place] set up with [place]. Patient and MD in agreement with plan. Choice offered                 DME Arranged:  CPM, Walker rolling DME Agency:  [facility]  HH Arranged:  PT HH Agency:  [facility]  Additional Comments: Please contact me with any questions of if this plan should need to change.  Ladell Heads,  [place] Specialist  [phone] 12/05/2020, 3:54 PM

## 2020-12-05 NOTE — Progress Notes (Signed)
Cardiology Office Note:    Date:  12/05/2020   ID:  [name], DOB Jan 06, 1941, MRN [id number]  PCP:  Cassandria Anger, MD   [facility] Providers Cardiologist:  Fransico Him, MD      Referring MD: Cassandria Anger, MD   Preoperative cardiac evaluation  History of Present Illness:    [name] Browning is a 80 y.o. male with a hx of essential hypertension, atrial flutter, aortic atherosclerosis, hyperlipidemia, dizziness, dyspnea, cough, diarrhea, and vasovagal syncope.  He is status post RFCA 2013.  He underwent cardiac catheterization in the 1990s that showed normal coronary anatomy.  He underwent coronary calcium scoring which showed a score of 273 as well as aortic atherosclerosis 10/20.  A nuclear stress test showed no ischemia.  An echocardiogram showed normal LV function.  He has statin intolerant but has done well with Repatha and ezetimibe.  He was last seen by Dr. Radford Pax on 01/29/2020.  During that time he was doing well.  He denied chest pain, pressure, shortness of breath, dyspnea, PND, orthopnea, dizziness, palpitations and syncope.  He reported compliance with his medications and was tolerating well without side effects.  He was playing golf and walking for exercise.  He presents the clinic today for follow-up evaluation states he feels well.  He continues to golf regularly.  He was recently out practicing on a driving range had some trouble with his left knee.  He reports that his knee pain is intermittent.  He did have a cortisone injection about 5-6 weeks ago and he feels like he is not getting as much relief from his injections at this time.  We reviewed his blood pressure and he and his wife report that his blood pressure is consistently elevated when he presents to the doctor's office.  At home his blood pressure is in the 573U-20U systolic.  We reviewed secondary causes of hypertension.  I have asked him to limit his ibuprofen intake, sodium, and maintain  a blood pressure log.  I will give him the salty 6 diet sheet, have him maintain his physical activity, and send his preoperative cardiac evaluation to orthopedics.  We will plan to keep their appointment with Dr. Radford Pax in January.  Today he denies chest pain, shortness of breath, lower extremity edema, fatigue, palpitations, melena, hematuria, hemoptysis, diaphoresis, weakness, presyncope, syncope, orthopnea, and PND.   Past Medical History:  Diagnosis Date   Acute bronchitis 08/11/2016   6/18  H/o asthma as a child...   ADD (attention deficit disorder)    Anemia in chronic illness 06/15/2013   Followed as Primary Care Patient/ [facility]/ [facility] - Fe studies ok 06/15/13 > stool cards received 06/27/2013 > neg x 5> down to 11.4 08/23/13  > referred for hematology 09/04/2013> Alvy Bimler felt it was due to low Testosterone  Resolved as of 08/14/2015         Anxiety    Asthma    AS A CHILD   Ataxia 06/07/2016   2018 poss age related/meds   Atrial flutter ([place])    a. s/p RFCA 07/2011.  Xarelto d/c 08/2011.   Bladder neck obstruction 10/04/2016   Dr Karsten Ro Rapaflo   Cataracts, bilateral    Colon polyps 10/04/2016   Dr Ardis Hughs Due colon 02/2019   Conjunctivitis 09/14/2013   Cough 02/19/2014   DgEs   01/23/16 Unremarkable esophagram. Allergy profile 04/05/2016 >  Eos 0.4 /  IgE  284  RAST pos grass/trees/dust > rec allergy eval  Depression    Diarrhea 02/11/2017   Disturbance in sleep behavior 04/30/2010    Trazodone  Lorazepam prn   DIZZINESS 01/24/2008   Qualifier: Diagnosis of  By: Royal Piedra NP, Tammy     DJD (degenerative joint disease) of knee    Drug-induced memory loss ([place]) 02/22/2014   D/c statin 01/2014 > improved  D/c Trintellix Dr Tish Frederickson   Dyspnea 06/18/2011   Dysrhythmia    hx a-flutter with successful ablation 2013   Essential hypertension 06/18/2011   NAS diet ASA - unable to use due to h/o AVM bleeding - colon 2003   FATIGUE, ACUTE 03/23/2007   Qualifier: Diagnosis of  By: Melvyn Novas MD, Christena Deem    GERD (gastroesophageal reflux disease)    Heart murmur    a. 06/2011 Echo: 65-70%, mild MR   History of lower GI bleeding 09/26/2012   Followed in GI clinic/ [facility]/ [place]  - colonoscopy 05/22/2001  And 06/14/11 > c/w hemorrhoids and cecal avm    Hyperlipidemia    Hyperlipidemia LDL goal <130 03/22/2007   Followed as Primary Care Patient/ [facility]/ [facility]   Zetia    S/P cardiac cath    a. in 1990's - Dr. Gwenlyn Found - WNL   Scalp laceration 11/20/2014   11/01/14 > staples removed 11/18/2014     Sleep apnea    could not tolerate cpap, uses mouth guard now   TESTICULAR HYPOFUNCTION 09/15/2009   F/u urology on testosterone gel      Past Surgical History:  Procedure Laterality Date   ATRIAL FLUTTER ABLATION N/A 08/06/2011   Procedure: ATRIAL FLUTTER ABLATION;  Surgeon: Deboraha Sprang, MD;  Location: [facility] CATH LAB;  Service: Cardiovascular;  Laterality: N/A;   COLONOSCOPY  02/27/2016   LESION REMOVAL N/A 08/02/2014   Procedure: EXCISION UPPER LIP VASCULAR ANOMALY ;  Surgeon: Irene Limbo, MD;  Location: [facility];  Service: Plastics;  Laterality: N/A;   MASTOIDECTOMY     POLYPECTOMY     TONSILLECTOMY     WRIST FRACTURE SURGERY  2007   left    Current Medications: No outpatient medications have been marked as taking for the 12/08/20 encounter (Appointment) with Deberah Pelton, NP.     Allergies:   Crestor [rosuvastatin], Simvastatin, and Trintellix [vortioxetine]   Social History   Socioeconomic History   Marital status: Married    Spouse name: Not on file   Number of children: Not on file   Years of education: Not on file   Highest education level: Not on file  Occupational History   Occupation: RetiredWater quality scientist for [employer]  Tobacco Use   Smoking status: Former    Packs/day: 3.00    Years: 7.00    Pack years: 21.00    Types: Cigarettes    Start date: 1963    Quit date: 02/16/1968    Years since quitting: 52.8   Smokeless tobacco:  Never  Vaping Use   Vaping Use: Never used  Substance and Sexual Activity   Alcohol use: No   Drug use: No   Sexual activity: Yes  Other Topics Concern   Not on file  Social History Narrative   Not on file   Social Determinants of Health   Financial Resource Strain: Not on file  Food Insecurity: Not on file  Transportation Needs: Not on file  Physical Activity: Not on file  Stress: Not on file  Social Connections: Not on file     Family History:  The patient's family history includes Heart disease in his mother; Hypertension in his brother; Leukemia in his mother; Prostate cancer in his brother and father; Stroke in his father. There is no history of Colon cancer, Stomach cancer, Heart attack, Esophageal cancer, Rectal cancer, or Colon polyps.  ROS:   Please see the history of present illness.     All other systems reviewed and are negative.   Risk Assessment/Calculations:           Physical Exam:    VS:  There were no vitals taken for this visit.    Wt Readings from Last 3 Encounters:  10/27/20 149 lb 3.2 oz (67.7 kg)  02/20/20 150 lb (68 kg)  02/18/20 149 lb 6.4 oz (67.8 kg)     GEN:  Well nourished, well developed in no acute distress HEENT: Normal NECK: No JVD; No carotid bruits LYMPHATICS: No lymphadenopathy CARDIAC: RRR, no murmurs, rubs, gallops RESPIRATORY:  Clear to auscultation without rales, wheezing or rhonchi  ABDOMEN: Soft, non-tender, non-distended MUSCULOSKELETAL:  No edema; No deformity  SKIN: Warm and dry NEUROLOGIC:  Alert and oriented x 3 PSYCHIATRIC:  Normal affect    EKGs/Labs/Other Studies Reviewed:    The following studies were reviewed today:  Echocardiogram 02/13/2019 IMPRESSIONS     1. Left ventricular ejection fraction, by visual estimation, is 60 to  65%. The left ventricle has normal function. There is mildly increased  left ventricular hypertrophy.   2. The left ventricle has no regional wall motion abnormalities.    3. Global right ventricle has normal systolic function.The right  ventricular size is normal. No increase in right ventricular wall  thickness.   4. Left atrial size was normal.   5. Right atrial size was normal.   6. Presence of pericardial fat pad.   7. Trivial pericardial effusion is present.   8. The mitral valve is degenerative. Mild mitral valve regurgitation.   9. The tricuspid valve is grossly normal.  10. The aortic valve is tricuspid. Aortic valve regurgitation is not  visualized. No evidence of aortic valve sclerosis or stenosis.  11. Pulmonic regurgitation is mild.  12. The pulmonic valve was grossly normal. Pulmonic valve regurgitation is  mild.  13. Normal pulmonary artery systolic pressure.  14. The tricuspid regurgitant velocity is 1.91 m/s, and with an assumed  right atrial pressure of 3 mmHg, the estimated right ventricular systolic  pressure is normal at 17.6 mmHg.  15. The inferior vena cava is normal in size with greater than 50%  respiratory variability, suggesting right atrial pressure of 3 mmHg.  16. A prior study was performed on 07/08/2015.  17. No significant change from prior study.   Nuclear stress test 02/13/2019  The left ventricular ejection fraction is normal (55-65%). Nuclear stress EF: 63%. Blood pressure demonstrated a normal response to exercise. There was no ST segment deviation noted during stress. The study is normal. This is a low risk study.   Normal pharmacologic nuclear study with no evidence for prior infarct or ischemia.  EKG:  EKG is  ordered today.  The ekg ordered today demonstrates sinus bradycardia with first-degree AV block nonspecific T wave abnormality 59 bpm  Recent Labs: 10/27/2020: ALT 12; BUN 23; Creatinine, Ser 1.13; Hemoglobin 12.8; Platelets 339.0; Potassium 4.2; Sodium 141; TSH 3.24  Recent Lipid Panel    Component Value Date/Time   CHOL 117 10/27/2020 1153   CHOL 110 04/04/2019 0816   TRIG 137.0 10/27/2020 1153    HDL 64.70 10/27/2020  1153   HDL 63 04/04/2019 0816   CHOLHDL 2 10/27/2020 1153   VLDL 27.4 10/27/2020 1153   LDLCALC 25 10/27/2020 1153   LDLCALC 23 10/26/2019 1221   LDLDIRECT 152.0 10/12/2018 1554    ASSESSMENT & PLAN    Coronary artery disease-denies recent episodes of arm neck back or chest discomfort.  Continues to be physically active golfing and walking regularly.  Coronary calcium score previously noted to be elevated at 273.  He underwent nuclear stress test 02/13/2019 which showed an EF of 63%, no ischemia, and low risk. Continue aspirin, Repatha, ezetimibe Heart healthy low-sodium diet-salty 6 given Increase physical activity as tolerated  Hyperlipidemia-10/27/2020: Cholesterol 117; HDL 64.70; LDL Cholesterol 25; Triglycerides 137.0; VLDL 27.4 Continue Repatha, ezetimibe Heart healthy low-sodium high-fiber diet  Increase physical activity as tolerated  Essential hypertension-BP today 160/80.  Well-controlled at home. Maintain physical activity Maintain heart healthy low-sodium diet Maintain blood pressure log  Preoperative cardiac evaluation-left total knee arthroplasty [facility], Dr. Dorna Leitz, scheduled for 12/15/2020    Primary Cardiologist: Fransico Him, MD  Chart reviewed as part of pre-operative protocol coverage. Given past medical history and time since last visit, based on ACC/AHA guidelines, YANIS LARIN would be at acceptable risk for the planned procedure without further cardiovascular testing.   His RCRI is a class II risk, 0.9% risk of major cardiac event.  He is able to complete greater than 4 METS of physical activity.  Patient was advised that if he develops new symptoms prior to surgery to contact our office to arrange a follow-up appointment.  He verbalized understanding.  I will route this recommendation to the requesting party via Epic fax function and remove from pre-op pool.  Please call with questions.    Disposition:  Follow-up with Dr. Radford Pax in 9-12 months.       Medication Adjustments/Labs and Tests Ordered: Current medicines are reviewed at length with the patient today.  Concerns regarding medicines are outlined above.  No orders of the defined types were placed in this encounter.  No orders of the defined types were placed in this encounter.   There are no Patient Instructions on file for this visit.   Signed, Deberah Pelton, NP  12/05/2020 5:55 AM      Notice: This dictation was prepared with Dragon dictation along with smaller phrase technology. Any transcriptional errors that result from this process are unintentional and may not be corrected upon review.  I spent 13 minutes examining this patient, reviewing medications, and using patient centered shared decision making involving her cardiac care.  Prior to her visit I spent greater than 20 minutes reviewing her past medical history,  medications, and prior cardiac tests.

## 2020-12-08 ENCOUNTER — Encounter (HOSPITAL_BASED_OUTPATIENT_CLINIC_OR_DEPARTMENT_OTHER): Payer: Self-pay | Admitting: General Practice

## 2020-12-08 ENCOUNTER — Other Ambulatory Visit: Payer: Self-pay

## 2020-12-08 ENCOUNTER — Ambulatory Visit (INDEPENDENT_AMBULATORY_CARE_PROVIDER_SITE_OTHER): Payer: Medicare Other | Admitting: General Practice

## 2020-12-08 VITALS — BP 160/80 | HR 59 | Ht 66.0 in | Wt 149.0 lb

## 2020-12-08 DIAGNOSIS — Z01810 Encounter for preprocedural cardiovascular examination: Secondary | ICD-10-CM | POA: Diagnosis not present

## 2020-12-08 DIAGNOSIS — I10 Essential (primary) hypertension: Secondary | ICD-10-CM | POA: Diagnosis not present

## 2020-12-08 DIAGNOSIS — I2510 Atherosclerotic heart disease of native coronary artery without angina pectoris: Secondary | ICD-10-CM | POA: Diagnosis not present

## 2020-12-08 DIAGNOSIS — E7800 Pure hypercholesterolemia, unspecified: Secondary | ICD-10-CM

## 2020-12-08 NOTE — Progress Notes (Signed)
Sent message, via epic in basket, requesting orders in epic from surgeon.  

## 2020-12-08 NOTE — Patient Instructions (Addendum)
Medication Instructions:  Continue Current medications *If you need a refill on your cardiac medications before your next appointment, please call your pharmacy*   Lab Work: None Ordered   Testing/Procedures: None ordered   Follow-Up: At [facility], you and your health needs are our priority.  As part of our continuing mission to provide you with exceptional heart care, we have created designated Provider Care Teams.  These Care Teams include your primary Cardiologist (physician) and Advanced Practice Providers (APPs -  Physician Assistants and Nurse Practitioners) who all work together to provide you with the care you need, when you need it.  We recommend signing up for the patient portal called "MyChart".  Sign up information is provided on this After Visit Summary.  MyChart is used to connect with patients for Virtual Visits (Telemedicine).  Patients are able to view lab/test results, encounter notes, upcoming appointments, etc.  Non-urgent messages can be sent to your provider as well.   To learn more about what you can do with MyChart, go to [email or web address].    Your next appointment:   Keep Appointment with Dr. Radford Pax on 1/27 @ 9:40am   The format for your next appointment:   In Person  Provider:   You may see Fransico Him, MD or one of the following Advanced Practice Providers on your designated Care Team:   Melina Copa, PA-C Ermalinda Barrios, PA-C   Other Instructions Use BP log, salty six, maintain physical activity  If Blood Pressures remain in the 140s, call the MD office

## 2020-12-09 NOTE — Patient Instructions (Addendum)
DUE TO COVID-19 ONLY ONE VISITOR IS ALLOWED TO COME WITH YOU AND STAY IN THE WAITING ROOM ONLY DURING PRE OP AND PROCEDURE.   **NO VISITORS ARE ALLOWED IN THE SHORT STAY AREA OR RECOVERY ROOM!!**  IF YOU WILL BE ADMITTED INTO THE HOSPITAL YOU ARE ALLOWED ONLY TWO SUPPORT PEOPLE DURING VISITATION HOURS ONLY (7 AM -8PM)   The support person(s) must pass our screening, gel in and out, and wear a mask at all times, including in the patient's room. Patients must also wear a mask when staff or their support person are in the room. Visitors GUEST BADGE MUST BE WORN VISIBLY  One adult visitor may remain with you overnight and MUST be in the room by 8 P.M.  No visitors under the age of 40. Any visitor under the age of 51 must be accompanied by an adult.    COVID SWAB TESTING MUST BE COMPLETED ON: 12/17/20  **MUST PRESENT COMPLETED FORM AT TESTING SITE**    [place] [place] (backside of the building) You are not required to quarantine, however you are required to wear a well-fitted mask when you are out and around people not in your household.  Hand Hygiene often Do NOT share personal items Notify your provider if you are in close contact with someone who has COVID or you develop fever 100.4 or greater, new onset of sneezing, cough, sore throat, shortness of breath or body aches.  [address], [address], must go inside of the hospital, NOT A DRIVE THRU!  (Must self quarantine after testing. Follow instructions on handout.)       Your procedure is scheduled on: 12/19/20   Report to [facility] Main Entrance    Report to short stay at : 5:15 AM   Call this number if you have problems the morning of surgery [phone]   Do not eat food :After Midnight.   May have liquids until : 4:30 AM   day of surgery  CLEAR LIQUID DIET  Foods Allowed                                                                      Foods Excluded  Water, Black Coffee and tea, regular and decaf                             liquids that you cannot  Plain Jell-O in any flavor  (No red)                                           see through such as: Fruit ices (not with fruit pulp)                                     milk, soups, orange juice              Iced Popsicles (No red)  All solid food                                   Apple juices Sports drinks like Gatorade (No red) Lightly seasoned clear broth or consume(fat free) Sugar  Sample Menu Breakfast                                Lunch                                     Supper Cranberry juice                    Beef broth                            Chicken broth Jell-O                                     Grape juice                           Apple juice Coffee or tea                        Jell-O                                      Popsicle                                                Coffee or tea                        Coffee or tea      Complete one Ensure drink the morning of surgery at: 4:30 AM   the day of surgery.   The day of surgery:  Drink ONE (1) Pre-Surgery Clear Ensure or G2 by am the morning of surgery. Drink in one sitting. Do not sip.  This drink was given to you during your hospital  pre-op appointment visit. Nothing else to drink after completing the  Pre-Surgery Clear Ensure or G2.          If you have questions, please contact your surgeon's office.     Oral Hygiene is also important to reduce your risk of infection.                                    Remember - BRUSH YOUR TEETH THE MORNING OF SURGERY WITH YOUR REGULAR TOOTHPASTE   Do NOT smoke after Midnight   Take these medicines the morning of surgery with A SIP OF WATER: rapaflo.  DO NOT TAKE ANY ORAL DIABETIC MEDICATIONS DAY OF YOUR SURGERY  You may not have any metal on your body including hair pins, jewelry, and  body piercing             Do not wear  lotions, powders, perfumes/cologne, or deodorant               Men may shave face and neck.   Do not bring valuables to the hospital. [place].   Contacts, dentures or bridgework may not be worn into surgery.   Bring small overnight bag day of surgery.    Patients discharged on the day of surgery will not be allowed to drive home.   Special Instructions: Bring a copy of your healthcare power of attorney and living will documents         the day of surgery if you haven't scanned them before.              Please read over the following fact sheets you were given: IF YOU HAVE QUESTIONS ABOUT YOUR PRE-OP INSTRUCTIONS PLEASE CALL [phone]     [facility] - Preparing for Surgery Before surgery, you can play an important role.  Because skin is not sterile, your skin needs to be as free of germs as possible.  You can reduce the number of germs on your skin by washing with CHG (chlorahexidine gluconate) soap before surgery.  CHG is an antiseptic cleaner which kills germs and bonds with the skin to continue killing germs even after washing. Please DO NOT use if you have an allergy to CHG or antibacterial soaps.  If your skin becomes reddened/irritated stop using the CHG and inform your nurse when you arrive at Short Stay. Do not shave (including legs and underarms) for at least 48 hours prior to the first CHG shower.  You may shave your face/neck. Please follow these instructions carefully:  1.  Shower with CHG Soap the night before surgery and the  morning of Surgery.  2.  If you choose to wash your hair, wash your hair first as usual with your  normal  shampoo.  3.  After you shampoo, rinse your hair and body thoroughly to remove the  shampoo.                           4.  Use CHG as you would any other liquid soap.  You can apply chg directly  to the skin and wash                       Gently with a  scrungie or clean washcloth.  5.  Apply the CHG Soap to your body ONLY FROM THE NECK DOWN.   Do not use on face/ open                           Wound or open sores. Avoid contact with eyes, ears mouth and genitals (private parts).                       Wash face,  Genitals (private parts) with your normal soap.             6.  Wash thoroughly, paying special attention to the area where your surgery  will be performed.  7.  Thoroughly rinse your body  with warm water from the neck down.  8.  DO NOT shower/wash with your normal soap after using and rinsing off  the CHG Soap.                9.  Pat yourself dry with a clean towel.            10.  Wear clean pajamas.            11.  Place clean sheets on your bed the night of your first shower and do not  sleep with pets. Day of Surgery : Do not apply any lotions/deodorants the morning of surgery.  Please wear clean clothes to the hospital/surgery center.  FAILURE TO FOLLOW THESE INSTRUCTIONS MAY RESULT IN THE CANCELLATION OF YOUR SURGERY PATIENT SIGNATURE_________________________________  NURSE SIGNATURE__________________________________  ________________________________________________________________________   Adam Phenix  An incentive spirometer is a tool that can help keep your lungs clear and active. This tool measures how well you are filling your lungs with each breath. Taking long deep breaths may help reverse or decrease the chance of developing breathing (pulmonary) problems (especially infection) following: A long period of time when you are unable to move or be active. BEFORE THE PROCEDURE  If the spirometer includes an indicator to show your best effort, your nurse or respiratory therapist will set it to a desired goal. If possible, sit up straight or lean slightly forward. Try not to slouch. Hold the incentive spirometer in an upright position. INSTRUCTIONS FOR USE  Sit on the edge of your bed if possible, or sit up as  far as you can in bed or on a chair. Hold the incentive spirometer in an upright position. Breathe out normally. Place the mouthpiece in your mouth and seal your lips tightly around it. Breathe in slowly and as deeply as possible, raising the piston or the ball toward the top of the column. Hold your breath for 3-5 seconds or for as long as possible. Allow the piston or ball to fall to the bottom of the column. Remove the mouthpiece from your mouth and breathe out normally. Rest for a few seconds and repeat Steps 1 through 7 at least 10 times every 1-2 hours when you are awake. Take your time and take a few normal breaths between deep breaths. The spirometer may include an indicator to show your best effort. Use the indicator as a goal to work toward during each repetition. After each set of 10 deep breaths, practice coughing to be sure your lungs are clear. If you have an incision (the cut made at the time of surgery), support your incision when coughing by placing a pillow or rolled up towels firmly against it. Once you are able to get out of bed, walk around indoors and cough well. You may stop using the incentive spirometer when instructed by your caregiver.  RISKS AND COMPLICATIONS Take your time so you do not get dizzy or light-headed. If you are in pain, you may need to take or ask for pain medication before doing incentive spirometry. It is harder to take a deep breath if you are having pain. AFTER USE Rest and breathe slowly and easily. It can be helpful to keep track of a log of your progress. Your caregiver can provide you with a simple table to help with this. If you are using the spirometer at home, follow these instructions: [place] [employer] IF:  You are having difficultly using the spirometer. You have trouble using the  spirometer as often as instructed. Your pain medication is not giving enough relief while using the spirometer. You develop fever of 100.5 F (38.1 C) or  higher. SEEK IMMEDIATE MEDICAL CARE IF:  You cough up bloody sputum that had not been present before. You develop fever of 102 F (38.9 C) or greater. You develop worsening pain at or near the incision site. MAKE SURE YOU:  Understand these instructions. Will watch your condition. Will get help right away if you are not doing well or get worse. Document Released: 06/14/2006 Document Revised: 04/26/2011 Document Reviewed: 08/15/2006 [place] Patient Information 2014 [place], [place].   ________________________________________________________________________

## 2020-12-10 ENCOUNTER — Encounter (HOSPITAL_COMMUNITY)
Admission: RE | Admit: 2020-12-10 | Discharge: 2020-12-10 | Disposition: A | Discharge status: Home / Self Care | Payer: Medicare Other | Source: Ambulatory Visit | Attending: Orthopedic Surgery | Admitting: Orthopedic Surgery

## 2020-12-10 ENCOUNTER — Other Ambulatory Visit: Payer: Self-pay

## 2020-12-10 ENCOUNTER — Ambulatory Visit (HOSPITAL_COMMUNITY)
Admission: RE | Admit: 2020-12-10 | Discharge: 2020-12-10 | Disposition: A | Discharge status: Home / Self Care | Payer: Medicare Other | Source: Ambulatory Visit | Attending: Orthopedic Surgery | Admitting: Orthopedic Surgery

## 2020-12-10 ENCOUNTER — Encounter (HOSPITAL_COMMUNITY): Payer: Self-pay

## 2020-12-10 VITALS — BP 150/67 | HR 68 | Temp 98.1°F | Ht 66.0 in | Wt 146.0 lb

## 2020-12-10 DIAGNOSIS — I4892 Unspecified atrial flutter: Secondary | ICD-10-CM | POA: Insufficient documentation

## 2020-12-10 DIAGNOSIS — I10 Essential (primary) hypertension: Secondary | ICD-10-CM | POA: Diagnosis not present

## 2020-12-10 DIAGNOSIS — K219 Gastro-esophageal reflux disease without esophagitis: Secondary | ICD-10-CM | POA: Insufficient documentation

## 2020-12-10 DIAGNOSIS — Z01818 Encounter for other preprocedural examination: Secondary | ICD-10-CM | POA: Diagnosis not present

## 2020-12-10 DIAGNOSIS — Z01811 Encounter for preprocedural respiratory examination: Secondary | ICD-10-CM

## 2020-12-10 DIAGNOSIS — Z87891 Personal history of nicotine dependence: Secondary | ICD-10-CM | POA: Insufficient documentation

## 2020-12-10 DIAGNOSIS — M1712 Unilateral primary osteoarthritis, left knee: Secondary | ICD-10-CM | POA: Insufficient documentation

## 2020-12-10 DIAGNOSIS — G4730 Sleep apnea, unspecified: Secondary | ICD-10-CM | POA: Diagnosis not present

## 2020-12-10 LAB — URINALYSIS, ROUTINE W REFLEX MICROSCOPIC
Bilirubin Urine: NEGATIVE
Glucose, UA: NEGATIVE mg/dL
Hgb urine dipstick: NEGATIVE
Ketones, ur: NEGATIVE mg/dL
Leukocytes,Ua: NEGATIVE
Nitrite: NEGATIVE
Protein, ur: NEGATIVE mg/dL
Specific Gravity, Urine: 1.016 (ref 1.005–1.030)
pH: 5 (ref 5.0–8.0)

## 2020-12-10 LAB — COMPREHENSIVE METABOLIC PANEL
ALT: 16 U/L (ref 0–44)
AST: 22 U/L (ref 15–41)
Albumin: 4.4 g/dL (ref 3.5–5.0)
Alkaline Phosphatase: 91 U/L (ref 38–126)
Anion gap: 9 (ref 5–15)
BUN: 24 mg/dL — ABNORMAL HIGH (ref 8–23)
CO2: 26 mmol/L (ref 22–32)
Calcium: 9.4 mg/dL (ref 8.9–10.3)
Chloride: 105 mmol/L (ref 98–111)
Creatinine, Ser: 1.12 mg/dL (ref 0.61–1.24)
GFR, Estimated: >60 mL/min (ref >60)
Glucose, Bld: 105 mg/dL — ABNORMAL HIGH (ref 70–99)
Potassium: 4.3 mmol/L (ref 3.5–5.1)
Sodium: 140 mmol/L (ref 135–145)
Total Bilirubin: 0.5 mg/dL (ref 0.3–1.2)
Total Protein: 8.1 g/dL (ref 6.5–8.1)

## 2020-12-10 LAB — CBC WITH DIFFERENTIAL/PLATELET
Abs Immature Granulocytes: 0.02 10*3/uL (ref 0.00–0.07)
Basophils Absolute: 0 10*3/uL (ref 0.0–0.1)
Basophils Relative: 0 %
Eosinophils Absolute: 0.2 10*3/uL (ref 0.0–0.5)
Eosinophils Relative: 4 %
HCT: 39 % (ref 39.0–52.0)
Hemoglobin: 13 g/dL (ref 13.0–17.0)
Immature Granulocytes: 0 %
Lymphocytes Relative: 24 %
Lymphs Abs: 1.3 10*3/uL (ref 0.7–4.0)
MCH: 31.2 pg (ref 26.0–34.0)
MCHC: 33.3 g/dL (ref 30.0–36.0)
MCV: 93.5 fL (ref 80.0–100.0)
Monocytes Absolute: 0.6 10*3/uL (ref 0.1–1.0)
Monocytes Relative: 11 %
Neutro Abs: 3.3 10*3/uL (ref 1.7–7.7)
Neutrophils Relative %: 61 %
Platelets: 324 10*3/uL (ref 150–400)
RBC: 4.17 MIL/uL — ABNORMAL LOW (ref 4.22–5.81)
RDW: 12.6 % (ref 11.5–15.5)
WBC: 5.5 10*3/uL (ref 4.0–10.5)
nRBC: 0 % (ref 0.0–0.2)

## 2020-12-10 LAB — SURGICAL PCR SCREEN
MRSA, PCR: NEGATIVE
Staphylococcus aureus: NEGATIVE

## 2020-12-10 LAB — APTT: aPTT: 30 seconds (ref 24–36)

## 2020-12-10 LAB — PROTIME-INR
INR: 0.9 (ref 0.8–1.2)
Prothrombin Time: 12.6 seconds (ref 11.4–15.2)

## 2020-12-10 NOTE — Progress Notes (Signed)
COVID Vaccine Completed: Yes Date COVID Vaccine completed: 2022 x 5 COVID vaccine manufacturer: Pfizer      PCP - Dr Lew Dawes Cardiologist - Dr. Fransico Him: LOV: 01/29/20. Clearance: Coletta Memos: PA: 12/08/20: EPIC  Chest x-ray -  EKG - 12/08/20 Stress Test -  ECHO - 02/13/19 Cardiac Cath -  Pacemaker/ICD device last checked:  Sleep Study - Yes CPAP - NO. Uses mouth guard   Fasting Blood Sugar -  Checks Blood Sugar _____ times a day  Blood Thinner Instructions: Aspirin Instructions: Last Dose:  Anesthesia review: Hx: Aflutter,Murmur,OSA(NO CPAP)  Patient denies shortness of breath, fever, cough and chest pain at PAT appointment   Patient verbalized understanding of instructions that were given to them at the PAT appointment. Patient was also instructed that they will need to review over the PAT instructions again at home before surgery.

## 2020-12-12 NOTE — Anesthesia Preprocedure Evaluation (Addendum)
Anesthesia Evaluation  Patient identified by MRN, date of birth, ID band Patient awake    Reviewed: Allergy & Precautions, NPO status , Patient's Chart, lab work & pertinent test results  Airway Mallampati: II  TM Distance: >3 FB Neck ROM: Full    Dental no notable dental hx.    Pulmonary neg pulmonary ROS, former smoker,    Pulmonary exam normal breath sounds clear to auscultation       Cardiovascular hypertension, Normal cardiovascular exam Rhythm:Regular Rate:Normal     Neuro/Psych negative neurological ROS  negative psych ROS   GI/Hepatic Neg liver ROS, GERD  ,  Endo/Other  negative endocrine ROS  Renal/GU negative Renal ROS  negative genitourinary   Musculoskeletal negative musculoskeletal ROS (+)   Abdominal   Peds negative pediatric ROS (+)  Hematology negative hematology ROS (+)   Anesthesia Other Findings   Reproductive/Obstetrics negative OB ROS                            Anesthesia Physical Anesthesia Plan  ASA: 2  Anesthesia Plan: Spinal   Post-op Pain Management:  Regional for Post-op pain   Induction: Intravenous  PONV Risk Score and Plan: 1 and Treatment may vary due to age or medical condition  Airway Management Planned: Simple Face Mask  Additional Equipment:   Intra-op Plan:   Post-operative Plan:   Informed Consent: I have reviewed the patients History and Physical, chart, labs and discussed the procedure including the risks, benefits and alternatives for the proposed anesthesia with the patient or authorized representative who has indicated his/her understanding and acceptance.     Dental advisory given  Plan Discussed with: CRNA and Surgeon  Anesthesia Plan Comments: (See PAT note 12/10/2020, Konrad Felix Ward, PA-C)       Anesthesia Quick Evaluation

## 2020-12-12 NOTE — Progress Notes (Signed)
Anesthesia Chart Review   Case: [id number] Date/Time: 12/19/20 0715   Procedure: TOTAL KNEE ARTHROPLASTY (Left: Knee)   Anesthesia type: Spinal   Pre-op diagnosis: LEFT KNEE DEGENERATIVE JOINT DISEASE   Location: [facility] ROOM 08 / [facility]   Surgeons: Dorna Leitz, MD       DISCUSSION:80 y.o. former smoker with h/o GERD, HTN, atrial flutter, sleep apnea, left knee djd scheduled for above procedure 12/19/2020 with Dr. Dorna Leitz.   Pt seen by cardiology 12/08/2020. Per OV note, "Chart reviewed as part of pre-operative protocol coverage. Given past medical history and time since last visit, based on ACC/AHA guidelines, KALEE MCCLENATHAN would be at acceptable risk for the planned procedure without further cardiovascular testing.    His RCRI is a class II risk, 0.9% risk of major cardiac event.  He is able to complete greater than 4 METS of physical activity."  Anticipate pt can proceed with planned procedure barring acute status change.   VS: BP (!) 150/67   Pulse 68   Temp 36.7 C (Oral)   Ht 5\' 6"  (1.676 m)   Wt 66.2 kg   SpO2 97%   BMI 23.57 kg/m   PROVIDERS: Plotnikov, Evie Lacks, MD is PCP   Fransico Him, MD is Cardiologist  LABS: Labs reviewed: Acceptable for surgery. (all labs ordered are listed, but only abnormal results are displayed)  Labs Reviewed  CBC WITH DIFFERENTIAL/PLATELET - Abnormal; Notable for the following components:      Result Value   RBC 4.17 (*)    All other components within normal limits  COMPREHENSIVE METABOLIC PANEL - Abnormal; Notable for the following components:   Glucose, Bld 105 (*)    BUN 24 (*)    All other components within normal limits  SURGICAL PCR SCREEN  PROTIME-INR  APTT  URINALYSIS, ROUTINE W REFLEX MICROSCOPIC  TYPE AND SCREEN     IMAGES:   EKG: 12/08/2020 Rate 59 bpm  Sinus bradycardia with 1st degree AV block  Nonspecific T wave abnormality  CV: Stress Test 02/13/2019 The left ventricular ejection fraction is normal  (55-65%). Nuclear stress EF: 63%. Blood pressure demonstrated a normal response to exercise. There was no ST segment deviation noted during stress. The study is normal. This is a low risk study.   Normal pharmacologic nuclear study with no evidence for prior infarct or ischemia.   Echo 02/13/2019  1. Left ventricular ejection fraction, by visual estimation, is 60 to  65%. The left ventricle has normal function. There is mildly increased  left ventricular hypertrophy.   2. The left ventricle has no regional wall motion abnormalities.   3. Global right ventricle has normal systolic function.The right  ventricular size is normal. No increase in right ventricular wall  thickness.   4. Left atrial size was normal.   5. Right atrial size was normal.   6. Presence of pericardial fat pad.   7. Trivial pericardial effusion is present.   8. The mitral valve is degenerative. Mild mitral valve regurgitation.   9. The tricuspid valve is grossly normal.  10. The aortic valve is tricuspid. Aortic valve regurgitation is not  visualized. No evidence of aortic valve sclerosis or stenosis.  11. Pulmonic regurgitation is mild.  12. The pulmonic valve was grossly normal. Pulmonic valve regurgitation is  mild.  13. Normal pulmonary artery systolic pressure.  14. The tricuspid regurgitant velocity is 1.91 m/s, and with an assumed  right atrial pressure of 3 mmHg, the estimated right ventricular systolic  pressure is normal at 17.6 mmHg.  15. The inferior vena cava is normal in size with greater than 50%  respiratory variability, suggesting right atrial pressure of 3 mmHg.  16. A prior study was performed on 07/08/2015.  17. No significant change from prior study. Past Medical History:  Diagnosis Date   Acute bronchitis 08/11/2016   6/18  H/o asthma as a child...   ADD (attention deficit disorder)    Anemia in chronic illness 06/15/2013   Followed as Primary Care Patient/ [facility]/ [facility] - Fe  studies ok 06/15/13 > stool cards received 06/27/2013 > neg x 5> down to 11.4 08/23/13  > referred for hematology 09/04/2013> Alvy Bimler felt it was due to low Testosterone  Resolved as of 08/14/2015         Anxiety    Asthma    AS A CHILD   Ataxia 06/07/2016   2018 poss age related/meds   Atrial flutter ([place])    a. s/p RFCA 07/2011.  Xarelto d/c 08/2011.   Bladder neck obstruction 10/04/2016   Dr Karsten Ro Rapaflo   Cataracts, bilateral    Colon polyps 10/04/2016   Dr Ardis Hughs Due colon 02/2019   Conjunctivitis 09/14/2013   Cough 02/19/2014   DgEs   01/23/16 Unremarkable esophagram. Allergy profile 04/05/2016 >  Eos 0.4 /  IgE  284  RAST pos grass/trees/dust > rec allergy eval    Depression    Diarrhea 02/11/2017   Disturbance in sleep behavior 04/30/2010    Trazodone  Lorazepam prn   DIZZINESS 01/24/2008   Qualifier: Diagnosis of  By: Royal Piedra NP, Tammy     DJD (degenerative joint disease) of knee    Drug-induced memory loss ([place]) 02/22/2014   D/c statin 01/2014 > improved  D/c Trintellix Dr Tish Frederickson   Dyspnea 06/18/2011   Dysrhythmia    hx a-flutter with successful ablation 2013   Essential hypertension 06/18/2011   NAS diet ASA - unable to use due to h/o AVM bleeding - colon 2003   FATIGUE, ACUTE 03/23/2007   Qualifier: Diagnosis of  By: Melvyn Novas MD, Christena Deem    GERD (gastroesophageal reflux disease)    Heart murmur    a. 06/2011 Echo: 65-70%, mild MR   History of lower GI bleeding 09/26/2012   Followed in GI clinic/ [facility]/ [place]  - colonoscopy 05/22/2001  And 06/14/11 > c/w hemorrhoids and cecal avm    Hyperlipidemia    Hyperlipidemia LDL goal <130 03/22/2007   Followed as Primary Care Patient/  Health[facility]/ [facility]   Zetia    S/P cardiac cath    a. in 1990's - Dr. Gwenlyn Found - WNL   Scalp laceration 11/20/2014   11/01/14 > staples removed 11/18/2014     Sleep apnea    could not tolerate cpap, uses mouth guard now   TESTICULAR HYPOFUNCTION 09/15/2009   F/u urology on testosterone gel      Past  Surgical History:  Procedure Laterality Date   ATRIAL FLUTTER ABLATION N/A 08/06/2011   Procedure: ATRIAL FLUTTER ABLATION;  Surgeon: Deboraha Sprang, MD;  Location: [facility] CATH LAB;  Service: Cardiovascular;  Laterality: N/A;   COLONOSCOPY  02/27/2016   LESION REMOVAL N/A 08/02/2014   Procedure: EXCISION UPPER LIP VASCULAR ANOMALY ;  Surgeon: Irene Limbo, MD;  Location: [place];  Service: Plastics;  Laterality: N/A;   MASTOIDECTOMY     POLYPECTOMY     TONSILLECTOMY     WRIST FRACTURE SURGERY  2007   left  MEDICATIONS:  Cholecalciferol (VITAMIN D3) 2000 units capsule   cyanocobalamin 1000 MCG tablet   Evolocumab (REPATHA SURECLICK) 030 MG/ML SOAJ   ezetimibe (ZETIA) 10 MG tablet   Multiple Vitamin tablet   Multiple Vitamins-Minerals (ZINC PO)   RAPAFLO 8 MG CAPS capsule   ST JOHNS WORT PO   No current facility-administered medications for this encounter.     Konrad Felix Ward, PA-C [facility] Pre-Surgical Testing [phone]

## 2020-12-17 ENCOUNTER — Other Ambulatory Visit: Payer: Self-pay | Admitting: Orthopedic Surgery

## 2020-12-17 LAB — SARS CORONAVIRUS 2 (TAT 6-24 HRS): SARS Coronavirus 2: NEGATIVE

## 2020-12-19 ENCOUNTER — Observation Stay (HOSPITAL_COMMUNITY)
Admission: RE | Admit: 2020-12-19 | Discharge: 2020-12-20 | Disposition: A | Discharge status: Home / Self Care | Payer: Medicare Other | Attending: Orthopedic Surgery | Admitting: Orthopedic Surgery

## 2020-12-19 ENCOUNTER — Other Ambulatory Visit: Payer: Self-pay

## 2020-12-19 ENCOUNTER — Ambulatory Visit (HOSPITAL_COMMUNITY): Payer: Medicare Other | Admitting: Anesthesiology

## 2020-12-19 ENCOUNTER — Ambulatory Visit (HOSPITAL_COMMUNITY): Payer: Medicare Other | Admitting: Physician Assistant

## 2020-12-19 ENCOUNTER — Encounter (HOSPITAL_COMMUNITY): Payer: Self-pay | Admitting: Orthopedic Surgery

## 2020-12-19 ENCOUNTER — Encounter (HOSPITAL_COMMUNITY)
Admission: RE | Disposition: A | Discharge status: Home / Self Care | Payer: Self-pay | Source: Home / Self Care | Attending: Orthopedic Surgery

## 2020-12-19 DIAGNOSIS — G8918 Other acute postprocedural pain: Secondary | ICD-10-CM | POA: Diagnosis not present

## 2020-12-19 DIAGNOSIS — Z87891 Personal history of nicotine dependence: Secondary | ICD-10-CM | POA: Insufficient documentation

## 2020-12-19 DIAGNOSIS — I10 Essential (primary) hypertension: Secondary | ICD-10-CM | POA: Insufficient documentation

## 2020-12-19 DIAGNOSIS — M1712 Unilateral primary osteoarthritis, left knee: Principal | ICD-10-CM | POA: Insufficient documentation

## 2020-12-19 DIAGNOSIS — Z96652 Presence of left artificial knee joint: Secondary | ICD-10-CM | POA: Diagnosis not present

## 2020-12-19 DIAGNOSIS — M21162 Varus deformity, not elsewhere classified, left knee: Secondary | ICD-10-CM | POA: Diagnosis not present

## 2020-12-19 DIAGNOSIS — J45909 Unspecified asthma, uncomplicated: Secondary | ICD-10-CM | POA: Insufficient documentation

## 2020-12-19 DIAGNOSIS — S83142A Lateral subluxation of proximal end of tibia, left knee, initial encounter: Secondary | ICD-10-CM | POA: Diagnosis not present

## 2020-12-19 HISTORY — PX: TOTAL KNEE ARTHROPLASTY: SHX125

## 2020-12-19 LAB — TYPE AND SCREEN
ABO/RH(D): O POS
Antibody Screen: NEGATIVE

## 2020-12-19 LAB — ABO/RH: ABO/RH(D): O POS

## 2020-12-19 SURGERY — ARTHROPLASTY, KNEE, TOTAL
Anesthesia: Spinal | Site: Knee | Laterality: Left

## 2020-12-19 MED ORDER — BUPIVACAINE IN DEXTROSE 0.75-8.25 % IT SOLN
INTRATHECAL | Status: DC | PRN
  Administered 2020-12-19: 1.6 mL via INTRATHECAL

## 2020-12-19 MED ORDER — BUPIVACAINE LIPOSOME 1.3 % IJ SUSP: 20.0000 mL | Freq: Once | INTRAMUSCULAR | Status: AC

## 2020-12-19 MED ORDER — METHOCARBAMOL 500 MG PO TABS
500.0000 mg | ORAL_TABLET | Freq: Four times a day (QID) | ORAL | Status: DC | PRN
  Administered 2020-12-19 – 2020-12-20 (×4): 500 mg via ORAL
  Filled 2020-12-19 (×4): qty 1

## 2020-12-19 MED ORDER — TRAMADOL HCL 50 MG PO TABS
50.0000 mg | ORAL_TABLET | Freq: Four times a day (QID) | ORAL | Status: DC
  Administered 2020-12-19 – 2020-12-20 (×3): 50 mg via ORAL
  Filled 2020-12-19 (×3): qty 1

## 2020-12-19 MED ORDER — DOCUSATE SODIUM 100 MG PO CAPS: 100.0000 mg | ORAL_CAPSULE | Freq: Two times a day (BID) | ORAL | 0 refills | Status: DC

## 2020-12-19 MED ORDER — ACETAMINOPHEN 325 MG PO TABS: 325.0000 mg | ORAL_TABLET | Freq: Four times a day (QID) | ORAL | Status: DC | PRN

## 2020-12-19 MED ORDER — ROPIVACAINE HCL 5 MG/ML IJ SOLN
INTRAMUSCULAR | Status: DC | PRN
  Administered 2020-12-19: 25 mL via PERINEURAL

## 2020-12-19 MED ORDER — LACTATED RINGERS IV SOLN: INTRAVENOUS | Status: DC

## 2020-12-19 MED ORDER — ONDANSETRON HCL 4 MG/2ML IJ SOLN: 4.0000 mg | Freq: Once | INTRAMUSCULAR | Status: DC | PRN

## 2020-12-19 MED ORDER — OXYCODONE HCL 5 MG/5ML PO SOLN: 5.0000 mg | Freq: Once | ORAL | Status: DC | PRN

## 2020-12-19 MED ORDER — ASPIRIN EC 325 MG PO TBEC: 325.0000 mg | DELAYED_RELEASE_TABLET | Freq: Two times a day (BID) | ORAL | 0 refills | Status: DC

## 2020-12-19 MED ORDER — BUPIVACAINE-EPINEPHRINE (PF) 0.25% -1:200000 IJ SOLN
INTRAMUSCULAR | Status: AC
  Filled 2020-12-19: qty 30

## 2020-12-19 MED ORDER — OXYCODONE-ACETAMINOPHEN 5-325 MG PO TABS: 1.0000 | ORAL_TABLET | Freq: Four times a day (QID) | ORAL | 0 refills | Status: DC | PRN

## 2020-12-19 MED ORDER — ORAL CARE MOUTH RINSE: 15.0000 mL | Freq: Once | OROMUCOSAL | Status: AC

## 2020-12-19 MED ORDER — TRANEXAMIC ACID-NACL 1000-0.7 MG/100ML-% IV SOLN
1000.0000 mg | INTRAVENOUS | Status: AC
  Administered 2020-12-19: 1000 mg via INTRAVENOUS
  Filled 2020-12-19: qty 100

## 2020-12-19 MED ORDER — ASPIRIN EC 325 MG PO TBEC
325.0000 mg | DELAYED_RELEASE_TABLET | Freq: Two times a day (BID) | ORAL | Status: DC
  Administered 2020-12-19 – 2020-12-20 (×2): 325 mg via ORAL
  Filled 2020-12-19 (×2): qty 1

## 2020-12-19 MED ORDER — POLYETHYLENE GLYCOL 3350 17 G PO PACK: 17.0000 g | PACK | Freq: Every day | ORAL | Status: DC | PRN

## 2020-12-19 MED ORDER — OMEPRAZOLE MAGNESIUM 20 MG PO TBEC: 20.0000 mg | DELAYED_RELEASE_TABLET | Freq: Every day | ORAL | 0 refills | Status: DC

## 2020-12-19 MED ORDER — CEFAZOLIN SODIUM-DEXTROSE 2-4 GM/100ML-% IV SOLN
2.0000 g | INTRAVENOUS | Status: AC
  Administered 2020-12-19: 2 g via INTRAVENOUS
  Filled 2020-12-19: qty 100

## 2020-12-19 MED ORDER — SODIUM CHLORIDE 0.9 % IR SOLN
Status: DC | PRN
  Administered 2020-12-19: 1000 mL

## 2020-12-19 MED ORDER — ALUM & MAG HYDROXIDE-SIMETH 200-200-20 MG/5ML PO SUSP: 30.0000 mL | ORAL | Status: DC | PRN

## 2020-12-19 MED ORDER — ONDANSETRON HCL 4 MG PO TABS: 4.0000 mg | ORAL_TABLET | Freq: Four times a day (QID) | ORAL | Status: DC | PRN

## 2020-12-19 MED ORDER — WATER FOR IRRIGATION, STERILE IR SOLN
Status: DC | PRN
  Administered 2020-12-19: 2000 mL

## 2020-12-19 MED ORDER — DEXAMETHASONE SODIUM PHOSPHATE 10 MG/ML IJ SOLN
10.0000 mg | Freq: Two times a day (BID) | INTRAMUSCULAR | Status: AC
  Administered 2020-12-19 (×2): 10 mg via INTRAVENOUS
  Filled 2020-12-19 (×2): qty 1

## 2020-12-19 MED ORDER — CEFAZOLIN SODIUM-DEXTROSE 2-4 GM/100ML-% IV SOLN
2.0000 g | Freq: Four times a day (QID) | INTRAVENOUS | Status: AC
  Administered 2020-12-19 (×2): 2 g via INTRAVENOUS
  Filled 2020-12-19 (×2): qty 100

## 2020-12-19 MED ORDER — DOCUSATE SODIUM 100 MG PO CAPS
100.0000 mg | ORAL_CAPSULE | Freq: Two times a day (BID) | ORAL | Status: DC
  Administered 2020-12-19 – 2020-12-20 (×2): 100 mg via ORAL
  Filled 2020-12-19 (×2): qty 1

## 2020-12-19 MED ORDER — TIZANIDINE HCL 2 MG PO TABS: 2.0000 mg | ORAL_TABLET | Freq: Three times a day (TID) | ORAL | 0 refills | Status: DC | PRN

## 2020-12-19 MED ORDER — BUPIVACAINE LIPOSOME 1.3 % IJ SUSP
INTRAMUSCULAR | Status: AC
  Filled 2020-12-19: qty 20

## 2020-12-19 MED ORDER — ONDANSETRON HCL 4 MG/2ML IJ SOLN: 4.0000 mg | Freq: Four times a day (QID) | INTRAMUSCULAR | Status: DC | PRN

## 2020-12-19 MED ORDER — CELECOXIB 200 MG PO CAPS
200.0000 mg | ORAL_CAPSULE | Freq: Every day | ORAL | 0 refills | Status: DC
Start: 2020-12-19 — End: 2021-03-16

## 2020-12-19 MED ORDER — CHLORHEXIDINE GLUCONATE 0.12 % MT SOLN
15.0000 mL | Freq: Once | OROMUCOSAL | Status: AC
  Administered 2020-12-19: 15 mL via OROMUCOSAL

## 2020-12-19 MED ORDER — BUPIVACAINE-EPINEPHRINE 0.5% -1:200000 IJ SOLN: INTRAMUSCULAR | Status: DC | PRN

## 2020-12-19 MED ORDER — BUPIVACAINE LIPOSOME 1.3 % IJ SUSP
INTRAMUSCULAR | Status: DC | PRN
  Administered 2020-12-19: 20 mL

## 2020-12-19 MED ORDER — DIPHENHYDRAMINE HCL 12.5 MG/5ML PO ELIX: 12.5000 mg | ORAL_SOLUTION | ORAL | Status: DC | PRN

## 2020-12-19 MED ORDER — POVIDONE-IODINE 10 % EX SWAB
2.0000 "application " | Freq: Once | CUTANEOUS | Status: AC
  Administered 2020-12-19: 2 via TOPICAL

## 2020-12-19 MED ORDER — OXYCODONE HCL 5 MG PO TABS
5.0000 mg | ORAL_TABLET | ORAL | Status: DC | PRN
  Administered 2020-12-19 – 2020-12-20 (×5): 10 mg via ORAL
  Filled 2020-12-19 (×5): qty 2

## 2020-12-19 MED ORDER — OXYCODONE HCL 5 MG PO TABS: 5.0000 mg | ORAL_TABLET | Freq: Once | ORAL | Status: DC | PRN

## 2020-12-19 MED ORDER — 0.9 % SODIUM CHLORIDE (POUR BTL) OPTIME
TOPICAL | Status: DC | PRN
  Administered 2020-12-19: 1000 mL

## 2020-12-19 MED ORDER — FENTANYL CITRATE (PF) 100 MCG/2ML IJ SOLN
INTRAMUSCULAR | Status: AC
  Filled 2020-12-19: qty 2

## 2020-12-19 MED ORDER — SODIUM CHLORIDE 0.9 % IV SOLN: INTRAVENOUS | Status: DC

## 2020-12-19 MED ORDER — FENTANYL CITRATE (PF) 250 MCG/5ML IJ SOLN
INTRAMUSCULAR | Status: DC | PRN
  Administered 2020-12-19: 100 ug via INTRAVENOUS

## 2020-12-19 MED ORDER — BISACODYL 5 MG PO TBEC: 5.0000 mg | DELAYED_RELEASE_TABLET | Freq: Every day | ORAL | Status: DC | PRN

## 2020-12-19 MED ORDER — HYDROMORPHONE HCL 1 MG/ML IJ SOLN
0.5000 mg | INTRAMUSCULAR | Status: DC | PRN
  Administered 2020-12-19: 1 mg via INTRAVENOUS
  Filled 2020-12-19 (×2): qty 1

## 2020-12-19 MED ORDER — PROPOFOL 500 MG/50ML IV EMUL
INTRAVENOUS | Status: DC | PRN
  Administered 2020-12-19: 100 ug/kg/min via INTRAVENOUS

## 2020-12-19 MED ORDER — EPHEDRINE SULFATE-NACL 50-0.9 MG/10ML-% IV SOSY
PREFILLED_SYRINGE | INTRAVENOUS | Status: DC | PRN
  Administered 2020-12-19 (×4): 5 mg via INTRAVENOUS

## 2020-12-19 MED ORDER — HYDROMORPHONE HCL 1 MG/ML IJ SOLN: 0.2500 mg | INTRAMUSCULAR | Status: DC | PRN

## 2020-12-19 MED ORDER — BUPIVACAINE-EPINEPHRINE (PF) 0.25% -1:200000 IJ SOLN
INTRAMUSCULAR | Status: DC | PRN
  Administered 2020-12-19: 30 mL

## 2020-12-19 MED ORDER — METHOCARBAMOL 1000 MG/10ML IJ SOLN
500.0000 mg | Freq: Four times a day (QID) | INTRAVENOUS | Status: DC | PRN
  Filled 2020-12-19: qty 5

## 2020-12-19 MED ORDER — PHENOL 1.4 % MT LIQD: 1.0000 | OROMUCOSAL | Status: DC | PRN

## 2020-12-19 MED ORDER — ASPIRIN EC 81 MG PO TBEC: 81.0000 mg | DELAYED_RELEASE_TABLET | Freq: Every day | ORAL | 0 refills | Status: DC

## 2020-12-19 MED ORDER — TRANEXAMIC ACID-NACL 1000-0.7 MG/100ML-% IV SOLN
1000.0000 mg | Freq: Once | INTRAVENOUS | Status: AC
  Administered 2020-12-19: 1000 mg via INTRAVENOUS
  Filled 2020-12-19: qty 100

## 2020-12-19 MED ORDER — MENTHOL 3 MG MT LOZG: 1.0000 | LOZENGE | OROMUCOSAL | Status: DC | PRN

## 2020-12-19 MED ORDER — TAMSULOSIN HCL 0.4 MG PO CAPS
0.8000 mg | ORAL_CAPSULE | Freq: Every day | ORAL | Status: DC
  Administered 2020-12-20: 0.8 mg via ORAL
  Filled 2020-12-19: qty 2

## 2020-12-19 MED ORDER — SODIUM CHLORIDE 0.9% FLUSH
INTRAVENOUS | Status: DC | PRN
  Administered 2020-12-19: 50 mL via INTRAVENOUS

## 2020-12-19 SURGICAL SUPPLY — 50 items
ATTUNE MED DOME PAT 38 KNEE (Knees) ×2 IMPLANT
ATTUNE PS FEM LT SZ 5 CEM KNEE (Femur) ×2 IMPLANT
ATTUNE PSRP INSR SZ5 6 KNEE (Insert) ×2 IMPLANT
BAG COUNTER SPONGE SURGICOUNT (BAG) IMPLANT
BAG SPEC THK2 15X12 ZIP CLS (MISCELLANEOUS) ×1
BAG ZIPLOCK 12X15 (MISCELLANEOUS) ×2 IMPLANT
BASE TIBIA ATTUNE KNEE SYS SZ6 (Knees) ×1 IMPLANT
BENZOIN TINCTURE PRP APPL 2/3 (GAUZE/BANDAGES/DRESSINGS) ×2 IMPLANT
BLADE SAGITTAL 25.0X1.19X90 (BLADE) ×2 IMPLANT
BLADE SAW SGTL 13.0X1.19X90.0M (BLADE) ×2 IMPLANT
BLADE SURG SZ10 CARB STEEL (BLADE) ×4 IMPLANT
BNDG ELASTIC 6X5.8 VLCR STR LF (GAUZE/BANDAGES/DRESSINGS) ×2 IMPLANT
BOOTIES KNEE HIGH SLOAN (MISCELLANEOUS) ×2 IMPLANT
BOWL SMART MIX CTS (DISPOSABLE) ×2 IMPLANT
CEMENT HV SMART SET (Cement) ×4 IMPLANT
CLSR STERI-STRIP ANTIMIC 1/2X4 (GAUZE/BANDAGES/DRESSINGS) ×2 IMPLANT
COVER SURGICAL LIGHT HANDLE (MISCELLANEOUS) ×2 IMPLANT
CUFF TOURN SGL QUICK 34 (TOURNIQUET CUFF) ×2
CUFF TRNQT CYL 34X4.125X (TOURNIQUET CUFF) ×1 IMPLANT
DECANTER SPIKE VIAL GLASS SM (MISCELLANEOUS) ×4 IMPLANT
DRAPE INCISE IOBAN 66X45 STRL (DRAPES) ×2 IMPLANT
DRAPE U-SHAPE 47X51 STRL (DRAPES) ×2 IMPLANT
DRSG AQUACEL AG ADV 3.5X10 (GAUZE/BANDAGES/DRESSINGS) ×2 IMPLANT
DURAPREP 26ML APPLICATOR (WOUND CARE) ×2 IMPLANT
ELECT REM PT RETURN 15FT ADLT (MISCELLANEOUS) ×2 IMPLANT
GLOVE SRG 8 PF TXTR STRL LF DI (GLOVE) ×2 IMPLANT
GLOVE SURG NEOP MICRO LF SZ7.5 (GLOVE) ×4 IMPLANT
GLOVE SURG UNDER POLY LF SZ8 (GLOVE) ×4
GOWN STRL REUS W/TWL XL LVL3 (GOWN DISPOSABLE) ×4 IMPLANT
HANDPIECE INTERPULSE COAX TIP (DISPOSABLE) ×2
HOLDER FOLEY CATH W/STRAP (MISCELLANEOUS) IMPLANT
HOOD PEEL AWAY FLYTE STAYCOOL (MISCELLANEOUS) ×6 IMPLANT
KIT TURNOVER KIT A (KITS) IMPLANT
MANIFOLD NEPTUNE II (INSTRUMENTS) ×2 IMPLANT
NEEDLE HYPO 22GX1.5 SAFETY (NEEDLE) ×4 IMPLANT
NS IRRIG 1000ML POUR BTL (IV SOLUTION) ×2 IMPLANT
PACK TOTAL KNEE CUSTOM (KITS) ×2 IMPLANT
PADDING CAST COTTON 6X4 STRL (CAST SUPPLIES) ×2 IMPLANT
PROTECTOR NERVE ULNAR (MISCELLANEOUS) ×2 IMPLANT
SET HNDPC FAN SPRY TIP SCT (DISPOSABLE) ×1 IMPLANT
STRIP CLOSURE SKIN 1/2X4 (GAUZE/BANDAGES/DRESSINGS) IMPLANT
SUT MNCRL AB 3-0 PS2 18 (SUTURE) ×2 IMPLANT
SUT VIC AB 0 CT1 36 (SUTURE) ×2 IMPLANT
SUT VIC AB 1 CT1 36 (SUTURE) ×4 IMPLANT
SYR CONTROL 10ML LL (SYRINGE) ×4 IMPLANT
TIBIA ATTUNE KNEE SYS BASE SZ6 (Knees) ×2 IMPLANT
TRAY FOLEY MTR SLVR 16FR STAT (SET/KITS/TRAYS/PACK) ×2 IMPLANT
TUBE SUCTION HIGH CAP CLEAR NV (SUCTIONS) ×2 IMPLANT
WATER STERILE IRR 1000ML POUR (IV SOLUTION) ×4 IMPLANT
WRAP KNEE MAXI GEL POST OP (GAUZE/BANDAGES/DRESSINGS) ×2 IMPLANT

## 2020-12-19 NOTE — Anesthesia Procedure Notes (Signed)
Spinal  Patient location during procedure: OR Start time: 12/19/2020 7:39 AM End time: 12/19/2020 7:46 AM Reason for block: surgical anesthesia Staffing Performed: anesthesiologist  Anesthesiologist: Myrtie Soman, MD Preanesthetic Checklist Completed: patient identified, IV checked, site marked, risks and benefits discussed, surgical consent, monitors and equipment checked, pre-op evaluation and timeout performed Spinal Block Patient position: sitting Prep: Betadine Patient monitoring: heart rate, continuous pulse ox and blood pressure Approach: midline Location: L3-4 Injection technique: single-shot Needle Needle type: Sprotte  Needle gauge: 24 G Needle length: 9 cm Assessment Sensory level: T8 Events: CSF return and second provider Additional Notes

## 2020-12-19 NOTE — Progress Notes (Signed)
Orthopedic Tech Progress Note Patient Details:  [name] Browning September 09, 1940 [id number] 0-70 CPM was applied to patient's left knee in PACU. Bone Foam was also supplied to patient as well with instruction on when to use it.  Ortho Devices Type of Ortho Device: Bone foam zero knee Ortho Device/Splint Location: Left knee Ortho Device/Splint Interventions: Application   Post Interventions Patient Tolerated: Well  Andrey Hoobler E [name] 12/19/2020, 10:26 AM

## 2020-12-19 NOTE — Evaluation (Signed)
Physical Therapy Evaluation Patient Details Name: [name] Browning MRN: [id number] DOB: 10/14/1940 Today's Date: 12/19/2020  History of Present Illness  Pt s/p L TKR and with hx of ADD and DJD  Clinical Impression  Pt s/p L TKR and presents with decreased L LE strength/ROM and post op pain limiting functional mobility.  Pt should progress to dc home with family assist and reports HHPT scheduled for Sunday 12/20/20.     Recommendations for follow up therapy are one component of a multi-disciplinary discharge planning process, led by the attending physician.  Recommendations may be updated based on patient status, additional functional criteria and insurance authorization.  Follow Up Recommendations Follow physician's recommendations for discharge plan and follow up therapies    Assistance Recommended at Discharge Frequent or constant Supervision/Assistance  Functional Status Assessment Patient has had a recent decline in their functional status and demonstrates the ability to make significant improvements in function in a reasonable and predictable amount of time.  Equipment Recommendations  None recommended by PT    Recommendations for Other Services       Precautions / Restrictions Precautions Precautions: Knee;Fall Restrictions Weight Bearing Restrictions: No Other Position/Activity Restrictions: WBAT      Mobility  Bed Mobility Overal bed mobility: Needs Assistance Bed Mobility: Supine to Sit     Supine to sit: Min assist     General bed mobility comments: cues for sequence and use of R LE to self assist    Transfers Overall transfer level: Needs assistance Equipment used: Rolling walker (2 wheels) Transfers: Sit to/from Stand Sit to Stand: Min assist           General transfer comment: cues for LE management and use of UEs to self assist    Ambulation/Gait Ambulation/Gait assistance: Min assist;Min guard Gait Distance (Feet): 100 Feet Assistive device:  Rolling walker (2 wheels) Gait Pattern/deviations: Step-to pattern;Step-through pattern;Decreased step length - right;Decreased step length - left;Shuffle;Trunk flexed Gait velocity: decr   General Gait Details: cues for sequence, posture and position from [employer]            Wheelchair Mobility    Modified Rankin (Stroke Patients Only)       Balance Overall balance assessment: Needs assistance Sitting-balance support: No upper extremity supported;Feet supported Sitting balance-Leahy Scale: Good     Standing balance support: Bilateral upper extremity supported Standing balance-Leahy Scale: Poor                               Pertinent Vitals/Pain Pain Assessment: 0-10 Pain Score: 5  Pain Location: L knee Pain Descriptors / Indicators: Aching;Sore Pain Intervention(s): Limited activity within patient's tolerance;Monitored during session;Premedicated before session;Ice applied    Home Living Family/patient expects to be discharged to:: Private residence Living Arrangements: Spouse/significant other Available Help at Discharge: Family;Available 24 hours/day Type of Home: Apartment Home Access: Elevator       Home Layout: One level Home Equipment: Agricultural consultant (2 wheels)      Prior Function Prior Level of Function : Independent/Modified Independent                     Hand Dominance        Extremity/Trunk Assessment   Upper Extremity Assessment Upper Extremity Assessment: Overall WFL for tasks assessed    Lower Extremity Assessment Lower Extremity Assessment: LLE deficits/detail    Cervical / Trunk Assessment Cervical / Trunk Assessment: Normal  Communication  Communication: No difficulties  Cognition Arousal/Alertness: Awake/alert Behavior During Therapy: WFL for tasks assessed/performed Overall Cognitive Status: Within Functional Limits for tasks assessed                                           General Comments      Exercises Total Joint Exercises Ankle Circles/Pumps: AROM;Both;15 reps;Supine   Assessment/Plan    PT Assessment Patient needs continued PT services  PT Problem List Decreased strength;Decreased range of motion;Decreased activity tolerance;Decreased balance;Decreased mobility;Decreased knowledge of use of DME;Pain       PT Treatment Interventions DME instruction;Gait training;Functional mobility training;Therapeutic activities;Therapeutic exercise;Patient/family education    PT Goals (Current goals can be found in the Care Plan section)  Acute Rehab PT Goals Patient Stated Goal: Regain IND and be golfing by spring PT Goal Formulation: With patient Time For Goal Achievement: 12/26/20 Potential to Achieve Goals: Good    Frequency 7X/week   Barriers to discharge        Co-evaluation               AM-PAC PT "6 Clicks" Mobility  Outcome Measure Help needed turning from your back to your side while in a flat bed without using bedrails?: A Little Help needed moving from lying on your back to sitting on the side of a flat bed without using bedrails?: A Little Help needed moving to and from a bed to a chair (including a wheelchair)?: A Little Help needed standing up from a chair using your arms (e.g., wheelchair or bedside chair)?: A Little Help needed to walk in hospital room?: A Little Help needed climbing 3-5 steps with a railing? : A Little 6 Click Score: 18    End of Session Equipment Utilized During Treatment: Gait belt Activity Tolerance: Patient tolerated treatment well Patient left: in chair;with call bell/phone within reach;with chair alarm set;with family/visitor present Nurse Communication: Mobility status PT Visit Diagnosis: Difficulty in walking, not elsewhere classified (R26.2)    Time: 4098-1191 PT Time Calculation (min) (ACUTE ONLY): 31 min   Charges:   PT Evaluation $PT Eval Low Complexity: 1 Low PT Treatments $Gait  Training: 8-22 mins        Mauro Kaufmann PT Acute Rehabilitation Services Pager [phone] Office [phone]   Zenon Leaf 12/19/2020, 3:31 PM

## 2020-12-19 NOTE — Transfer of Care (Signed)
Immediate Anesthesia Transfer of Care Note  Patient: [name] Browning  Procedure(s) Performed: TOTAL KNEE ARTHROPLASTY (Left: Knee)  Patient Location: PACU  Anesthesia Type:Regional and Spinal  Level of Consciousness: awake and alert   Airway & Oxygen Therapy: Patient Spontanous Breathing and Patient connected to face mask oxygen  Post-op Assessment: Report given to RN and Post -op Vital signs reviewed and stable  Post vital signs: Reviewed and stable  Last Vitals:  Vitals Value Taken Time  BP    Temp    Pulse 87 12/19/20 0936  Resp 7 12/19/20 0936  SpO2 100 % 12/19/20 0936  Vitals shown include unvalidated device data.  Last Pain:  Vitals:   12/19/20 0546  TempSrc:   PainSc: 0-No pain         Complications: No notable events documented.

## 2020-12-19 NOTE — Anesthesia Postprocedure Evaluation (Signed)
Anesthesia Post Note  Patient: SIRR KABEL  Procedure(s) Performed: TOTAL KNEE ARTHROPLASTY (Left: Knee)     Patient location during evaluation: PACU Anesthesia Type: Spinal Level of consciousness: oriented and awake and alert Pain management: pain level controlled Vital Signs Assessment: post-procedure vital signs reviewed and stable Respiratory status: spontaneous breathing, respiratory function stable and patient connected to nasal cannula oxygen Cardiovascular status: blood pressure returned to baseline and stable Postop Assessment: no headache, no backache and no apparent nausea or vomiting Anesthetic complications: no   No notable events documented.  Last Vitals:  Vitals:   12/19/20 1030 12/19/20 1045  BP: (!) 149/77 (!) 155/70  Pulse: 79 83  Resp: 13 14  Temp:    SpO2: 99% 98%    Last Pain:  Vitals:   12/19/20 1045  TempSrc:   PainSc: 0-No pain    LLE Motor Response: Purposeful movement (12/19/20 1045) LLE Sensation: Increased (12/19/20 1045) RLE Motor Response: Purposeful movement (12/19/20 1045) RLE Sensation: Increased (12/19/20 1045) L Sensory Level: S1-Sole of foot, small toes (12/19/20 1045) R Sensory Level: S1-Sole of foot, small toes (12/19/20 1045)  Laporchia Nakajima S

## 2020-12-19 NOTE — H&P (Signed)
TOTAL KNEE ADMISSION H&P  Patient is being admitted for left total knee arthroplasty.  Subjective:  Chief Complaint:left knee pain.  HPI: [name] Browning, 80 y.o. male, has a history of pain and functional disability in the left knee due to arthritis and has failed non-surgical conservative treatments for greater than 12 weeks to includeNSAID's and/or analgesics, corticosteriod injections, viscosupplementation injections, flexibility and strengthening excercises, weight reduction as appropriate, and activity modification.  Onset of symptoms was gradual, starting 2 years ago with gradually worsening course since that time. The patient noted no past surgery on the left knee(s).  Patient currently rates pain in the left knee(s) at 9 out of 10 with activity. Patient has night pain, worsening of pain with activity and weight bearing, pain that interferes with activities of daily living, pain with passive range of motion, and joint swelling.  Patient has evidence of subchondral sclerosis, periarticular osteophytes, and joint space narrowing by imaging studies. This patient has had  failure of all reasonable conservative care . There is no active infection.  Patient Active Problem List   Diagnosis Date Noted   Well adult exam 10/27/2020   Syncope, vasovagal 07/07/2020   Aortic atherosclerosis ([place]) 02/05/2019   Diarrhea 02/11/2017   Colon polyps 10/04/2016   Bladder neck obstruction 10/04/2016   Acute bronchitis 08/11/2016   Ataxia 06/07/2016   Scalp laceration 11/20/2014   Drug-induced memory loss ([place]) 02/22/2014   Cough 02/19/2014   Conjunctivitis 09/14/2013   Anemia in chronic illness 06/15/2013   History of lower GI bleeding 09/26/2012   Depression 03/01/2012   Essential hypertension 06/18/2011   Dyspnea 06/18/2011   Atrial flutter ([place]) 06/18/2011   Disturbance in sleep behavior 04/30/2010   TESTICULAR HYPOFUNCTION 09/15/2009   DIZZINESS 01/24/2008   FATIGUE, ACUTE 03/23/2007    Hyperlipidemia 03/22/2007   Past Medical History:  Diagnosis Date   Acute bronchitis 08/11/2016   6/18  H/o asthma as a child...   ADD (attention deficit disorder)    Anemia in chronic illness 06/15/2013   Followed as Primary Care Patient/ [facility]/ [facility] - Fe studies ok 06/15/13 > stool cards received 06/27/2013 > neg x 5> down to 11.4 08/23/13  > referred for hematology 09/04/2013> Alvy Bimler felt it was due to low Testosterone  Resolved as of 08/14/2015         Anxiety    Asthma    AS A CHILD   Ataxia 06/07/2016   2018 poss age related/meds   Atrial flutter ([place])    a. s/p RFCA 07/2011.  Xarelto d/c 08/2011.   Bladder neck obstruction 10/04/2016   Dr Karsten Ro Rapaflo   Cataracts, bilateral    Colon polyps 10/04/2016   Dr Ardis Hughs Due colon 02/2019   Conjunctivitis 09/14/2013   Cough 02/19/2014   DgEs   01/23/16 Unremarkable esophagram. Allergy profile 04/05/2016 >  Eos 0.4 /  IgE  284  RAST pos grass/trees/dust > rec allergy eval    Depression    Diarrhea 02/11/2017   Disturbance in sleep behavior 04/30/2010    Trazodone  Lorazepam prn   DIZZINESS 01/24/2008   Qualifier: Diagnosis of  By: Royal Piedra NP, Tammy     DJD (degenerative joint disease) of knee    Drug-induced memory loss ([place]) 02/22/2014   D/c statin 01/2014 > improved  D/c Trintellix Dr Tish Frederickson   Dyspnea 06/18/2011   Dysrhythmia    hx a-flutter with successful ablation 2013   Essential hypertension 06/18/2011   NAS diet ASA - unable to use due  to h/o AVM bleeding - colon 2003   FATIGUE, ACUTE 03/23/2007   Qualifier: Diagnosis of  By: Melvyn Novas MD, Christena Deem    GERD (gastroesophageal reflux disease)    Heart murmur    a. 06/2011 Echo: 65-70%, mild MR   History of lower GI bleeding 09/26/2012   Followed in GI clinic/ [facility]/ Sharlett Iles  - colonoscopy 05/22/2001  And 06/14/11 > c/w hemorrhoids and cecal avm    Hyperlipidemia    Hyperlipidemia LDL goal <130 03/22/2007   Followed as Primary Care Patient/ [facility]/ [facility]   Zetia     S/P cardiac cath    a. in 1990's - Dr. Gwenlyn Found - WNL   Scalp laceration 11/20/2014   11/01/14 > staples removed 11/18/2014     Sleep apnea    could not tolerate cpap, uses mouth guard now   TESTICULAR HYPOFUNCTION 09/15/2009   F/u urology on testosterone gel      Past Surgical History:  Procedure Laterality Date   ATRIAL FLUTTER ABLATION N/A 08/06/2011   Procedure: ATRIAL FLUTTER ABLATION;  Surgeon: Deboraha Sprang, MD;  Location: [facility] CATH LAB;  Service: Cardiovascular;  Laterality: N/A;   COLONOSCOPY  02/27/2016   LESION REMOVAL N/A 08/02/2014   Procedure: EXCISION UPPER LIP VASCULAR ANOMALY ;  Surgeon: Irene Limbo, MD;  Location: [facility];  Service: Plastics;  Laterality: N/A;   MASTOIDECTOMY     POLYPECTOMY     TONSILLECTOMY     WRIST FRACTURE SURGERY  2007   left    Current Facility-Administered Medications  Medication Dose Route Frequency Provider Last Rate Last Admin   bupivacaine liposome (EXPAREL) 1.3 % injection 266 mg  20 mL Other Once Dorna Leitz, MD       ceFAZolin (ANCEF) IVPB 2g/100 mL premix  2 g Intravenous On Call to OR Dorna Leitz, MD       lactated ringers infusion   Intravenous Continuous Merlinda Frederick, MD 10 mL/hr at 12/19/20 0548 New Bag at 12/19/20 0548   tranexamic acid (CYKLOKAPRON) IVPB 1,000 mg  1,000 mg Intravenous To OR Dorna Leitz, MD       Allergies  Allergen Reactions   Crestor [Rosuvastatin]     Memory issues   Trintellix [Vortioxetine]     Memory loss   Zocor [Simvastatin]     Memory issues    Social History   Tobacco Use   Smoking status: Former    Packs/day: 3.00    Years: 7.00    Pack years: 21.00    Types: Cigarettes    Start date: 1963    Quit date: 02/16/1968    Years since quitting: 52.8   Smokeless tobacco: Never  Substance Use Topics   Alcohol use: Not Currently    Family History  Problem Relation Age of Onset   Heart disease Mother    Leukemia Mother    Prostate cancer Father    Stroke Father     Hypertension Brother    Prostate cancer Brother    Colon cancer Neg Hx    Stomach cancer Neg Hx    Heart attack Neg Hx    Esophageal cancer Neg Hx    Rectal cancer Neg Hx    Colon polyps Neg Hx      Review of Systems ROS  Objective:  Physical Exam  Vital signs in last 24 hours: Temp:  [97.7 F (36.5 C)] 97.7 F (36.5 C) (11/04 0540) Pulse Rate:  [65] 65 (11/04 0540) Resp:  [  17] 17 (11/04 0540) BP: (164)/(58) 164/58 (11/04 0540) SpO2:  [99 %] 99 % (11/04 0540) Weight:  [66.2 kg] 66.2 kg (11/04 0546) Well-developed well-nourished patient in no acute distress. Alert and oriented x3 HEENT:within normal limits Cardiac: Regular rate and rhythm Pulmonary: Lungs clear to auscultation Abdomen: Soft and nontender.  Normal active bowel sounds  Musculoskeletal: ( left knee: Painful range of motion.  Limited range of motion.  No instability.  Trace effusion.  No erythema or warmth.  Neurovascular intact distally. Labs: Recent Results (from the past 2160 hour(s))  PSA     Status: None   Collection Time: 10/27/20 11:53 AM  Result Value Ref Range   PSA 0.64 0.10 - 4.00 ng/mL    Comment: Test performed using Access Hybritech PSA Assay, a parmagnetic partical, chemiluminecent immunoassay.  Lipid panel     Status: None   Collection Time: 10/27/20 11:53 AM  Result Value Ref Range   Cholesterol 117 0 - 200 mg/dL    Comment: ATP III Classification       Desirable:  < 200 mg/dL               Borderline High:  200 - 239 mg/dL          High:  > = 240 mg/dL   Triglycerides 137.0 0.0 - 149.0 mg/dL    Comment: Normal:  <150 mg/dLBorderline High:  150 - 199 mg/dL   HDL 64.70 >39.00 mg/dL   VLDL 27.4 0.0 - 40.0 mg/dL   LDL Cholesterol 25 0 - 99 mg/dL   Total CHOL/HDL Ratio 2     Comment:                Men          Women1/2 Average Risk     3.4          3.3Average Risk          5.0          4.42X Average Risk          9.6          7.13X Average Risk          15.0          11.0                        NonHDL 52.07     Comment: NOTE:  Non-HDL goal should be 30 mg/dL higher than patient's LDL goal (i.e. LDL goal of < 70 mg/dL, would have non-HDL goal of < 100 mg/dL)  Iron, TIBC and Ferritin Panel     Status: None   Collection Time: 10/27/20 11:53 AM  Result Value Ref Range   Iron 81 50 - 180 mcg/dL   TIBC 340 250 - 425 mcg/dL (calc)   %SAT 24 20 - 48 % (calc)   Ferritin 76 24 - 380 ng/mL  Urinalysis     Status: None   Collection Time: 10/27/20 11:53 AM  Result Value Ref Range   Color, Urine YELLOW Yellow;Lt. Yellow;Straw;Dark Yellow;Amber;Green;Red;Brown   APPearance CLEAR Clear;Turbid;Slightly Cloudy;Cloudy   Specific Gravity, Urine 1.020 1.000 - 1.030   pH 6.0 5.0 - 8.0   Total Protein, Urine NEGATIVE Negative   Urine Glucose NEGATIVE Negative   Ketones, ur NEGATIVE Negative   Bilirubin Urine NEGATIVE Negative   Hgb urine dipstick NEGATIVE Negative   Urobilinogen, UA 0.2 0.0 - 1.0   Leukocytes,Ua NEGATIVE Negative   Nitrite  NEGATIVE Negative  TSH     Status: None   Collection Time: 10/27/20 11:53 AM  Result Value Ref Range   TSH 3.24 0.35 - 5.50 uIU/mL  CBC with Differential/Platelet     Status: Abnormal   Collection Time: 10/27/20 11:53 AM  Result Value Ref Range   WBC 6.7 4.0 - 10.5 K/uL   RBC 4.14 (L) 4.22 - 5.81 Mil/uL   Hemoglobin 12.8 (L) 13.0 - 17.0 g/dL   HCT 38.1 (L) 39.0 - 52.0 %   MCV 91.8 78.0 - 100.0 fl   MCHC 33.7 30.0 - 36.0 g/dL   RDW 13.0 11.5 - 15.5 %   Platelets 339.0 150.0 - 400.0 K/uL   Neutrophils Relative % 71.8 43.0 - 77.0 %   Lymphocytes Relative 18.2 12.0 - 46.0 %   Monocytes Relative 7.5 3.0 - 12.0 %   Eosinophils Relative 2.1 0.0 - 5.0 %   Basophils Relative 0.4 0.0 - 3.0 %   Neutro Abs 4.8 1.4 - 7.7 K/uL   Lymphs Abs 1.2 0.7 - 4.0 K/uL   Monocytes Absolute 0.5 0.1 - 1.0 K/uL   Eosinophils Absolute 0.1 0.0 - 0.7 K/uL   Basophils Absolute 0.0 0.0 - 0.1 K/uL  Comprehensive metabolic panel     Status: Abnormal   Collection Time:  10/27/20 11:53 AM  Result Value Ref Range   Sodium 141 135 - 145 mEq/L   Potassium 4.2 3.5 - 5.1 mEq/L   Chloride 104 96 - 112 mEq/L   CO2 28 19 - 32 mEq/L   Glucose, Bld 111 (H) 70 - 99 mg/dL   BUN 23 6 - 23 mg/dL   Creatinine, Ser 1.13 0.40 - 1.50 mg/dL   Total Bilirubin 0.3 0.2 - 1.2 mg/dL   Alkaline Phosphatase 89 39 - 117 U/L   AST 14 0 - 37 U/L   ALT 12 0 - 53 U/L   Total Protein 7.0 6.0 - 8.3 g/dL   Albumin 4.3 3.5 - 5.2 g/dL   GFR 61.74 >60.00 mL/min    Comment: Calculated using the CKD-EPI Creatinine Equation (2021)   Calcium 9.9 8.4 - 10.5 mg/dL  Surgical pcr screen     Status: None   Collection Time: 12/10/20 11:06 AM   Specimen: Nasal Mucosa; Nasal Swab  Result Value Ref Range   MRSA, PCR NEGATIVE NEGATIVE   Staphylococcus aureus NEGATIVE NEGATIVE    Comment: (NOTE) The Xpert SA Assay (FDA approved for NASAL specimens in patients 3 years of age and older), is one component of a comprehensive surveillance program. It is not intended to diagnose infection nor to guide or monitor treatment. Performed at [facility], [place] [address], [place], [place] [address]   CBC WITH DIFFERENTIAL     Status: Abnormal   Collection Time: 12/10/20 11:06 AM  Result Value Ref Range   WBC 5.5 4.0 - 10.5 K/uL   RBC 4.17 (L) 4.22 - 5.81 MIL/uL   Hemoglobin 13.0 13.0 - 17.0 g/dL   HCT 39.0 39.0 - 52.0 %   MCV 93.5 80.0 - 100.0 fL   MCH 31.2 26.0 - 34.0 pg   MCHC 33.3 30.0 - 36.0 g/dL   RDW 12.6 11.5 - 15.5 %   Platelets 324 150 - 400 K/uL   nRBC 0.0 0.0 - 0.2 %   Neutrophils Relative % 61 %   Neutro Abs 3.3 1.7 - 7.7 K/uL   Lymphocytes Relative 24 %   Lymphs Abs 1.3 0.7 - 4.0 K/uL  Monocytes Relative 11 %   Monocytes Absolute 0.6 0.1 - 1.0 K/uL   Eosinophils Relative 4 %   Eosinophils Absolute 0.2 0.0 - 0.5 K/uL   Basophils Relative 0 %   Basophils Absolute 0.0 0.0 - 0.1 K/uL   Immature Granulocytes 0 %   Abs Immature Granulocytes 0.02 0.00 - 0.07 K/uL     Comment: Performed at [facility], [place] [address] [place], [place] [address]  Comprehensive metabolic panel     Status: Abnormal   Collection Time: 12/10/20 11:06 AM  Result Value Ref Range   Sodium 140 135 - 145 mmol/L   Potassium 4.3 3.5 - 5.1 mmol/L   Chloride 105 98 - 111 mmol/L   CO2 26 22 - 32 mmol/L   Glucose, Bld 105 (H) 70 - 99 mg/dL    Comment: Glucose reference range applies only to samples taken after fasting for at least 8 hours.   BUN 24 (H) 8 - 23 mg/dL   Creatinine, Ser 1.12 0.61 - 1.24 mg/dL   Calcium 9.4 8.9 - 10.3 mg/dL   Total Protein 8.1 6.5 - 8.1 g/dL   Albumin 4.4 3.5 - 5.0 g/dL   AST 22 15 - 41 U/L   ALT 16 0 - 44 U/L   Alkaline Phosphatase 91 38 - 126 U/L   Total Bilirubin 0.5 0.3 - 1.2 mg/dL   GFR, Estimated >60 >60 mL/min    Comment: (NOTE) Calculated using the CKD-EPI Creatinine Equation (2021)    Anion gap 9 5 - 15    Comment: Performed at [facility], [place] [address], [place], [place] [address]  Protime-INR     Status: None   Collection Time: 12/10/20 11:06 AM  Result Value Ref Range   Prothrombin Time 12.6 11.4 - 15.2 seconds   INR 0.9 0.8 - 1.2    Comment: (NOTE) INR goal varies based on device and disease states. Performed at [facility], [place] [address], [place], [place] [address]   APTT     Status: None   Collection Time: 12/10/20 11:06 AM  Result Value Ref Range   aPTT 30 24 - 36 seconds    Comment: Performed at [facility], [place] [address], [place], [place] [address]  Urinalysis, Routine w reflex microscopic Urine, Clean Catch     Status: None   Collection Time: 12/10/20 11:06 AM  Result Value Ref Range   Color, Urine YELLOW YELLOW   APPearance CLEAR CLEAR   Specific Gravity, Urine 1.016 1.005 - 1.030   pH 5.0 5.0 - 8.0   Glucose, UA NEGATIVE NEGATIVE mg/dL   Hgb urine dipstick NEGATIVE NEGATIVE   Bilirubin Urine NEGATIVE NEGATIVE   Ketones, ur  NEGATIVE NEGATIVE mg/dL   Protein, ur NEGATIVE NEGATIVE mg/dL   Nitrite NEGATIVE NEGATIVE   Leukocytes,Ua NEGATIVE NEGATIVE    Comment: Performed at [facility], [place] [address] [place], [place] [address]  Type and screen Order type and screen if day of surgery is less than 15 days from draw of preadmission visit or order morning of surgery if day of surgery is greater than 6 days from preadmission visit.     Status: None   Collection Time: 12/10/20 11:06 AM  Result Value Ref Range   ABO/RH(D) O POS    Antibody Screen NEG    Sample Expiration 12/24/2020,2359    Extend sample reason      NO TRANSFUSIONS OR PREGNANCY IN THE PAST 3  MONTHS Performed at [facility], [place] [address] [place], [place] [address]   SARS Coronavirus 2 (TAT 6-24 hrs)     Status: None   Collection Time: 12/17/20 12:00 AM  Result Value Ref Range   SARS Coronavirus 2 RESULT: NEGATIVE     Comment: RESULT: NEGATIVESARS-CoV-2 INTERPRETATION:A NEGATIVE  test result means that SARS-CoV-2 RNA was not present in the specimen above the limit of detection of this test. This does not preclude a possible SARS-CoV-2 infection and should not be used as the  sole basis for patient management decisions. Negative results must be combined with clinical observations, patient history, and epidemiological information. Optimum specimen types and timing for peak viral levels during infections caused by SARS-CoV-2  have not been determined. Collection of multiple specimens or types of specimens may be necessary to detect virus. Improper specimen collection and handling, sequence variability under primers/probes, or organism present below the limit of detection may  lead to false negative results. Positive and negative predictive values of testing are highly dependent on prevalence. False negative test results are more likely when prevalence of disease is high.The expected result is NEGATIVE.Fact S heet for   Healthcare Providers: [email or web address].no Sheet for Patients: SalonLookup.es Reference Range - Negative      Estimated body mass index is 23.56 kg/m as calculated from the following:   Height as of 12/10/20: 5\' 6"  (1.676 m).   Weight as of this encounter: 66.2 kg.   Imaging Review Plain radiographs demonstrate severe degenerative joint disease of the left knee(s). The overall alignment ismild varus. The bone quality appears to be fair for age and reported activity level.      Assessment/Plan:  End stage arthritis, left knee   The patient history, physical examination, clinical judgment of the provider and imaging studies are consistent with end stage degenerative joint disease of the left knee(s) and total knee arthroplasty is deemed medically necessary. The treatment options including medical management, injection therapy arthroscopy and arthroplasty were discussed at length. The risks and benefits of total knee arthroplasty were presented and reviewed. The risks due to aseptic loosening, infection, stiffness, patella tracking problems, thromboembolic complications and other imponderables were discussed. The patient acknowledged the explanation, agreed to proceed with the plan and consent was signed. Patient is being admitted for inpatient treatment for surgery, pain control, PT, OT, prophylactic antibiotics, VTE prophylaxis, progressive ambulation and ADL's and discharge planning. The patient is planning to be discharged home with home health services     Patient's anticipated LOS is less than 2 midnights, meeting these requirements: - Younger than 79 - Lives within 1 hour of care - Has a competent adult at home to recover with post-op recover - NO history of  - Chronic pain requiring opiods  - Diabetes  - Coronary Artery Disease  - Heart failure  - Heart attack  - Stroke  - DVT/VTE  - Cardiac arrhythmia  -  Respiratory Failure/COPD  - Renal failure  - Anemia  - Advanced Liver disease

## 2020-12-19 NOTE — Anesthesia Procedure Notes (Signed)
Anesthesia Procedure Image    

## 2020-12-19 NOTE — Op Note (Signed)
PATIENT ID:      [name] Browning  MRN:     [id number] DOB/AGE:    1941/01/31 / 80 y.o.       OPERATIVE REPORT   DATE OF PROCEDURE:  12/19/2020      PREOPERATIVE DIAGNOSIS:   LEFT KNEE DEGENERATIVE JOINT DISEASE      Estimated body mass index is 23.56 kg/m as calculated from the following:   Height as of 12/10/20: 5\' 6"  (1.676 m).   Weight as of this encounter: 66.2 kg.                                                       POSTOPERATIVE DIAGNOSIS:   Same                                                                  PROCEDURE:  Procedure(s): TOTAL KNEE ARTHROPLASTY Using DepuyAttune RP implants #5 Femur, #6Tibia, 6 mm Attune RP bearing, 38 Patella    SURGEON: Alta Corning  ASSISTANT:   Jimj Bethune PA-C   (Present and scrubbed throughout the case, critical for assistance with exposure, retraction, instrumentation, and closure.)        ANESTHESIA: spinal, 20cc Exparel, 50cc 0.25% Marcaine EBL: min cc FLUID REPLACEMENT: unk cc crystaloid TOURNIQUET: DRAINS: None TRANEXAMIC ACID: 1gm IV, 2gm topical COMPLICATIONS:  None         INDICATIONS FOR PROCEDURE: The patient has  LEFT KNEE DEGENERATIVE JOINT DISEASE, mild varus deformities, XR shows bone on bone arthritis, lateral subluxation of tibia. Patient has failed all conservative measures including anti-inflammatory medicines, narcotics, attempts at exercise and weight loss, cortisone injections and viscosupplementation.  Risks and benefits of surgery have been discussed, questions answered.   DESCRIPTION OF PROCEDURE: The patient identified by armband, received  IV antibiotics, in the holding area at [facility]. Patient taken to the operating room, appropriate anesthetic monitors were attached, and spinal anesthesia was  induced. IV Tranexamic acid was given.Tourniquet applied high to the operative thigh. Lateral post and foot positioner applied to the table, the lower extremity was then prepped and draped in usual sterile  fashion from the toes to the tourniquet. Time-out procedure was performed. Gaspar Skeeters PAC, was present and scrubbed throughout the case, critical for assistance with, positioning, exposure, retraction, instrumentation, and closure.The skin and subcutaneous tissue along the incision was injected with 20 cc of a mixture of Exparel and Marcaine solution, using a 20-gauge by 1-1/2 inch needle. We began the operation, with the knee flexed 130 degrees, by making the anterior midline incision starting at handbreadth above the patella going over the patella 1 cm medial to and 4 cm distal to the tibial tubercle. Small bleeders in the skin and the subcutaneous tissue identified and cauterized. Transverse retinaculum was incised and reflected medially and a medial parapatellar arthrotomy was accomplished. the patella was everted and theprepatellar fat pad resected. The superficial medial collateral ligament was then elevated from anterior to posterior along the proximal flare of the tibia and anterior half of the menisci resected. The knee was hyperflexed exposing bone on bone arthritis.  Peripheral and notch osteophytes as well as the cruciate ligaments were then resected. We continued to work our way around posteriorly along the proximal tibia, and externally rotated the tibia subluxing it out from underneath the femur. A McHale PCL retractor was placed through the notch and a lateral Hohmann retractor placed, and we then entered the proximal tibia in line with the Depuy starter drill in line with the axis of the tibia followed by an intramedullary guide rod and 0-degree posterior slope cutting guide. The tibial cutting guide, 4 degree posterior sloped, was pinned into place allowing resection of 2 mm of bone medially and 10 mm of bone laterally. Satisfied with the tibial resection, we then entered the distal femur 2 mm anterior to the PCL origin with the intramedullary guide rod and applied the distal femoral cutting guide  set at 9 mm, with 5 degrees of valgus. This was pinned along the epicondylar axis. At this point, the distal femoral cut was accomplished without difficulty. We then sized for a #5 femoral component and pinned the guide in 3 degrees of external rotation. The chamfer cutting guide was pinned into place. The anterior, posterior, and chamfer cuts were accomplished without difficulty followed by the Attune RP box cutting guide and the box cut. We also removed posterior osteophytes from the posterior femoral condyles. The posterior capsule was injected with Exparel solution. The knee was brought into full extension. We checked our extension gap and fit a 6 mm bearing. Distracting in extension with a lamina spreader,  bleeders in the posterior capsule, Posterior medial and posterior lateral gutter were cauterized.  The transexamic acid-soaked sponge was then placed in the gap of the knee in extension. The knee was flexed 30. The posterior patella cut was accomplished with the 9.5 mm Attune cutting guide, sized for a 37mm dome, and the fixation pegs drilled.The knee was then once again hyperflexed exposing the proximal tibia. We sized for a # 6 tibial base plate, applied the smokestack and the conical reamer followed by the the Delta fin keel punch. We then hammered into place the Attune RP trial femoral component, drilled the lugs, inserted a  6 mm trial bearing, trial patellar button, and took the knee through range of motion from 0-130 degrees. Medial and lateral ligamentous stability was checked. No thumb pressure was required for patellar Tracking. The tourniquet was @48  min. All trial components were removed, mating surfaces irrigated with pulse lavage, and dried with suction and sponges. 10 cc of the Exparel solution was applied to the cancellus bone of the patella distal femur and proximal tibia.  After waiting 30 seconds, the bony surfaces were again, dried with sponges. A double batch of DePuy HV cement was  mixed and applied to all bony metallic mating surfaces except for the posterior condyles of the femur itself. In order, we hammered into place the tibial tray and removed excess cement, the femoral component and removed excess cement. The final Attune RP bearing was inserted, and the knee brought to full extension with compression. The patellar button was clamped into place, and excess cement removed. The knee was held at 30 flexion with compression, while the cement cured. The wound was irrigated out with normal saline solution pulse lavage. The rest of the Exparel was injected into the parapatellar arthrotomy, subcutaneous tissues, and periosteal tissues. The parapatellar arthrotomy was closed with running #1 Vicryl suture. The subcutaneous tissue with 3-0 undyed Vicryl suture, and the skin with running 3-0 SQ vicryl. An Aquacil  and Ace wrap were applied. The patient was taken to recovery room without difficulty.   Alta Corning 12/19/2020, 9:08 AM

## 2020-12-19 NOTE — TOC Transition Note (Signed)
Transition of Care [facility]) - CM/SW Discharge Note   Patient Details  Name: [name] Browning MRN: [id number] Date of Birth: 1940/08/31  [facility]) CM/SW Contact:  Lennart Pall, LCSW Phone Number: 12/19/2020, 1:03 PM   Clinical Narrative:    Met with pt and spouse and confirming that DME via [employer] has already been delivered to the home.  HHPT prearranged via [place].  No further TOC needs.   Final next level of care: Home w Home Health Services Barriers to Discharge: No Barriers Identified   Patient Goals and CMS Choice Patient states their goals for this hospitalization and ongoing recovery are:: return home      Discharge Placement                       Discharge Plan and Services                DME Arranged: CPM, Walker rolling DME Agency: [employer] Date DME Agency Contacted:  (prearranged via MD office)     HH Arranged: PT [place] Agency: [place] Date [place]:  (prearranged via MD office)      Social Determinants of Health (SDOH) Interventions     Readmission Risk Interventions No flowsheet data found.

## 2020-12-19 NOTE — Anesthesia Procedure Notes (Signed)
Anesthesia Regional Block: Adductor canal block   Pre-Anesthetic Checklist: , timeout performed,  Correct Patient, Correct Site, Correct Laterality,  Correct Procedure, Correct Position, site marked,  Risks and benefits discussed,  Surgical consent,  Pre-op evaluation,  At surgeon's request and post-op pain management  Laterality: Left  Prep: chloraprep       Needles:  Injection technique: Single-shot  Needle Type: Echogenic Needle     Needle Length: 9cm      Additional Needles:   Procedures:,,,, ultrasound used (permanent image in chart),,    Narrative:  Start time: 12/19/2020 7:08 AM End time: 12/19/2020 7:14 AM Injection made incrementally with aspirations every 5 mL.  Performed by: Personally  Anesthesiologist: Myrtie Soman, MD  Additional Notes: Patient tolerated the procedure well without complications

## 2020-12-19 NOTE — Discharge Instructions (Signed)

## 2020-12-20 DIAGNOSIS — I10 Essential (primary) hypertension: Secondary | ICD-10-CM | POA: Diagnosis not present

## 2020-12-20 DIAGNOSIS — Z87891 Personal history of nicotine dependence: Secondary | ICD-10-CM | POA: Diagnosis not present

## 2020-12-20 DIAGNOSIS — M1712 Unilateral primary osteoarthritis, left knee: Secondary | ICD-10-CM | POA: Diagnosis not present

## 2020-12-20 DIAGNOSIS — J45909 Unspecified asthma, uncomplicated: Secondary | ICD-10-CM | POA: Diagnosis not present

## 2020-12-20 LAB — CBC
HCT: 32.2 % — ABNORMAL LOW (ref 39.0–52.0)
Hemoglobin: 11 g/dL — ABNORMAL LOW (ref 13.0–17.0)
MCH: 31.1 pg (ref 26.0–34.0)
MCHC: 34.2 g/dL (ref 30.0–36.0)
MCV: 91 fL (ref 80.0–100.0)
Platelets: 297 10*3/uL (ref 150–400)
RBC: 3.54 MIL/uL — ABNORMAL LOW (ref 4.22–5.81)
RDW: 12.5 % (ref 11.5–15.5)
WBC: 9.7 10*3/uL (ref 4.0–10.5)
nRBC: 0 % (ref 0.0–0.2)

## 2020-12-20 NOTE — Progress Notes (Signed)
Patient provided dc instructions and dc instructions explained to patient and patient's wife in detail. Patient verbalizes understanding; escorted to lobby in wheelchair and assisted to car with no difficulties.

## 2020-12-20 NOTE — Progress Notes (Signed)
Physical Therapy Treatment Patient Details Name: [name] Browning MRN: [id number] DOB: 11/16/1940 Today's Date: 12/20/2020   History of Present Illness Pt s/p L TKR and with hx of ADD and DJD    PT Comments    Pt motivated and progressing well with mobility.  Pt should progress to dc home later today.   Recommendations for follow up therapy are one component of a multi-disciplinary discharge planning process, led by the attending physician.  Recommendations may be updated based on patient status, additional functional criteria and insurance authorization.  Follow Up Recommendations  Follow physician's recommendations for discharge plan and follow up therapies     Assistance Recommended at Discharge Frequent or constant Supervision/Assistance  Equipment Recommendations  None recommended by PT    Recommendations for Other Services       Precautions / Restrictions Precautions Precautions: Knee;Fall Restrictions Weight Bearing Restrictions: No Other Position/Activity Restrictions: WBAT     Mobility  Bed Mobility Overal bed mobility: Needs Assistance Bed Mobility: Supine to Sit     Supine to sit: Min guard     General bed mobility comments: cues for sequence and use of R LE to self assist    Transfers Overall transfer level: Needs assistance Equipment used: Rolling walker (2 wheels) Transfers: Sit to/from Stand Sit to Stand: Min guard           General transfer comment: cues for LE management and use of UEs to self assist    Ambulation/Gait Ambulation/Gait assistance: Min guard Gait Distance (Feet): 150 Feet Assistive device: Rolling walker (2 wheels) Gait Pattern/deviations: Step-to pattern;Step-through pattern;Decreased step length - right;Decreased step length - left;Shuffle;Trunk flexed Gait velocity: decr   General Gait Details: cues for sequence, posture and position from RW.  Initial instability but progressive improvement with increased distance  ambulated   Stairs             Wheelchair Mobility    Modified Rankin (Stroke Patients Only)       Balance Overall balance assessment: Needs assistance Sitting-balance support: No upper extremity supported;Feet supported Sitting balance-Leahy Scale: Good     Standing balance support: Bilateral upper extremity supported Standing balance-Leahy Scale: Poor                              Cognition Arousal/Alertness: Awake/alert Behavior During Therapy: WFL for tasks assessed/performed Overall Cognitive Status: Within Functional Limits for tasks assessed                                          Exercises Total Joint Exercises Ankle Circles/Pumps: AROM;Both;15 reps;Supine Quad Sets: AROM;Both;10 reps;Supine Heel Slides: AAROM;Left;Supine;10 reps Hip ABduction/ADduction: AAROM;Left;10 reps;Supine Straight Leg Raises: AAROM;Left;10 reps;Supine    General Comments        Pertinent Vitals/Pain Pain Assessment: 0-10 Pain Score: 4  Pain Location: L knee Pain Descriptors / Indicators: Aching;Sore Pain Intervention(s): Limited activity within patient's tolerance;Monitored during session;Premedicated before session;Ice applied    Home Living                          Prior Function            PT Goals (current goals can now be found in the care plan section) Acute Rehab PT Goals Patient Stated Goal: Regain IND and be golfing by spring PT  Goal Formulation: With patient Time For Goal Achievement: 12/26/20 Potential to Achieve Goals: Good Progress towards PT goals: Progressing toward goals    Frequency    7X/week      PT Plan Current plan remains appropriate    Co-evaluation              AM-PAC PT "6 Clicks" Mobility   Outcome Measure  Help needed turning from your back to your side while in a flat bed without using bedrails?: A Little Help needed moving from lying on your back to sitting on the side of a  flat bed without using bedrails?: A Little Help needed moving to and from a bed to a chair (including a wheelchair)?: A Little Help needed standing up from a chair using your arms (e.g., wheelchair or bedside chair)?: A Little Help needed to walk in hospital room?: A Little Help needed climbing 3-5 steps with a railing? : A Little 6 Click Score: 18    End of Session Equipment Utilized During Treatment: Gait belt Activity Tolerance: Patient tolerated treatment well Patient left: in chair;with call bell/phone within reach;with chair alarm set;with family/visitor present Nurse Communication: Mobility status PT Visit Diagnosis: Difficulty in walking, not elsewhere classified (R26.2)     Time: 1610-9604 PT Time Calculation (min) (ACUTE ONLY): 34 min  Charges:  $Gait Training: 8-22 mins $Therapeutic Exercise: 8-22 mins                     Mauro Kaufmann PT Acute Rehabilitation Services Pager [phone] Office [phone]    Sahil Milner 12/20/2020, 8:59 AM

## 2020-12-20 NOTE — Progress Notes (Signed)
    Patient doing well PO day 1 S/P L TKR by Dr Berenice Primas team. He was up with therapy yesterday and again this morning and is doing excellent.  He has been ambulating the hallways.  He had a little bit of difficulty with transitions and therapy does want to come back later this morning to clear him before going home today.  He states his pain is very well controlled.  His wife has already picked up his prescriptions.  They are comfortable proceeding home today after clearance with therapy.   Physical Exam: Vitals:   12/20/20 0048 12/20/20 0531  BP: (!) 162/85 (!) 148/79  Pulse: 79 83  Resp: 15 15  Temp:  97.6 F (36.4 C)  SpO2: 98% 98%    Dressing in place, clean dry and intact.  Distal compartments are soft.  Patient appears very comfortable ambulating and sitting in the chair.  CPM is at bedside NVI  POD #1 s/p left total knee replacement by Dr. Berenice Primas team, doing very well  - up with PT/OT, encourage ambulation.  PT to return 1 more time before proceeding home today - Percocet for pain, Robaxin for muscle spasms - ASA for anticoagulation, prilosec for acid reflux - likely d/c home today with f/u in 2 weeks

## 2020-12-20 NOTE — Plan of Care (Signed)
  Problem: Nutrition: Goal: Adequate nutrition will be maintained Outcome: Progressing   Problem: Pain Managment: Goal: General experience of comfort will improve Outcome: Progressing   

## 2020-12-20 NOTE — Progress Notes (Signed)
Physical Therapy Treatment Patient Details Name: [name] Browning MRN: [id number] DOB: Jul 21, 1940 Today's Date: 12/20/2020   History of Present Illness Pt s/p L TKR and with hx of ADD and DJD    PT Comments    Pt continues motivated and up to ambulate in hall with noted improvement in stability.  Pt reviewed written HEP and pt assisted with lower body dressing with assist of spouse.  Pt and spouse with many questions asked and answered and both eager for dc home this date.     Recommendations for follow up therapy are one component of a multi-disciplinary discharge planning process, led by the attending physician.  Recommendations may be updated based on patient status, additional functional criteria and insurance authorization.  Follow Up Recommendations  Follow physician's recommendations for discharge plan and follow up therapies     Assistance Recommended at Discharge Frequent or constant Supervision/Assistance  Equipment Recommendations  None recommended by PT    Recommendations for Other Services       Precautions / Restrictions Precautions Precautions: Knee;Fall Restrictions Weight Bearing Restrictions: No Other Position/Activity Restrictions: WBAT     Mobility  Bed Mobility               General bed mobility comments: Pt up in chair and requests back to same    Transfers Overall transfer level: Needs assistance Equipment used: Rolling walker (2 wheels) Transfers: Sit to/from Stand Sit to Stand: Min guard;Supervision           General transfer comment: cues for LE management and use of UEs to self assist; repeated sit<>stand x 3    Ambulation/Gait Ambulation/Gait assistance: Min guard;Supervision Gait Distance (Feet): 140 Feet Assistive device: Rolling walker (2 wheels) Gait Pattern/deviations: Step-to pattern;Step-through pattern;Decreased step length - right;Decreased step length - left;Shuffle;Trunk flexed Gait velocity: decr   General Gait  Details: cues for sequence, posture and position from RW.  Initial instability but progressive improvement with increased distance ambulated   Stairs             Wheelchair Mobility    Modified Rankin (Stroke Patients Only)       Balance Overall balance assessment: Needs assistance Sitting-balance support: No upper extremity supported;Feet supported Sitting balance-Leahy Scale: Good     Standing balance support: Bilateral upper extremity supported Standing balance-Leahy Scale: Poor                              Cognition Arousal/Alertness: Awake/alert Behavior During Therapy: WFL for tasks assessed/performed Overall Cognitive Status: Within Functional Limits for tasks assessed                                          Exercises      General Comments        Pertinent Vitals/Pain Pain Assessment: 0-10 Pain Score: 4  Pain Location: L knee Pain Descriptors / Indicators: Aching;Sore Pain Intervention(s): Limited activity within patient's tolerance;Monitored during session;Premedicated before session;Ice applied    Home Living                          Prior Function            PT Goals (current goals can now be found in the care plan section) Acute Rehab PT Goals Patient Stated Goal: Regain IND and  be golfing by spring PT Goal Formulation: With patient Time For Goal Achievement: 12/26/20 Potential to Achieve Goals: Good Progress towards PT goals: Progressing toward goals    Frequency    7X/week      PT Plan Current plan remains appropriate    Co-evaluation              AM-PAC PT "6 Clicks" Mobility   Outcome Measure  Help needed turning from your back to your side while in a flat bed without using bedrails?: A Little Help needed moving from lying on your back to sitting on the side of a flat bed without using bedrails?: A Little Help needed moving to and from a bed to a chair (including a  wheelchair)?: A Little Help needed standing up from a chair using your arms (e.g., wheelchair or bedside chair)?: A Little Help needed to walk in hospital room?: A Little Help needed climbing 3-5 steps with a railing? : A Little 6 Click Score: 18    End of Session Equipment Utilized During Treatment: Gait belt Activity Tolerance: Patient tolerated treatment well Patient left: in chair;with call bell/phone within reach;with chair alarm set;with family/visitor present Nurse Communication: Mobility status PT Visit Diagnosis: Difficulty in walking, not elsewhere classified (R26.2)     Time: 1032-1100 PT Time Calculation (min) (ACUTE ONLY): 28 min  Charges:  $Gait Training: 8-22 mins $Therapeutic Activity: 8-22 mins                     Mauro Kaufmann PT Acute Rehabilitation Services Pager [phone] Office [phone]    Laydon Martis 12/20/2020, 1:02 PM

## 2020-12-21 DIAGNOSIS — E291 Testicular hypofunction: Secondary | ICD-10-CM | POA: Diagnosis not present

## 2020-12-21 DIAGNOSIS — F988 Other specified behavioral and emotional disorders with onset usually occurring in childhood and adolescence: Secondary | ICD-10-CM | POA: Diagnosis not present

## 2020-12-21 DIAGNOSIS — K219 Gastro-esophageal reflux disease without esophagitis: Secondary | ICD-10-CM | POA: Diagnosis not present

## 2020-12-21 DIAGNOSIS — H269 Unspecified cataract: Secondary | ICD-10-CM | POA: Diagnosis not present

## 2020-12-21 DIAGNOSIS — K5900 Constipation, unspecified: Secondary | ICD-10-CM | POA: Diagnosis not present

## 2020-12-21 DIAGNOSIS — Z87891 Personal history of nicotine dependence: Secondary | ICD-10-CM | POA: Diagnosis not present

## 2020-12-21 DIAGNOSIS — Z471 Aftercare following joint replacement surgery: Secondary | ICD-10-CM | POA: Diagnosis not present

## 2020-12-21 DIAGNOSIS — I499 Cardiac arrhythmia, unspecified: Secondary | ICD-10-CM | POA: Diagnosis not present

## 2020-12-21 DIAGNOSIS — I10 Essential (primary) hypertension: Secondary | ICD-10-CM | POA: Diagnosis not present

## 2020-12-21 DIAGNOSIS — I4892 Unspecified atrial flutter: Secondary | ICD-10-CM | POA: Diagnosis not present

## 2020-12-21 DIAGNOSIS — Z96652 Presence of left artificial knee joint: Secondary | ICD-10-CM | POA: Diagnosis not present

## 2020-12-21 DIAGNOSIS — I700 Atherosclerosis of aorta: Secondary | ICD-10-CM | POA: Diagnosis not present

## 2020-12-21 DIAGNOSIS — G4730 Sleep apnea, unspecified: Secondary | ICD-10-CM | POA: Diagnosis not present

## 2020-12-21 DIAGNOSIS — Z7982 Long term (current) use of aspirin: Secondary | ICD-10-CM | POA: Diagnosis not present

## 2020-12-21 DIAGNOSIS — F32A Depression, unspecified: Secondary | ICD-10-CM | POA: Diagnosis not present

## 2020-12-21 DIAGNOSIS — N320 Bladder-neck obstruction: Secondary | ICD-10-CM | POA: Diagnosis not present

## 2020-12-21 DIAGNOSIS — J45909 Unspecified asthma, uncomplicated: Secondary | ICD-10-CM | POA: Diagnosis not present

## 2020-12-21 DIAGNOSIS — F419 Anxiety disorder, unspecified: Secondary | ICD-10-CM | POA: Diagnosis not present

## 2020-12-21 DIAGNOSIS — N3941 Urge incontinence: Secondary | ICD-10-CM | POA: Diagnosis not present

## 2020-12-21 DIAGNOSIS — E785 Hyperlipidemia, unspecified: Secondary | ICD-10-CM | POA: Diagnosis not present

## 2020-12-21 DIAGNOSIS — Z86010 Personal history of colonic polyps: Secondary | ICD-10-CM | POA: Diagnosis not present

## 2020-12-22 DIAGNOSIS — J45909 Unspecified asthma, uncomplicated: Secondary | ICD-10-CM | POA: Diagnosis not present

## 2020-12-22 DIAGNOSIS — Z471 Aftercare following joint replacement surgery: Secondary | ICD-10-CM | POA: Diagnosis not present

## 2020-12-22 DIAGNOSIS — G4730 Sleep apnea, unspecified: Secondary | ICD-10-CM | POA: Diagnosis not present

## 2020-12-22 DIAGNOSIS — E785 Hyperlipidemia, unspecified: Secondary | ICD-10-CM | POA: Diagnosis not present

## 2020-12-22 DIAGNOSIS — I4892 Unspecified atrial flutter: Secondary | ICD-10-CM | POA: Diagnosis not present

## 2020-12-22 DIAGNOSIS — E291 Testicular hypofunction: Secondary | ICD-10-CM | POA: Diagnosis not present

## 2020-12-23 ENCOUNTER — Encounter (HOSPITAL_COMMUNITY): Payer: Self-pay | Admitting: Orthopedic Surgery

## 2020-12-24 DIAGNOSIS — Z471 Aftercare following joint replacement surgery: Secondary | ICD-10-CM | POA: Diagnosis not present

## 2020-12-24 DIAGNOSIS — J45909 Unspecified asthma, uncomplicated: Secondary | ICD-10-CM | POA: Diagnosis not present

## 2020-12-24 DIAGNOSIS — E291 Testicular hypofunction: Secondary | ICD-10-CM | POA: Diagnosis not present

## 2020-12-24 DIAGNOSIS — E785 Hyperlipidemia, unspecified: Secondary | ICD-10-CM | POA: Diagnosis not present

## 2020-12-24 DIAGNOSIS — G4730 Sleep apnea, unspecified: Secondary | ICD-10-CM | POA: Diagnosis not present

## 2020-12-24 DIAGNOSIS — I4892 Unspecified atrial flutter: Secondary | ICD-10-CM | POA: Diagnosis not present

## 2020-12-24 NOTE — Discharge Summary (Signed)
Patient ID: [name] Browning MRN: [id number] DOB/AGE: 1940-03-30 80 y.o.  Admit date: 12/19/2020 Discharge date: 12/20/2020  Admission Diagnoses:  Principal Problem:   Primary osteoarthritis of left knee   Discharge Diagnoses:  Same  Past Medical History:  Diagnosis Date   Acute bronchitis 08/11/2016   6/18  H/o asthma as a child...   ADD (attention deficit disorder)    Anemia in chronic illness 06/15/2013   Followed as Primary Care Patient/ [facility]/ [facility] - Fe studies ok 06/15/13 > stool cards received 06/27/2013 > neg x 5> down to 11.4 08/23/13  > referred for hematology 09/04/2013> Alvy Bimler felt it was due to low Testosterone  Resolved as of 08/14/2015         Anxiety    Asthma    AS A CHILD   Ataxia 06/07/2016   2018 poss age related/meds   Atrial flutter ([place])    a. s/p RFCA 07/2011.  Xarelto d/c 08/2011.   Bladder neck obstruction 10/04/2016   Dr Karsten Ro Rapaflo   Cataracts, bilateral    Colon polyps 10/04/2016   Dr Ardis Hughs Due colon 02/2019   Conjunctivitis 09/14/2013   Cough 02/19/2014   DgEs   01/23/16 Unremarkable esophagram. Allergy profile 04/05/2016 >  Eos 0.4 /  IgE  284  RAST pos grass/trees/dust > rec allergy eval    Depression    Diarrhea 02/11/2017   Disturbance in sleep behavior 04/30/2010    Trazodone  Lorazepam prn   DIZZINESS 01/24/2008   Qualifier: Diagnosis of  By: Royal Piedra NP, Tammy     DJD (degenerative joint disease) of knee    Drug-induced memory loss ([place]) 02/22/2014   D/c statin 01/2014 > improved  D/c Trintellix Dr Tish Frederickson   Dyspnea 06/18/2011   Dysrhythmia    hx a-flutter with successful ablation 2013   Essential hypertension 06/18/2011   NAS diet ASA - unable to use due to h/o AVM bleeding - colon 2003   FATIGUE, ACUTE 03/23/2007   Qualifier: Diagnosis of  By: Melvyn Novas MD, Legrand Como B    GERD (gastroesophageal reflux disease)    Heart murmur    a. 06/2011 Echo: 65-70%, mild MR   History of lower GI bleeding 09/26/2012   Followed in GI clinic/ [facility]/ [place]  - colonoscopy 05/22/2001  And 06/14/11 > c/w hemorrhoids and cecal avm    Hyperlipidemia    Hyperlipidemia LDL goal <130 03/22/2007   Followed as Primary Care Patient/ [facility]/ [facility]   Zetia    S/P cardiac cath    a. in 1990's - Dr. Gwenlyn Found - WNL   Scalp laceration 11/20/2014   11/01/14 > staples removed 11/18/2014     Sleep apnea    could not tolerate cpap, uses mouth guard now   TESTICULAR HYPOFUNCTION 09/15/2009   F/u urology on testosterone gel      Surgeries: Procedure(s): TOTAL KNEE ARTHROPLASTY on 12/19/2020   Consultants: Nobe  Discharged Condition: Improved  Hospital Course: [name] Browning is an 80 y.o. male who was admitted 12/19/2020 for operative treatment of Primary osteoarthritis of left knee. Patient has severe unremitting pain that affects sleep, daily activities, and work/hobbies. After pre-op clearance the patient was taken to the operating room on 12/19/2020 and underwent  Procedure(s): TOTAL KNEE ARTHROPLASTY.    Patient was given perioperative antibiotics:  Anti-infectives (From admission, onward)    Start     Dose/Rate Route Frequency Ordered Stop   12/19/20 1400  ceFAZolin (ANCEF) IVPB 2g/100 mL premix  2 g 200 mL/hr over 30 Minutes Intravenous Every 6 hours 12/19/20 1115 12/19/20 2111   12/19/20 0600  ceFAZolin (ANCEF) IVPB 2g/100 mL premix        2 g 200 mL/hr over 30 Minutes Intravenous On call to O.R. 12/19/20 3810 12/19/20 0747        Patient was given sequential compression devices, early ambulation to prevent DVT.  Patient benefited maximally from hospital stay and there were no complications.    Recent vital signs: BP (!) 150/73 (BP Location: Left Arm)   Pulse 82   Temp 98.6 F (37 C) (Oral)   Resp 18   Ht 5\' 6"  (1.676 m)   Wt 66.2 kg   SpO2 97%   BMI 23.56 kg/m    Discharge Medications:   Allergies as of 12/20/2020       Reactions   Crestor [rosuvastatin]    Memory issues   Trintellix [vortioxetine]     Memory loss   Zocor [simvastatin]    Memory issues        Medication List     TAKE these medications    aspirin EC 325 MG tablet Take 1 tablet (325 mg total) by mouth 2 (two) times daily after a meal. Take x 1 month post op to decrease risk of blood clots.   aspirin EC 325 MG tablet Take 1 tablet (325 mg total) by mouth 2 (two) times daily after a meal. Take x 1 month post op to decrease risk of blood clots.   aspirin EC 81 MG tablet Take 1 tablet (81 mg total) by mouth daily. Swallow whole.   celecoxib 200 MG capsule Commonly known as: CeleBREX Take 1 capsule (200 mg total) by mouth daily.   cyanocobalamin 1000 MCG tablet Take 1,000 mcg by mouth daily.   docusate sodium 100 MG capsule Commonly known as: Colace Take 1 capsule (100 mg total) by mouth 2 (two) times daily.   docusate sodium 100 MG capsule Commonly known as: Colace Take 1 capsule (100 mg total) by mouth 2 (two) times daily.   ezetimibe 10 MG tablet Commonly known as: ZETIA TAKE 1 TABLET BY MOUTH  EVERY DAY   Multiple Vitamin tablet Take 1 tablet by mouth daily.   omeprazole 20 MG tablet Commonly known as: PriLOSEC OTC Take 1 tablet (20 mg total) by mouth daily.   oxyCODONE-acetaminophen 5-325 MG tablet Commonly known as: PERCOCET/ROXICET Take 1-2 tablets by mouth every 6 (six) hours as needed for severe pain.   oxyCODONE-acetaminophen 5-325 MG tablet Commonly known as: PERCOCET/ROXICET Take 1-2 tablets by mouth every 6 (six) hours as needed for severe pain.   Rapaflo 8 MG Caps capsule Generic drug: silodosin Take 8 mg by mouth daily with breakfast.   Repatha SureClick 175 MG/ML Soaj Generic drug: Evolocumab INJECT 1 PEN INTO THE SKIN EVERY 14 (FOURTEEN) DAYS.   ST JOHNS WORT PO Take 1 tablet by mouth daily.   tiZANidine 2 MG tablet Commonly known as: ZANAFLEX Take 1 tablet (2 mg total) by mouth every 8 (eight) hours as needed for muscle spasms.   tiZANidine 2 MG tablet Commonly  known as: ZANAFLEX Take 1 tablet (2 mg total) by mouth every 8 (eight) hours as needed for muscle spasms.   Vitamin D3 50 MCG (2000 UT) capsule Take 1 capsule (2,000 Units total) by mouth daily.   ZINC PO Take 1 tablet by mouth daily.        Diagnostic Studies: DG Chest 2 View  Result  Date: 12/12/2020 CLINICAL DATA:  Pre-op chest exam, quit smoking at age 77 EXAM: CHEST - 2 VIEW COMPARISON:  04/05/2016 FINDINGS: Lungs are clear. Heart size and mediastinal contours are within normal limits. Aortic Atherosclerosis (ICD10-170.0). No effusion. Anterior vertebral endplate spurring at multiple levels in the mid and lower thoracic spine. IMPRESSION: No acute cardiopulmonary disease. Electronically Signed   By: Lucrezia Europe M.D.   On: 12/12/2020 08:12    Disposition: Discharge disposition: 01-Home or Self Care       Discharge Instructions     Discharge patient   Complete by: As directed    All meds already sent to pharmacy and picked up by wife   Discharge disposition: 01-Home or Self Care   Discharge patient date: 12/20/2020      POD #1 s/p left total knee replacement by Dr. Berenice Primas team, doing very well   - up with PT/OT, encourage ambulation.  PT to return 1 more time before proceeding home today - Percocet for pain, Robaxin for muscle spasms - ASA for anticoagulation, prilosec for acid reflux - likely d/c home today with f/u in 2 weeks   Signed: Justice Britain 12/24/2020, 12:17 PM

## 2020-12-26 DIAGNOSIS — I4892 Unspecified atrial flutter: Secondary | ICD-10-CM | POA: Diagnosis not present

## 2020-12-26 DIAGNOSIS — J45909 Unspecified asthma, uncomplicated: Secondary | ICD-10-CM | POA: Diagnosis not present

## 2020-12-26 DIAGNOSIS — E291 Testicular hypofunction: Secondary | ICD-10-CM | POA: Diagnosis not present

## 2020-12-26 DIAGNOSIS — G4730 Sleep apnea, unspecified: Secondary | ICD-10-CM | POA: Diagnosis not present

## 2020-12-26 DIAGNOSIS — E785 Hyperlipidemia, unspecified: Secondary | ICD-10-CM | POA: Diagnosis not present

## 2020-12-26 DIAGNOSIS — Z471 Aftercare following joint replacement surgery: Secondary | ICD-10-CM | POA: Diagnosis not present

## 2020-12-29 DIAGNOSIS — Z471 Aftercare following joint replacement surgery: Secondary | ICD-10-CM | POA: Diagnosis not present

## 2020-12-29 DIAGNOSIS — E291 Testicular hypofunction: Secondary | ICD-10-CM | POA: Diagnosis not present

## 2020-12-29 DIAGNOSIS — E785 Hyperlipidemia, unspecified: Secondary | ICD-10-CM | POA: Diagnosis not present

## 2020-12-29 DIAGNOSIS — J45909 Unspecified asthma, uncomplicated: Secondary | ICD-10-CM | POA: Diagnosis not present

## 2020-12-29 DIAGNOSIS — I4892 Unspecified atrial flutter: Secondary | ICD-10-CM | POA: Diagnosis not present

## 2020-12-29 DIAGNOSIS — G4730 Sleep apnea, unspecified: Secondary | ICD-10-CM | POA: Diagnosis not present

## 2020-12-31 DIAGNOSIS — J45909 Unspecified asthma, uncomplicated: Secondary | ICD-10-CM | POA: Diagnosis not present

## 2020-12-31 DIAGNOSIS — I4892 Unspecified atrial flutter: Secondary | ICD-10-CM | POA: Diagnosis not present

## 2020-12-31 DIAGNOSIS — G4730 Sleep apnea, unspecified: Secondary | ICD-10-CM | POA: Diagnosis not present

## 2020-12-31 DIAGNOSIS — Z471 Aftercare following joint replacement surgery: Secondary | ICD-10-CM | POA: Diagnosis not present

## 2020-12-31 DIAGNOSIS — E785 Hyperlipidemia, unspecified: Secondary | ICD-10-CM | POA: Diagnosis not present

## 2020-12-31 DIAGNOSIS — E291 Testicular hypofunction: Secondary | ICD-10-CM | POA: Diagnosis not present

## 2021-01-01 DIAGNOSIS — M25562 Pain in left knee: Secondary | ICD-10-CM | POA: Diagnosis not present

## 2021-01-01 DIAGNOSIS — Z96652 Presence of left artificial knee joint: Secondary | ICD-10-CM | POA: Diagnosis not present

## 2021-01-01 DIAGNOSIS — M25662 Stiffness of left knee, not elsewhere classified: Secondary | ICD-10-CM | POA: Diagnosis not present

## 2021-01-01 DIAGNOSIS — M6281 Muscle weakness (generalized): Secondary | ICD-10-CM | POA: Diagnosis not present

## 2021-01-05 NOTE — Progress Notes (Signed)
The patient underwent total joint replacement.  The patient was set to have spinal anesthesia.  Given his age and comorbidities it was felt that PT and PTT were necessary prior to doing a spinal to ensure safety of the procedure.

## 2021-01-06 DIAGNOSIS — M6281 Muscle weakness (generalized): Secondary | ICD-10-CM | POA: Diagnosis not present

## 2021-01-06 DIAGNOSIS — M25662 Stiffness of left knee, not elsewhere classified: Secondary | ICD-10-CM | POA: Diagnosis not present

## 2021-01-06 DIAGNOSIS — Z96652 Presence of left artificial knee joint: Secondary | ICD-10-CM | POA: Diagnosis not present

## 2021-01-12 DIAGNOSIS — Z96652 Presence of left artificial knee joint: Secondary | ICD-10-CM | POA: Diagnosis not present

## 2021-01-12 DIAGNOSIS — M25662 Stiffness of left knee, not elsewhere classified: Secondary | ICD-10-CM | POA: Diagnosis not present

## 2021-01-12 DIAGNOSIS — M6281 Muscle weakness (generalized): Secondary | ICD-10-CM | POA: Diagnosis not present

## 2021-01-14 DIAGNOSIS — Z96652 Presence of left artificial knee joint: Secondary | ICD-10-CM | POA: Diagnosis not present

## 2021-01-14 DIAGNOSIS — M6281 Muscle weakness (generalized): Secondary | ICD-10-CM | POA: Diagnosis not present

## 2021-01-14 DIAGNOSIS — M25662 Stiffness of left knee, not elsewhere classified: Secondary | ICD-10-CM | POA: Diagnosis not present

## 2021-01-16 DIAGNOSIS — M25662 Stiffness of left knee, not elsewhere classified: Secondary | ICD-10-CM | POA: Diagnosis not present

## 2021-01-16 DIAGNOSIS — Z96652 Presence of left artificial knee joint: Secondary | ICD-10-CM | POA: Diagnosis not present

## 2021-01-16 DIAGNOSIS — M6281 Muscle weakness (generalized): Secondary | ICD-10-CM | POA: Diagnosis not present

## 2021-01-19 DIAGNOSIS — M25662 Stiffness of left knee, not elsewhere classified: Secondary | ICD-10-CM | POA: Diagnosis not present

## 2021-01-19 DIAGNOSIS — M6281 Muscle weakness (generalized): Secondary | ICD-10-CM | POA: Diagnosis not present

## 2021-01-19 DIAGNOSIS — Z96652 Presence of left artificial knee joint: Secondary | ICD-10-CM | POA: Diagnosis not present

## 2021-01-21 DIAGNOSIS — M6281 Muscle weakness (generalized): Secondary | ICD-10-CM | POA: Diagnosis not present

## 2021-01-21 DIAGNOSIS — Z96652 Presence of left artificial knee joint: Secondary | ICD-10-CM | POA: Diagnosis not present

## 2021-01-21 DIAGNOSIS — M25662 Stiffness of left knee, not elsewhere classified: Secondary | ICD-10-CM | POA: Diagnosis not present

## 2021-01-22 DIAGNOSIS — M25462 Effusion, left knee: Secondary | ICD-10-CM | POA: Diagnosis not present

## 2021-01-23 DIAGNOSIS — M6281 Muscle weakness (generalized): Secondary | ICD-10-CM | POA: Diagnosis not present

## 2021-01-23 DIAGNOSIS — M25662 Stiffness of left knee, not elsewhere classified: Secondary | ICD-10-CM | POA: Diagnosis not present

## 2021-01-23 DIAGNOSIS — Z96652 Presence of left artificial knee joint: Secondary | ICD-10-CM | POA: Diagnosis not present

## 2021-01-26 DIAGNOSIS — Z96652 Presence of left artificial knee joint: Secondary | ICD-10-CM | POA: Diagnosis not present

## 2021-01-26 DIAGNOSIS — M6281 Muscle weakness (generalized): Secondary | ICD-10-CM | POA: Diagnosis not present

## 2021-01-26 DIAGNOSIS — M25662 Stiffness of left knee, not elsewhere classified: Secondary | ICD-10-CM | POA: Diagnosis not present

## 2021-01-28 DIAGNOSIS — M25662 Stiffness of left knee, not elsewhere classified: Secondary | ICD-10-CM | POA: Diagnosis not present

## 2021-01-28 DIAGNOSIS — Z96652 Presence of left artificial knee joint: Secondary | ICD-10-CM | POA: Diagnosis not present

## 2021-01-28 DIAGNOSIS — M6281 Muscle weakness (generalized): Secondary | ICD-10-CM | POA: Diagnosis not present

## 2021-01-30 DIAGNOSIS — M6281 Muscle weakness (generalized): Secondary | ICD-10-CM | POA: Diagnosis not present

## 2021-01-30 DIAGNOSIS — Z96652 Presence of left artificial knee joint: Secondary | ICD-10-CM | POA: Diagnosis not present

## 2021-01-30 DIAGNOSIS — M25662 Stiffness of left knee, not elsewhere classified: Secondary | ICD-10-CM | POA: Diagnosis not present

## 2021-02-02 DIAGNOSIS — Z96652 Presence of left artificial knee joint: Secondary | ICD-10-CM | POA: Diagnosis not present

## 2021-02-02 DIAGNOSIS — M6281 Muscle weakness (generalized): Secondary | ICD-10-CM | POA: Diagnosis not present

## 2021-02-02 DIAGNOSIS — M25662 Stiffness of left knee, not elsewhere classified: Secondary | ICD-10-CM | POA: Diagnosis not present

## 2021-02-04 DIAGNOSIS — Z96652 Presence of left artificial knee joint: Secondary | ICD-10-CM | POA: Diagnosis not present

## 2021-02-04 DIAGNOSIS — M6281 Muscle weakness (generalized): Secondary | ICD-10-CM | POA: Diagnosis not present

## 2021-02-04 DIAGNOSIS — M25662 Stiffness of left knee, not elsewhere classified: Secondary | ICD-10-CM | POA: Diagnosis not present

## 2021-02-06 DIAGNOSIS — M25662 Stiffness of left knee, not elsewhere classified: Secondary | ICD-10-CM | POA: Diagnosis not present

## 2021-02-06 DIAGNOSIS — M6281 Muscle weakness (generalized): Secondary | ICD-10-CM | POA: Diagnosis not present

## 2021-02-06 DIAGNOSIS — Z96652 Presence of left artificial knee joint: Secondary | ICD-10-CM | POA: Diagnosis not present

## 2021-02-10 DIAGNOSIS — M25662 Stiffness of left knee, not elsewhere classified: Secondary | ICD-10-CM | POA: Diagnosis not present

## 2021-02-10 DIAGNOSIS — M6281 Muscle weakness (generalized): Secondary | ICD-10-CM | POA: Diagnosis not present

## 2021-02-10 DIAGNOSIS — Z96652 Presence of left artificial knee joint: Secondary | ICD-10-CM | POA: Diagnosis not present

## 2021-02-11 DIAGNOSIS — Z96652 Presence of left artificial knee joint: Secondary | ICD-10-CM | POA: Diagnosis not present

## 2021-02-11 DIAGNOSIS — M6281 Muscle weakness (generalized): Secondary | ICD-10-CM | POA: Diagnosis not present

## 2021-02-11 DIAGNOSIS — M25662 Stiffness of left knee, not elsewhere classified: Secondary | ICD-10-CM | POA: Diagnosis not present

## 2021-02-13 DIAGNOSIS — M6281 Muscle weakness (generalized): Secondary | ICD-10-CM | POA: Diagnosis not present

## 2021-02-13 DIAGNOSIS — Z96652 Presence of left artificial knee joint: Secondary | ICD-10-CM | POA: Diagnosis not present

## 2021-02-13 DIAGNOSIS — M25662 Stiffness of left knee, not elsewhere classified: Secondary | ICD-10-CM | POA: Diagnosis not present

## 2021-02-17 DIAGNOSIS — M6281 Muscle weakness (generalized): Secondary | ICD-10-CM | POA: Diagnosis not present

## 2021-02-17 DIAGNOSIS — M25662 Stiffness of left knee, not elsewhere classified: Secondary | ICD-10-CM | POA: Diagnosis not present

## 2021-02-17 DIAGNOSIS — Z96652 Presence of left artificial knee joint: Secondary | ICD-10-CM | POA: Diagnosis not present

## 2021-02-18 DIAGNOSIS — M6281 Muscle weakness (generalized): Secondary | ICD-10-CM | POA: Diagnosis not present

## 2021-02-18 DIAGNOSIS — Z96652 Presence of left artificial knee joint: Secondary | ICD-10-CM | POA: Diagnosis not present

## 2021-02-18 DIAGNOSIS — M25662 Stiffness of left knee, not elsewhere classified: Secondary | ICD-10-CM | POA: Diagnosis not present

## 2021-02-20 DIAGNOSIS — Z96652 Presence of left artificial knee joint: Secondary | ICD-10-CM | POA: Diagnosis not present

## 2021-02-20 DIAGNOSIS — M6281 Muscle weakness (generalized): Secondary | ICD-10-CM | POA: Diagnosis not present

## 2021-02-20 DIAGNOSIS — M25662 Stiffness of left knee, not elsewhere classified: Secondary | ICD-10-CM | POA: Diagnosis not present

## 2021-02-23 DIAGNOSIS — M25662 Stiffness of left knee, not elsewhere classified: Secondary | ICD-10-CM | POA: Diagnosis not present

## 2021-02-23 DIAGNOSIS — Z96652 Presence of left artificial knee joint: Secondary | ICD-10-CM | POA: Diagnosis not present

## 2021-02-23 DIAGNOSIS — M6281 Muscle weakness (generalized): Secondary | ICD-10-CM | POA: Diagnosis not present

## 2021-02-25 DIAGNOSIS — M6281 Muscle weakness (generalized): Secondary | ICD-10-CM | POA: Diagnosis not present

## 2021-02-25 DIAGNOSIS — M25662 Stiffness of left knee, not elsewhere classified: Secondary | ICD-10-CM | POA: Diagnosis not present

## 2021-02-25 DIAGNOSIS — Z96652 Presence of left artificial knee joint: Secondary | ICD-10-CM | POA: Diagnosis not present

## 2021-02-27 DIAGNOSIS — M25662 Stiffness of left knee, not elsewhere classified: Secondary | ICD-10-CM | POA: Diagnosis not present

## 2021-02-27 DIAGNOSIS — M6281 Muscle weakness (generalized): Secondary | ICD-10-CM | POA: Diagnosis not present

## 2021-02-27 DIAGNOSIS — Z96652 Presence of left artificial knee joint: Secondary | ICD-10-CM | POA: Diagnosis not present

## 2021-03-02 DIAGNOSIS — M6281 Muscle weakness (generalized): Secondary | ICD-10-CM | POA: Diagnosis not present

## 2021-03-02 DIAGNOSIS — Z96652 Presence of left artificial knee joint: Secondary | ICD-10-CM | POA: Diagnosis not present

## 2021-03-02 DIAGNOSIS — M25662 Stiffness of left knee, not elsewhere classified: Secondary | ICD-10-CM | POA: Diagnosis not present

## 2021-03-04 DIAGNOSIS — Z96652 Presence of left artificial knee joint: Secondary | ICD-10-CM | POA: Diagnosis not present

## 2021-03-04 DIAGNOSIS — M25662 Stiffness of left knee, not elsewhere classified: Secondary | ICD-10-CM | POA: Diagnosis not present

## 2021-03-04 DIAGNOSIS — M6281 Muscle weakness (generalized): Secondary | ICD-10-CM | POA: Diagnosis not present

## 2021-03-06 DIAGNOSIS — M25662 Stiffness of left knee, not elsewhere classified: Secondary | ICD-10-CM | POA: Diagnosis not present

## 2021-03-06 DIAGNOSIS — M79675 Pain in left toe(s): Secondary | ICD-10-CM | POA: Diagnosis not present

## 2021-03-06 DIAGNOSIS — Z96652 Presence of left artificial knee joint: Secondary | ICD-10-CM | POA: Diagnosis not present

## 2021-03-06 DIAGNOSIS — M6281 Muscle weakness (generalized): Secondary | ICD-10-CM | POA: Diagnosis not present

## 2021-03-09 DIAGNOSIS — M25662 Stiffness of left knee, not elsewhere classified: Secondary | ICD-10-CM | POA: Diagnosis not present

## 2021-03-09 DIAGNOSIS — Z96652 Presence of left artificial knee joint: Secondary | ICD-10-CM | POA: Diagnosis not present

## 2021-03-09 DIAGNOSIS — M6281 Muscle weakness (generalized): Secondary | ICD-10-CM | POA: Diagnosis not present

## 2021-03-11 DIAGNOSIS — M25662 Stiffness of left knee, not elsewhere classified: Secondary | ICD-10-CM | POA: Diagnosis not present

## 2021-03-11 DIAGNOSIS — M6281 Muscle weakness (generalized): Secondary | ICD-10-CM | POA: Diagnosis not present

## 2021-03-11 DIAGNOSIS — Z96652 Presence of left artificial knee joint: Secondary | ICD-10-CM | POA: Diagnosis not present

## 2021-03-13 ENCOUNTER — Ambulatory Visit: Payer: Medicare Other | Admitting: Cardiology

## 2021-03-13 DIAGNOSIS — M25662 Stiffness of left knee, not elsewhere classified: Secondary | ICD-10-CM | POA: Diagnosis not present

## 2021-03-13 DIAGNOSIS — Z96652 Presence of left artificial knee joint: Secondary | ICD-10-CM | POA: Diagnosis not present

## 2021-03-13 DIAGNOSIS — M6281 Muscle weakness (generalized): Secondary | ICD-10-CM | POA: Diagnosis not present

## 2021-03-16 ENCOUNTER — Other Ambulatory Visit: Payer: Self-pay

## 2021-03-16 ENCOUNTER — Encounter: Payer: Self-pay | Admitting: Cardiology

## 2021-03-16 ENCOUNTER — Ambulatory Visit (INDEPENDENT_AMBULATORY_CARE_PROVIDER_SITE_OTHER): Payer: Medicare Other | Admitting: Cardiology

## 2021-03-16 VITALS — BP 120/70 | HR 74 | Ht 66.0 in | Wt 146.0 lb

## 2021-03-16 DIAGNOSIS — I10 Essential (primary) hypertension: Secondary | ICD-10-CM | POA: Diagnosis not present

## 2021-03-16 DIAGNOSIS — M6281 Muscle weakness (generalized): Secondary | ICD-10-CM | POA: Diagnosis not present

## 2021-03-16 DIAGNOSIS — I2510 Atherosclerotic heart disease of native coronary artery without angina pectoris: Secondary | ICD-10-CM

## 2021-03-16 DIAGNOSIS — I483 Typical atrial flutter: Secondary | ICD-10-CM

## 2021-03-16 DIAGNOSIS — M25662 Stiffness of left knee, not elsewhere classified: Secondary | ICD-10-CM | POA: Diagnosis not present

## 2021-03-16 DIAGNOSIS — E7800 Pure hypercholesterolemia, unspecified: Secondary | ICD-10-CM

## 2021-03-16 DIAGNOSIS — Z96652 Presence of left artificial knee joint: Secondary | ICD-10-CM | POA: Diagnosis not present

## 2021-03-16 NOTE — Progress Notes (Signed)
Cardiology Consult  Note    Date:  03/16/2021   ID:  [name] Browning, DOB 03-23-1940, MRN [id number]  PCP:  Cassandria Anger, MD  Cardiologist:  Fransico Him, MD   Chief Complaint  Patient presents with   Atrial Flutter   Coronary Artery Disease   Hyperlipidemia    History of Present Illness:  [name] Browning is a 81 y.o. male  with a hx of asthma, remote aflutter s/p RFCA 2013, HTN and HLD.  He had a heart cath done in 1990's that was normal but noted to have coronary Ca with a score of 273 on coronary Ca screening as well as aortic atherosclerosis in 11/2018 .   Lexiscan myoview showed no ischemia. 2D echo showed normal LVF.  He is statin intolerant and is now on Repatha and Zetia.   He is here today for followup and is doing well.  He recently had knee replacement surgery and is doing well.  He denies any chest pain or pressure, SOB, DOE, PND, orthopnea, LE edema, dizziness, palpitations or syncope. He is compliant with his meds and is tolerating meds with no SE.     Past Medical History:  Diagnosis Date   Acute bronchitis 08/11/2016   6/18  H/o asthma as a child...   ADD (attention deficit disorder)    Anemia in chronic illness 06/15/2013   Followed as Primary Care Patient/ [facility]/ [facility] - Fe studies ok 06/15/13 > stool cards received 06/27/2013 > neg x 5> down to 11.4 08/23/13  > referred for hematology 09/04/2013> Alvy Bimler felt it was due to low Testosterone  Resolved as of 08/14/2015         Anxiety    Asthma    AS A CHILD   Ataxia 06/07/2016   2018 poss age related/meds   Atrial flutter ([place])    a. s/p RFCA 07/2011.  Xarelto d/c 08/2011.   Bladder neck obstruction 10/04/2016   Dr Karsten Ro Rapaflo   Cataracts, bilateral    Colon polyps 10/04/2016   Dr Ardis Hughs Due colon 02/2019   Conjunctivitis 09/14/2013   Cough 02/19/2014   DgEs   01/23/16 Unremarkable esophagram. Allergy profile 04/05/2016 >  Eos 0.4 /  IgE  284  RAST pos grass/trees/dust > rec allergy eval     Depression    Diarrhea 02/11/2017   Disturbance in sleep behavior 04/30/2010    Trazodone  Lorazepam prn   DIZZINESS 01/24/2008   Qualifier: Diagnosis of  By: Royal Piedra NP, Tammy     DJD (degenerative joint disease) of knee    Drug-induced memory loss ([place]) 02/22/2014   D/c statin 01/2014 > improved  D/c Trintellix Dr Tish Frederickson   Dyspnea 06/18/2011   Dysrhythmia    hx a-flutter with successful ablation 2013   Essential hypertension 06/18/2011   NAS diet ASA - unable to use due to h/o AVM bleeding - colon 2003   FATIGUE, ACUTE 03/23/2007   Qualifier: Diagnosis of  By: Melvyn Novas MD, Christena Deem    GERD (gastroesophageal reflux disease)    Heart murmur    a. 06/2011 Echo: 65-70%, mild MR   History of lower GI bleeding 09/26/2012   Followed in GI clinic/ [facility]/ [facility]  - colonoscopy 05/22/2001  And 06/14/11 > c/w hemorrhoids and cecal avm    Hyperlipidemia    Hyperlipidemia LDL goal <130 03/22/2007   [facility] as [facility] Care Patient/  [facility]   Zetia    S/P cardiac cath    a.  in 1990's - Dr. Gwenlyn Found - WNL   Scalp laceration 11/20/2014   11/01/14 > staples removed 11/18/2014     Sleep apnea    could not tolerate cpap, uses mouth guard now   TESTICULAR HYPOFUNCTION 09/15/2009   F/u urology on testosterone gel      Past Surgical History:  Procedure Laterality Date   ATRIAL FLUTTER ABLATION N/A 08/06/2011   Procedure: ATRIAL FLUTTER ABLATION;  Surgeon: Deboraha Sprang, MD;  Location: [facility] C[facility] LAB;  Service: Cardiovascular;  Laterality: N/A;   COLONOSCOPY  02/27/2016   LESION REMOVAL N/A 08/02/2014   Procedure: EXCISION UPPER LIP VASCULAR ANOMALY ;  Surgeon: Irene Limbo, MD;  Location: [facility];  Service: Plastics;  Laterality: N/A;   MASTOIDECTOMY     POLYPECTOMY     TONSILLECTOMY     TOTAL KNEE ARTHROPLASTY Left 12/19/2020   Procedure: TOTAL KNEE ARTHROPLASTY;  Surgeon: Dorna Leitz, MD;  Location: [facility];  Service: Orthopedics;  Laterality: Left;   WRIST  FRACTURE SURGERY  2007   left    Current Medications: Current Meds  Medication Sig   Cholecalciferol (VITAMIN D3) 2000 units capsule Take 1 capsule (2,000 Units total) by mouth daily.   cyanocobalamin 1000 MCG tablet Take 1,000 mcg by mouth daily.   Evolocumab (REPATHA SURECLICK) 423 MG/ML SOAJ INJECT 1 PEN INTO THE SKIN EVERY 14 (FOURTEEN) DAYS.   ezetimibe (ZETIA) 10 MG tablet TAKE 1 TABLET BY MOUTH  EVERY DAY   Multiple Vitamin tablet Take 1 tablet by mouth daily.   Multiple Vitamins-Minerals (ZINC PO) Take 1 tablet by mouth daily.   RAPAFLO 8 MG CAPS capsule Take 8 mg by mouth daily with breakfast.    ST JOHNS WORT PO Take 1 tablet by mouth daily.   tiZANidine (ZANAFLEX) 2 MG tablet Take 1 tablet (2 mg total) by mouth every 8 (eight) hours as needed for muscle spasms.    Allergies:   Crestor [rosuvastatin], Trintellix [vortioxetine], and Zocor [simvastatin]   Social History   Socioeconomic History   Marital status: Married    Spouse name: Not on file   Number of children: Not on file   Years of education: Not on file   Highest education level: Not on file  Occupational History   Occupation: RetiredWater quality scientist for [employer]  Tobacco Use   Smoking status: Former    Packs/day: 3.00    Years: 7.00    Pack years: 21.00    Types: Cigarettes    Start date: 1963    Quit date: 02/16/1968    Years since quitting: 53.1   Smokeless tobacco: Never  Vaping Use   Vaping Use: Never used  Substance and Sexual Activity   Alcohol use: Not Currently   Drug use: No   Sexual activity: Yes  Other Topics Concern   Not on file  Social History Narrative   Not on file   Social Determinants of Health   Financial Resource Strain: Not on file  Food Insecurity: Not on file  Transportation Needs: Not on file  Physical Activity: Not on file  Stress: Not on file  Social Connections: Not on file     Family History:  The patient's family history includes Heart disease in his mother;  Hypertension in his brother; Leukemia in his mother; Prostate cancer in his brother and father; Stroke in his father.   ROS:   Please see the history of present illness.    ROS All other systems reviewed and  are negative.  No flowsheet data found.     PHYSICAL EXAM:   VS:  BP 120/70 (BP Location: Left Arm, Patient Position: Sitting, Cuff Size: Normal)    Pulse 74    Ht 5\' 6"  (1.676 m)    Wt 146 lb (66.2 kg)    SpO2 96%    BMI 23.57 kg/m     GEN: Well nourished, well developed in no acute distress HEENT: Normal NECK: No JVD; No carotid bruits LYMPHATICS: No lymphadenopathy CARDIAC:RRR, no murmurs, rubs, gallops RESPIRATORY:  Clear to auscultation without rales, wheezing or rhonchi  ABDOMEN: Soft, non-tender, non-distended MUSCULOSKELETAL:  No edema; No deformity  SKIN: Warm and dry NEUROLOGIC:  Alert and oriented x 3 PSYCHIATRIC:  Normal affect    Wt Readings from Last 3 Encounters:  03/16/21 146 lb (66.2 kg)  12/19/20 145 lb 15.8 oz (66.2 kg)  12/10/20 146 lb (66.2 kg)      Studies/Labs Reviewed:   EKG:  EKG is not ordered today.   Recent Labs: 10/27/2020: TSH 3.24 12/10/2020: ALT 16; BUN 24; Creatinine, Ser 1.12; Potassium 4.3; Sodium 140 12/20/2020: Hemoglobin 11.0; Platelets 297   Lipid Panel    Component Value Date/Time   CHOL 117 10/27/2020 1153   CHOL 110 04/04/2019 0816   TRIG 137.0 10/27/2020 1153   HDL 64.70 10/27/2020 1153   HDL 63 04/04/2019 0816   CHOLHDL 2 10/27/2020 1153   VLDL 27.4 10/27/2020 1153   LDLCALC 25 10/27/2020 1153   LDLCALC 23 10/26/2019 1221   LDLDIRECT 152.0 10/12/2018 1554    Additional studies/ records that were reviewed today include:  Office notes from PCP    ASSESSMENT:    1. Coronary artery calcification seen on CAT scan   2. Essential hypertension   3. Pure hypercholesterolemia   4. Typical atrial flutter (HCC)       PLAN:  In order of problems listed above:  1.  Coronary artery calcifications -coronary Ca  score was elevated at 273 which was 48th% for age and sex matched controls.   -lexiscan myoview showed no ischemia 01/2019 -He has not had any anginal sx since I saw him last -he has been intolerant to statins and is on Praluent -needs aggressive risk factor modification -continue ASA 81mg  daily and statin  2.  HTN -his BP is adequately controlled on exam today -He has not required any antihypertensive meds  3.  HLD -LDL goal < 70 -he did not tolerate simvastatin due to memory issues -I have personally reviewed and interpreted outside labs performed by patient's PCP which showed LDL 25, HDL 64 and TAGs 137 on 10/27/2020  -Continue prescription drug management with Repatha and Zetia 10mg  daily with PRN refills.   4.  PAflutter -s/p remote ablation 2013 -he has not had any reoccurrence of atrial flutter or palpitations    Medication Adjustments/Labs and Tests Ordered: Current medicines are reviewed at length with the patient today.  Concerns regarding medicines are outlined above.  Medication changes, Labs and Tests ordered today are listed in the Patient Instructions below.  There are no Patient Instructions on file for this visit.   Signed, Fransico Him, MD  03/16/2021 9:25 AM    [facility] [address], [place], [place]  [address] Phone: [phone]; Fax: [phone]

## 2021-03-16 NOTE — Patient Instructions (Signed)

## 2021-03-18 DIAGNOSIS — M25662 Stiffness of left knee, not elsewhere classified: Secondary | ICD-10-CM | POA: Diagnosis not present

## 2021-03-18 DIAGNOSIS — M6281 Muscle weakness (generalized): Secondary | ICD-10-CM | POA: Diagnosis not present

## 2021-03-18 DIAGNOSIS — Z96652 Presence of left artificial knee joint: Secondary | ICD-10-CM | POA: Diagnosis not present

## 2021-03-20 DIAGNOSIS — M25662 Stiffness of left knee, not elsewhere classified: Secondary | ICD-10-CM | POA: Diagnosis not present

## 2021-03-20 DIAGNOSIS — Z96652 Presence of left artificial knee joint: Secondary | ICD-10-CM | POA: Diagnosis not present

## 2021-03-20 DIAGNOSIS — M6281 Muscle weakness (generalized): Secondary | ICD-10-CM | POA: Diagnosis not present

## 2021-03-23 ENCOUNTER — Ambulatory Visit: Payer: Medicare Other

## 2021-03-23 ENCOUNTER — Other Ambulatory Visit: Payer: Self-pay

## 2021-03-23 DIAGNOSIS — M6281 Muscle weakness (generalized): Secondary | ICD-10-CM | POA: Diagnosis not present

## 2021-03-23 DIAGNOSIS — Z96652 Presence of left artificial knee joint: Secondary | ICD-10-CM | POA: Diagnosis not present

## 2021-03-23 DIAGNOSIS — M25662 Stiffness of left knee, not elsewhere classified: Secondary | ICD-10-CM | POA: Diagnosis not present

## 2021-03-25 DIAGNOSIS — M25662 Stiffness of left knee, not elsewhere classified: Secondary | ICD-10-CM | POA: Diagnosis not present

## 2021-03-25 DIAGNOSIS — M6281 Muscle weakness (generalized): Secondary | ICD-10-CM | POA: Diagnosis not present

## 2021-03-25 DIAGNOSIS — Z96652 Presence of left artificial knee joint: Secondary | ICD-10-CM | POA: Diagnosis not present

## 2021-03-27 DIAGNOSIS — M25662 Stiffness of left knee, not elsewhere classified: Secondary | ICD-10-CM | POA: Diagnosis not present

## 2021-03-27 DIAGNOSIS — Z96652 Presence of left artificial knee joint: Secondary | ICD-10-CM | POA: Diagnosis not present

## 2021-03-27 DIAGNOSIS — M6281 Muscle weakness (generalized): Secondary | ICD-10-CM | POA: Diagnosis not present

## 2021-03-30 DIAGNOSIS — Z96652 Presence of left artificial knee joint: Secondary | ICD-10-CM | POA: Diagnosis not present

## 2021-03-30 DIAGNOSIS — M6281 Muscle weakness (generalized): Secondary | ICD-10-CM | POA: Diagnosis not present

## 2021-03-30 DIAGNOSIS — M25662 Stiffness of left knee, not elsewhere classified: Secondary | ICD-10-CM | POA: Diagnosis not present

## 2021-04-01 DIAGNOSIS — Z96652 Presence of left artificial knee joint: Secondary | ICD-10-CM | POA: Diagnosis not present

## 2021-04-01 DIAGNOSIS — M6281 Muscle weakness (generalized): Secondary | ICD-10-CM | POA: Diagnosis not present

## 2021-04-01 DIAGNOSIS — M25662 Stiffness of left knee, not elsewhere classified: Secondary | ICD-10-CM | POA: Diagnosis not present

## 2021-04-03 DIAGNOSIS — Z96652 Presence of left artificial knee joint: Secondary | ICD-10-CM | POA: Diagnosis not present

## 2021-04-03 DIAGNOSIS — M25662 Stiffness of left knee, not elsewhere classified: Secondary | ICD-10-CM | POA: Diagnosis not present

## 2021-04-03 DIAGNOSIS — M6281 Muscle weakness (generalized): Secondary | ICD-10-CM | POA: Diagnosis not present

## 2021-04-06 ENCOUNTER — Ambulatory Visit (INDEPENDENT_AMBULATORY_CARE_PROVIDER_SITE_OTHER): Payer: Medicare Other

## 2021-04-06 DIAGNOSIS — M25662 Stiffness of left knee, not elsewhere classified: Secondary | ICD-10-CM | POA: Diagnosis not present

## 2021-04-06 DIAGNOSIS — M6281 Muscle weakness (generalized): Secondary | ICD-10-CM | POA: Diagnosis not present

## 2021-04-06 DIAGNOSIS — Z0000 Encounter for general adult medical examination without abnormal findings: Secondary | ICD-10-CM

## 2021-04-06 DIAGNOSIS — Z96652 Presence of left artificial knee joint: Secondary | ICD-10-CM | POA: Diagnosis not present

## 2021-04-06 NOTE — Patient Instructions (Signed)
[name] Browning , Thank you for taking time to come for your Medicare Wellness Visit. I appreciate your ongoing commitment to your health goals. Please review the following plan we discussed and let me know if I can assist you in the future.   Screening recommendations/referrals: Colonoscopy: no longer required  Recommended yearly ophthalmology/optometry visit for glaucoma screening and checkup Recommended yearly dental visit for hygiene and checkup  Vaccinations: Influenza vaccine: completed  Pneumococcal vaccine: completed  Tdap vaccine: 06/08/2011  due 05/2021 Shingles vaccine: completed     Advanced directives: yes   Conditions/risks identified: none   Next appointment: none   Preventive Care 52 Years and Older, Male Preventive care refers to lifestyle choices and visits with your health care provider that can promote health and wellness. What does preventive care include? A yearly physical exam. This is also called an annual well check. Dental exams once or twice a year. Routine eye exams. Ask your health care provider how often you should have your eyes checked. Personal lifestyle choices, including: Daily care of your teeth and gums. Regular physical activity. Eating a healthy diet. Avoiding tobacco and drug use. Limiting alcohol use. Practicing safe sex. Taking low doses of aspirin every day. Taking vitamin and mineral supplements as recommended by your health care provider. What happens during an annual well check? The services and screenings done by your health care provider during your annual well check will depend on your age, overall health, lifestyle risk factors, and family history of disease. Counseling  Your health care provider may ask you questions about your: Alcohol use. Tobacco use. Drug use. Emotional well-being. Home and relationship well-being. Sexual activity. Eating habits. History of falls. Memory and ability to understand (cognition). Work  and work Statistician. Screening  You may have the following tests or measurements: Height, weight, and BMI. Blood pressure. Lipid and cholesterol levels. These may be checked every 5 years, or more frequently if you are over 3 years old. Skin check. Lung cancer screening. You may have this screening every year starting at age 4 if you have a 30-pack-year history of smoking and currently smoke or have quit within the past 15 years. Fecal occult blood test (FOBT) of the stool. You may have this test every year starting at age 63. Flexible sigmoidoscopy or colonoscopy. You may have a sigmoidoscopy every 5 years or a colonoscopy every 10 years starting at age 6. Prostate cancer screening. Recommendations will vary depending on your family history and other risks. Hepatitis C blood test. Hepatitis B blood test. Sexually transmitted disease (STD) testing. Diabetes screening. This is done by checking your blood sugar (glucose) after you have not eaten for a while (fasting). You may have this done every 1-3 years. Abdominal aortic aneurysm (AAA) screening. You may need this if you are a current or former smoker. Osteoporosis. You may be screened starting at age 69 if you are at high risk. Talk with your health care provider about your test results, treatment options, and if necessary, the need for more tests. Vaccines  Your health care provider may recommend certain vaccines, such as: Influenza vaccine. This is recommended every year. Tetanus, diphtheria, and acellular pertussis (Tdap, Td) vaccine. You may need a Td booster every 10 years. Zoster vaccine. You may need this after age 78. Pneumococcal 13-valent conjugate (PCV13) vaccine. One dose is recommended after age 28. Pneumococcal polysaccharide (PPSV23) vaccine. One dose is recommended after age 80. Talk to your health care provider about which screenings and vaccines you  need and how often you need them. This information is not intended  to replace advice given to you by your health care provider. Make sure you discuss any questions you have with your health care provider. Document Released: 02/28/2015 Document Revised: 10/22/2015 Document Reviewed: 12/03/2014 Elsevier Interactive Patient Education  2017 [place] Prevention in the Home Falls can cause injuries. They can happen to people of all ages. There are many things you can do to make your home safe and to help prevent falls. What can I do on the outside of my home? Regularly fix the edges of walkways and driveways and fix any cracks. Remove anything that might make you trip as you walk through a door, such as a raised step or threshold. Trim any bushes or trees on the path to your home. Use bright outdoor lighting. Clear any walking paths of anything that might make someone trip, such as rocks or tools. Regularly check to see if handrails are loose or broken. Make sure that both sides of any steps have handrails. Any raised decks and porches should have guardrails on the edges. Have any leaves, snow, or ice cleared regularly. Use sand or salt on walking paths during winter. Clean up any spills in your garage right away. This includes oil or grease spills. What can I do in the bathroom? Use night lights. Install grab bars by the toilet and in the tub and shower. Do not use towel bars as grab bars. Use non-skid mats or decals in the tub or shower. If you need to sit down in the shower, use a plastic, non-slip stool. Keep the floor dry. Clean up any water that spills on the floor as soon as it happens. Remove soap buildup in the tub or shower regularly. Attach bath mats securely with double-sided non-slip rug tape. Do not have throw rugs and other things on the floor that can make you trip. What can I do in the bedroom? Use night lights. Make sure that you have a light by your bed that is easy to reach. Do not use any sheets or blankets that are too big  for your bed. They should not hang down onto the floor. Have a firm chair that has side arms. You can use this for support while you get dressed. Do not have throw rugs and other things on the floor that can make you trip. What can I do in the kitchen? Clean up any spills right away. Avoid walking on wet floors. Keep items that you use a lot in easy-to-reach places. If you need to reach something above you, use a strong step stool that has a grab bar. Keep electrical cords out of the way. Do not use floor polish or wax that makes floors slippery. If you must use wax, use non-skid floor wax. Do not have throw rugs and other things on the floor that can make you trip. What can I do with my stairs? Do not leave any items on the stairs. Make sure that there are handrails on both sides of the stairs and use them. Fix handrails that are broken or loose. Make sure that handrails are as long as the stairways. Check any carpeting to make sure that it is firmly attached to the stairs. Fix any carpet that is loose or worn. Avoid having throw rugs at the top or bottom of the stairs. If you do have throw rugs, attach them to the floor with carpet tape. Make sure that  you have a light switch at the top of the stairs and the bottom of the stairs. If you do not have them, ask someone to add them for you. What else can I do to help prevent falls? Wear shoes that: Do not have high heels. Have rubber bottoms. Are comfortable and fit you well. Are closed at the toe. Do not wear sandals. If you use a stepladder: Make sure that it is fully opened. Do not climb a closed stepladder. Make sure that both sides of the stepladder are locked into place. Ask someone to hold it for you, if possible. Clearly mark and make sure that you can see: Any grab bars or handrails. First and last steps. Where the edge of each step is. Use tools that help you move around (mobility aids) if they are needed. These  include: Canes. Walkers. Scooters. Crutches. Turn on the lights when you go into a dark area. Replace any light bulbs as soon as they burn out. Set up your furniture so you have a clear path. Avoid moving your furniture around. If any of your floors are uneven, fix them. If there are any pets around you, be aware of where they are. Review your medicines with your doctor. Some medicines can make you feel dizzy. This can increase your chance of falling. Ask your doctor what other things that you can do to help prevent falls. This information is not intended to replace advice given to you by your health care provider. Make sure you discuss any questions you have with your health care provider. Document Released: 11/28/2008 Document Revised: 07/10/2015 Document Reviewed: 03/08/2014 Elsevier Interactive Patient Education  2017 [employer].

## 2021-04-06 NOTE — Progress Notes (Addendum)
Subjective:   [name] Browning is a 81 y.o. male who presents for an Subsequent Medicare Annual Wellness Visit.  Review of Systems     Cardiac Risk Factors include: advanced age (>59men, >89 women);male gender;dyslipidemia     Objective:    Today's Vitals   There is no height or weight on file to calculate BMI.  Advanced Directives 04/06/2021 12/19/2020 12/10/2020 11/13/2019 07/12/2018 05/25/2017 11/21/2015  Does Patient Have a Medical Advance Directive? Yes Yes Yes Yes Yes Yes Yes  Type of Paramedic of [place];Living will Living will;Healthcare Power of Attorney Living will;Healthcare Power of [place];Living will [place];Living will [place];Living will [place];Living will  Does patient want to make changes to medical advance directive? - No - Patient declined - No - Patient declined - - -  Copy of [place] in Chart? No - copy requested No - copy requested - No - copy requested No - copy requested No - copy requested No - copy requested  Would patient like information on creating a medical advance directive? - - - - - - -  Pre-existing out of facility DNR order (yellow form or pink MOST form) - - - - - - -    Current Medications (verified) Outpatient Encounter Medications as of 04/06/2021  Medication Sig   Cholecalciferol (VITAMIN D3) 2000 units capsule Take 1 capsule (2,000 Units total) by mouth daily.   cyanocobalamin 1000 MCG tablet Take 1,000 mcg by mouth daily.   Evolocumab (REPATHA SURECLICK) 834 MG/ML SOAJ INJECT 1 PEN INTO THE SKIN EVERY 14 (FOURTEEN) DAYS.   ezetimibe (ZETIA) 10 MG tablet TAKE 1 TABLET BY MOUTH  EVERY DAY   Multiple Vitamin tablet Take 1 tablet by mouth daily.   Multiple Vitamins-Minerals (ZINC PO) Take 1 tablet by mouth daily.   RAPAFLO 8 MG CAPS capsule Take 8 mg by mouth daily with breakfast.    ST JOHNS WORT PO Take 1  tablet by mouth daily. (Patient not taking: Reported on 04/06/2021)   tiZANidine (ZANAFLEX) 2 MG tablet Take 1 tablet (2 mg total) by mouth every 8 (eight) hours as needed for muscle spasms. (Patient not taking: Reported on 03/16/2021)   No facility-administered encounter medications on file as of 04/06/2021.    Allergies (verified) Crestor [rosuvastatin], Trintellix [vortioxetine], and Zocor [simvastatin]   History: Past Medical History:  Diagnosis Date   Acute bronchitis 08/11/2016   6/18  H/o asthma as a child...   ADD (attention deficit disorder)    Anemia in chronic illness 06/15/2013   Followed as Primary Care Patient/ [facility]/ [place] - Fe studies ok 06/15/13 > stool cards received 06/27/2013 > neg x 5> down to 11.4 08/23/13  > referred for hematology 09/04/2013> Alvy Bimler felt it was due to low Testosterone  Resolved as of 08/14/2015         Anxiety    Asthma    AS A CHILD   Ataxia 06/07/2016   2018 poss age related/meds   Atrial flutter ([place])    a. s/p RFCA 07/2011.  Xarelto d/c 08/2011.   Bladder neck obstruction 10/04/2016   Dr Karsten Ro Rapaflo   Cataracts, bilateral    Colon polyps 10/04/2016   Dr Ardis Hughs Due colon 02/2019   Conjunctivitis 09/14/2013   Cough 02/19/2014   DgEs   01/23/16 Unremarkable esophagram. Allergy profile 04/05/2016 >  Eos 0.4 /  IgE  284  RAST pos grass/trees/dust >  rec allergy eval    Depression    Diarrhea 02/11/2017   Disturbance in sleep behavior 04/30/2010    Trazodone  Lorazepam prn   DIZZINESS 01/24/2008   Qualifier: Diagnosis of  By: Royal Piedra NP, Tammy     DJD (degenerative joint disease) of knee    Drug-induced memory loss ([place]) 02/22/2014   D/c statin 01/2014 > improved  D/c Trintellix Dr Tish Frederickson   Dyspnea 06/18/2011   Dysrhythmia    hx a-flutter with successful ablation 2013   Essential hypertension 06/18/2011   NAS diet ASA - unable to use due to h/o AVM bleeding - colon 2003   FATIGUE, ACUTE 03/23/2007   Qualifier: Diagnosis of  By: Melvyn Novas MD, Christena Deem     GERD (gastroesophageal reflux disease)    Heart murmur    a. 06/2011 Echo: 65-70%, mild MR   History of lower GI bleeding 09/26/2012   Followed in GI clinic/ [facility]/ [place]  - colonoscopy 05/22/2001  And 06/14/11 > c/w hemorrhoids and cecal avm    Hyperlipidemia    Hyperlipidemia LDL goal <130 03/22/2007   Followed as Primary Care Patient/ [facility]/ [place]   Zetia    S/P cardiac cath    a. in 1990's - Dr. Gwenlyn Found - WNL   Scalp laceration 11/20/2014   11/01/14 > staples removed 11/18/2014     Sleep apnea    could not tolerate cpap, uses mouth guard now   TESTICULAR HYPOFUNCTION 09/15/2009   F/u urology on testosterone gel     Past Surgical History:  Procedure Laterality Date   ATRIAL FLUTTER ABLATION N/A 08/06/2011   Procedure: ATRIAL FLUTTER ABLATION;  Surgeon: Deboraha Sprang, MD;  Location: [facility] C[facility] LAB;  Service: Cardiovascular;  Laterality: N/A;   COLONOSCOPY  02/27/2016   LESION REMOVAL N/A 08/02/2014   Procedure: EXCISION UPPER LIP VASCULAR ANOMALY ;  Surgeon: Irene Limbo, MD;  Location: [place];  Service: Plastics;  Laterality: N/A;   MASTOIDECTOMY     POLYPECTOMY     TONSILLECTOMY     TOTAL KNEE ARTHROPLASTY Left 12/19/2020   Procedure: TOTAL KNEE ARTHROPLASTY;  Surgeon: Dorna Leitz, MD;  Location: [facility];  Service: Orthopedics;  Laterality: Left;   WRIST FRACTURE SURGERY  2007   left   Family History  Problem Relation Age of Onset   Heart disease Mother    Leukemia Mother    Prostate cancer Father    Stroke Father    Hypertension Brother    Prostate cancer Brother    Colon cancer Neg Hx    Stomach cancer Neg Hx    Heart attack Neg Hx    Esophageal cancer Neg Hx    Rectal cancer Neg Hx    Colon polyps Neg Hx    Social History   Socioeconomic History   Marital status: Married    Spouse name: Not on file   Number of children: Not on file   Years of education: Not on file   Highest education level: Not on file  Occupational  History   Occupation: RetiredWater quality scientist for [employer]  Tobacco Use   Smoking status: Former    Packs/day: 3.00    Years: 7.00    Pack years: 21.00    Types: Cigarettes    Start date: 1963    Quit date: 02/16/1968    Years since quitting: 53.1   Smokeless tobacco: Never  Vaping Use   Vaping Use: Never used  Substance and Sexual Activity  Alcohol use: Not Currently   Drug use: No   Sexual activity: Yes  Other Topics Concern   Not on file  Social History Narrative   Not on file   Social Determinants of Health   Financial Resource Strain: Low Risk    Difficulty of Paying Living Expenses: Not hard at all  Food Insecurity: No Food Insecurity   Worried About Charity fundraiser in the Last Year: Never true   [place] in the Last Year: Never true  Transportation Needs: No Transportation Needs   Lack of Transportation (Medical): No   Lack of Transportation (Non-Medical): No  Physical Activity: Insufficiently Active   Days of Exercise per Week: 3 days   Minutes of Exercise per Session: 30 min  Stress: No Stress Concern Present   Feeling of Stress : Not at all  Social Connections: Moderately Integrated   Frequency of Communication with Friends and Family: Twice a week   Frequency of Social Gatherings with Friends and Family: Twice a week   Attends Religious Services: Never   Marine scientist or Organizations: Yes   Attends Music therapist: More than 4 times per year   Marital Status: Married    Tobacco Counseling Counseling given: Not Answered   Clinical Intake:  Pre-visit preparation completed: Yes  Pain : No/denies pain     Nutritional Risks: None Diabetes: No  How often do you need to have someone help you when you read instructions, pamphlets, or other written materials from your doctor or pharmacy?: 1 - Never What is the last grade level you completed in school?: college  Diabetic?no   Interpreter Needed?: No  Information  entered by :: [place] of Daily Living In your present state of health, do you have any difficulty performing the following activities: 04/06/2021 12/19/2020  Hearing? N N  Vision? N N  Difficulty concentrating or making decisions? N N  Walking or climbing stairs? N Y  Dressing or bathing? N N  Doing errands, shopping? N N  Preparing Food and eating ? N -  Using the Toilet? N -  In the past six months, have you accidently leaked urine? N -  Do you have problems with loss of bowel control? N -  Managing your Medications? N -  Managing your Finances? N -  Housekeeping or managing your Housekeeping? N -  Some recent data might be hidden    Patient Care Team: Plotnikov, Evie Lacks, MD as PCP - General (Internal Medicine) Sueanne Margarita, MD as PCP - Cardiology (Cardiology) Chucky May, MD as Consulting Physician (Psychiatry) Kathie Rhodes, MD (Inactive) as Consulting Physician (Urology) Deboraha Sprang, MD as Consulting Physician (Cardiology)  Indicate any recent Medical Services you may have received from other than [facility] providers in the past year (date may be approximate).     Assessment:   This is a routine wellness examination for [name] Browning.  Hearing/Vision screen Vision Screening - Comments:: Annual eye exams wear glasses /contacts   Dietary issues and exercise activities discussed: Current Exercise Habits: Home exercise routine, Type of exercise: walking, Time (Minutes): 30, Frequency (Times/Week): 3, Weekly Exercise (Minutes/Week): 90, Intensity: Mild, Exercise limited by: orthopedic condition(s)   Goals Addressed             This Visit's Progress    Patient Stated   On track    Continue to enjoy reading, exercise, and eat healthy. Enjoy life and travel.  Depression Screen PHQ 2/9 Scores 04/06/2021 04/06/2021 11/13/2019 07/12/2018 05/25/2017 10/04/2016 06/07/2016  PHQ - 2 Score 0 0 0 0 1 0 0  PHQ- 9 Score - - - - 1 0 -    Fall Risk Fall Risk   04/06/2021 11/13/2019 07/12/2018 05/25/2017 06/07/2016  Falls in the past year? 0 0 1 No No  Number falls in past yr: 0 0 0 - -  Injury with Fall? 0 0 0 - -  Risk for fall due to : No Fall Risks No Fall Risks - Impaired mobility;Impaired balance/gait -  Follow up Falls evaluation completed Falls evaluation completed;Education provided - - -    FALL RISK PREVENTION PERTAINING TO THE HOME:  Any stairs in or around the home? No  If so, are there any without handrails? No  Home free of loose throw rugs in walkways, pet beds, electrical cords, etc? Yes  Adequate lighting in your home to reduce risk of falls? Yes   ASSISTIVE DEVICES UTILIZED TO PREVENT FALLS:  Life alert? No  Use of a cane, walker or w/c? No  Grab bars in the bathroom? Yes  Shower chair or bench in shower? Yes  Elevated toilet seat or a handicapped toilet? Yes    Cognitive Function: Normal cognitive status assessed by direct observation by this Nurse Health Advisor. No abnormalities found.   MMSE - Mini Mental State Exam 05/25/2017 05/19/2016  Not completed: Refused -  Orientation to time - 5  Orientation to Place - 5  Registration - 3  Attention/ Calculation - 5  Recall - 2  Language- name 2 objects - 2  Language- repeat - 1  Language- follow 3 step command - 3  Language- read & follow direction - 1  Write a sentence - 1  Copy design - 1  Total score - 29        Immunizations Immunization History  Administered Date(s) Administered   Fluad Quad(high Dose 65+) 10/12/2018, 11/13/2019, 10/27/2020   Influenza Split 01/15/2013, 01/28/2014   Influenza Whole 01/16/2011, 01/06/2012, 11/16/2015   Influenza, High Dose Seasonal PF 12/02/2016, 10/31/2017   Influenza,inj,Quad PF,6+ Mos 11/18/2014   Influenza-Unspecified 10/31/2017   PFIZER Comirnaty(Gray Top)Covid-19 Tri-Sucrose Vaccine 06/14/2020   PFIZER(Purple Top)SARS-COV-2 Vaccination 03/06/2019, 03/27/2019, 10/23/2019   Pneumococcal Conjugate-13 07/22/2014    Pneumococcal Polysaccharide-23 04/15/2008, 06/07/2016   Tdap 06/08/2011   Zoster Recombinat (Shingrix) 10/16/2017, 01/23/2018    TDAP status: Up to date  Flu Vaccine status: Up to date  Pneumococcal vaccine status: Up to date  Covid-19 vaccine status: Completed vaccines  Qualifies for Shingles Vaccine? Yes   Zostavax completed Yes   Shingrix Completed?: Yes  Screening Tests Health Maintenance  Topic Date Due   COVID-19 Vaccine (5 - Booster for Pfizer series) 08/09/2020   TETANUS/TDAP  06/07/2021   Pneumonia Vaccine 38+ Years old  Completed   INFLUENZA VACCINE  Completed   Zoster Vaccines- Shingrix  Completed   HPV VACCINES  Aged Out    Health Maintenance  Health Maintenance Due  Topic Date Due   COVID-19 Vaccine (5 - Booster for [place] series) 08/09/2020    Colorectal cancer screening: No longer required.   Lung Cancer Screening: (Low Dose CT Chest recommended if Age 46-80 years, 30 pack-year currently smoking OR have quit w/in 15years.) does not qualify.   Lung Cancer Screening Referral: n/a  Additional Screening:  Hepatitis C Screening: does not qualify;  Vision Screening: Recommended annual ophthalmology exams for early detection of glaucoma and other disorders of the  eye. Is the patient up to date with their annual eye exam?  Yes  Who is the provider or what is the name of the office in which the patient attends annual eye exams? Dr.Shipiro  If pt is not established with a provider, would they like to be referred to a provider to establish care? No .   Dental Screening: Recommended annual dental exams for proper oral hygiene  Community Resource Referral / Chronic Care Management: CRR required this visit?  No   CCM required this visit?  No      Plan:     I have personally reviewed and noted the following in the patients chart:   Medical and social history Use of alcohol, tobacco or illicit drugs  Current medications and supplements including  opioid prescriptions. Patient is not currently taking opioid prescriptions. Functional ability and status Nutritional status Physical activity Advanced directives List of other physicians Hospitalizations, surgeries, and ER visits in previous 12 months Vitals Screenings to include cognitive, depression, and falls Referrals and appointments  In addition, I have reviewed and discussed with patient certain preventive protocols, quality metrics, and best practice recommendations. A written personalized care plan for preventive services as well as general preventive health recommendations were provided to patient.     Randel Pigg, LPN   1/55/2080   Nurse Notes: none    Medical screening examination/treatment/procedure(s) were performed by non-physician practitioner and as supervising physician I was immediately available for consultation/collaboration.  I agree with above. Lew Dawes, MD

## 2021-04-08 DIAGNOSIS — M25662 Stiffness of left knee, not elsewhere classified: Secondary | ICD-10-CM | POA: Diagnosis not present

## 2021-04-08 DIAGNOSIS — Z96652 Presence of left artificial knee joint: Secondary | ICD-10-CM | POA: Diagnosis not present

## 2021-04-08 DIAGNOSIS — M6281 Muscle weakness (generalized): Secondary | ICD-10-CM | POA: Diagnosis not present

## 2021-04-09 ENCOUNTER — Encounter: Payer: Self-pay | Admitting: Podiatry

## 2021-04-09 ENCOUNTER — Ambulatory Visit (INDEPENDENT_AMBULATORY_CARE_PROVIDER_SITE_OTHER): Payer: Medicare Other | Admitting: Podiatry

## 2021-04-09 ENCOUNTER — Other Ambulatory Visit: Payer: Self-pay

## 2021-04-09 DIAGNOSIS — L600 Ingrowing nail: Secondary | ICD-10-CM | POA: Diagnosis not present

## 2021-04-09 DIAGNOSIS — B351 Tinea unguium: Secondary | ICD-10-CM | POA: Diagnosis not present

## 2021-04-09 DIAGNOSIS — M79675 Pain in left toe(s): Secondary | ICD-10-CM

## 2021-04-09 DIAGNOSIS — I2510 Atherosclerotic heart disease of native coronary artery without angina pectoris: Secondary | ICD-10-CM | POA: Diagnosis not present

## 2021-04-09 DIAGNOSIS — M79674 Pain in right toe(s): Secondary | ICD-10-CM

## 2021-04-09 NOTE — Patient Instructions (Signed)

## 2021-04-10 DIAGNOSIS — M25662 Stiffness of left knee, not elsewhere classified: Secondary | ICD-10-CM | POA: Diagnosis not present

## 2021-04-10 DIAGNOSIS — Z96652 Presence of left artificial knee joint: Secondary | ICD-10-CM | POA: Diagnosis not present

## 2021-04-10 DIAGNOSIS — M6281 Muscle weakness (generalized): Secondary | ICD-10-CM | POA: Diagnosis not present

## 2021-04-10 NOTE — Progress Notes (Signed)
Subjective:   Patient ID: [name] Browning, male   DOB: 81 y.o.   MRN: [id number]   HPI Patient presents stating that he has a painful ingrown toenail of his left big toe and is tried to trim and soak it without relief and does have fungus toenails of a generalized nature on all beds.  Patient does not currently smoke likes to be active if possible   Review of Systems  All other systems reviewed and are negative.      Objective:  Physical Exam Vitals and nursing note reviewed.  Constitutional:      Appearance: He is well-developed.  Pulmonary:     Effort: Pulmonary effort is normal.  Musculoskeletal:        General: Normal range of motion.  Skin:    General: Skin is warm.  Neurological:     Mental Status: He is alert.    Neurovascular status found to be intact muscle strength was found to be adequate range of motion adequate with patient found to have good digital perfusion well oriented.  Patient is found to have incurvated medial border left hallux that is mildly painful no active redness no active drainage and thickness of underlying nailbeds 1-5 both feet with only mild discomfort     Assessment:  Ingrown toenail deformity left hallux with incurvation of the bed     Plan:  H&P reviewed condition recommended correction of deformity explained procedure risk patient wants surgery understanding risk and at this point I went ahead and I had him sign consent form I infiltrated the left hallux 60 mg like Marcaine mixture sterile prep done using sterile instrumentation remove the border and exposed the base applied phenol 3 applications 30 seconds followed by alcohol by sterile dressing gave instructions on soaks and reappoint to recheck.  Discussed mycotic nail infection recommended good trimming's versus any other treatment

## 2021-04-20 ENCOUNTER — Ambulatory Visit (INDEPENDENT_AMBULATORY_CARE_PROVIDER_SITE_OTHER): Payer: Medicare Other | Admitting: Podiatry

## 2021-04-20 ENCOUNTER — Other Ambulatory Visit: Payer: Self-pay

## 2021-04-20 DIAGNOSIS — I2510 Atherosclerotic heart disease of native coronary artery without angina pectoris: Secondary | ICD-10-CM

## 2021-04-20 DIAGNOSIS — L03032 Cellulitis of left toe: Secondary | ICD-10-CM | POA: Diagnosis not present

## 2021-04-20 MED ORDER — DOXYCYCLINE HYCLATE 100 MG PO TABS: 100.0000 mg | ORAL_TABLET | Freq: Two times a day (BID) | ORAL | 0 refills | Status: DC

## 2021-04-22 ENCOUNTER — Ambulatory Visit: Payer: Medicare Other | Admitting: Podiatry

## 2021-04-22 NOTE — Progress Notes (Signed)
Subjective:  ? ?Patient ID: [name] Browning, male   DOB: 81 y.o.   MRN: [id number]  ? ?HPI ?Patient presents with caregiver concerned about some redness around the big toe left stating he wants to make sure it is healed okay and it is mildly tender after having ingrown removed ? ? ?ROS ? ? ?   ?Objective:  ?Physical Exam  ?Neurovascular status intact with patient found to have mild erythema around the hallux site lateral border left that was removed no proximal edema erythema drainage noted appears to be localized mild tenderness ? ?   ?Assessment:  ?Localized paronychia infection left hallux no indication of systemic spread ? ?   ?Plan:  ?Reviewed continued soaks and usage Neosporin and precautionary placed on antibiotic for a week.  If any increase in redness or any increase in drainage were to occur let us know but it is normal to have some postoperatively and should resolve over the next several weeks ?   ? ? ?

## 2021-05-15 ENCOUNTER — Other Ambulatory Visit: Payer: Self-pay | Admitting: Pharmacist

## 2021-05-15 MED ORDER — REPATHA SURECLICK 140 MG/ML ~~LOC~~ SOAJ: 1.0000 "pen " | SUBCUTANEOUS | 3 refills | Status: DC

## 2021-06-01 DIAGNOSIS — M25562 Pain in left knee: Secondary | ICD-10-CM | POA: Diagnosis not present

## 2021-06-12 DIAGNOSIS — Z23 Encounter for immunization: Secondary | ICD-10-CM | POA: Diagnosis not present

## 2021-08-04 DIAGNOSIS — H6121 Impacted cerumen, right ear: Secondary | ICD-10-CM | POA: Insufficient documentation

## 2021-08-04 DIAGNOSIS — H93293 Other abnormal auditory perceptions, bilateral: Secondary | ICD-10-CM | POA: Diagnosis not present

## 2021-08-26 DIAGNOSIS — H524 Presbyopia: Secondary | ICD-10-CM | POA: Diagnosis not present

## 2021-08-26 DIAGNOSIS — Z961 Presence of intraocular lens: Secondary | ICD-10-CM | POA: Diagnosis not present

## 2021-08-26 DIAGNOSIS — H5213 Myopia, bilateral: Secondary | ICD-10-CM | POA: Diagnosis not present

## 2021-09-29 DIAGNOSIS — H903 Sensorineural hearing loss, bilateral: Secondary | ICD-10-CM | POA: Diagnosis not present

## 2021-11-02 ENCOUNTER — Ambulatory Visit (INDEPENDENT_AMBULATORY_CARE_PROVIDER_SITE_OTHER): Payer: Medicare Other | Admitting: Internal Medicine

## 2021-11-02 ENCOUNTER — Encounter: Payer: Self-pay | Admitting: Internal Medicine

## 2021-11-02 VITALS — BP 130/82 | HR 63 | Temp 97.6°F | Ht 66.0 in | Wt 147.8 lb

## 2021-11-02 DIAGNOSIS — Z23 Encounter for immunization: Secondary | ICD-10-CM | POA: Diagnosis not present

## 2021-11-02 DIAGNOSIS — I2510 Atherosclerotic heart disease of native coronary artery without angina pectoris: Secondary | ICD-10-CM

## 2021-11-02 DIAGNOSIS — E7800 Pure hypercholesterolemia, unspecified: Secondary | ICD-10-CM | POA: Diagnosis not present

## 2021-11-02 DIAGNOSIS — G479 Sleep disorder, unspecified: Secondary | ICD-10-CM

## 2021-11-02 DIAGNOSIS — Z7184 Encounter for health counseling related to travel: Secondary | ICD-10-CM | POA: Diagnosis not present

## 2021-11-02 DIAGNOSIS — D638 Anemia in other chronic diseases classified elsewhere: Secondary | ICD-10-CM | POA: Diagnosis not present

## 2021-11-02 DIAGNOSIS — N320 Bladder-neck obstruction: Secondary | ICD-10-CM | POA: Diagnosis not present

## 2021-11-02 LAB — URINALYSIS
Bilirubin Urine: NEGATIVE
Hgb urine dipstick: NEGATIVE
Ketones, ur: NEGATIVE
Leukocytes,Ua: NEGATIVE
Nitrite: NEGATIVE
Specific Gravity, Urine: 1.02 (ref 1.000–1.030)
Total Protein, Urine: NEGATIVE
Urine Glucose: NEGATIVE
Urobilinogen, UA: 0.2 (ref 0.0–1.0)
pH: 6 (ref 5.0–8.0)

## 2021-11-02 LAB — COMPREHENSIVE METABOLIC PANEL
ALT: 16 U/L (ref 0–53)
AST: 17 U/L (ref 0–37)
Albumin: 4.4 g/dL (ref 3.5–5.2)
Alkaline Phosphatase: 103 U/L (ref 39–117)
BUN: 22 mg/dL (ref 6–23)
CO2: 27 mEq/L (ref 19–32)
Calcium: 9.8 mg/dL (ref 8.4–10.5)
Chloride: 104 mEq/L (ref 96–112)
Creatinine, Ser: 1.17 mg/dL (ref 0.40–1.50)
GFR: 58.8 mL/min — ABNORMAL LOW (ref >60.00)
Glucose, Bld: 96 mg/dL (ref 70–99)
Potassium: 4.6 mEq/L (ref 3.5–5.1)
Sodium: 142 mEq/L (ref 135–145)
Total Bilirubin: 0.5 mg/dL (ref 0.2–1.2)
Total Protein: 7.2 g/dL (ref 6.0–8.3)

## 2021-11-02 LAB — CBC WITH DIFFERENTIAL/PLATELET
Basophils Absolute: 0 10*3/uL (ref 0.0–0.1)
Basophils Relative: 0.6 % (ref 0.0–3.0)
Eosinophils Absolute: 0.1 10*3/uL (ref 0.0–0.7)
Eosinophils Relative: 2.6 % (ref 0.0–5.0)
HCT: 38.6 % — ABNORMAL LOW (ref 39.0–52.0)
Hemoglobin: 13.1 g/dL (ref 13.0–17.0)
Lymphocytes Relative: 28 % (ref 12.0–46.0)
Lymphs Abs: 1.5 10*3/uL (ref 0.7–4.0)
MCHC: 33.8 g/dL (ref 30.0–36.0)
MCV: 91.9 fl (ref 78.0–100.0)
Monocytes Absolute: 0.5 10*3/uL (ref 0.1–1.0)
Monocytes Relative: 10.1 % (ref 3.0–12.0)
Neutro Abs: 3.2 10*3/uL (ref 1.4–7.7)
Neutrophils Relative %: 58.7 % (ref 43.0–77.0)
Platelets: 305 10*3/uL (ref 150.0–400.0)
RBC: 4.2 Mil/uL — ABNORMAL LOW (ref 4.22–5.81)
RDW: 13.2 % (ref 11.5–15.5)
WBC: 5.4 10*3/uL (ref 4.0–10.5)

## 2021-11-02 LAB — LIPID PANEL
Cholesterol: 121 mg/dL (ref 0–200)
HDL: 65.7 mg/dL (ref >39.00)
LDL Cholesterol: 24 mg/dL (ref 0–99)
NonHDL: 55.29
Total CHOL/HDL Ratio: 2
Triglycerides: 156 mg/dL — ABNORMAL HIGH (ref 0.0–149.0)
VLDL: 31.2 mg/dL (ref 0.0–40.0)

## 2021-11-02 LAB — PSA: PSA: 0.68 ng/mL (ref 0.10–4.00)

## 2021-11-02 LAB — TSH: TSH: 5.06 u[IU]/mL (ref 0.35–5.50)

## 2021-11-02 MED ORDER — EZETIMIBE 10 MG PO TABS: 10.0000 mg | ORAL_TABLET | Freq: Every day | ORAL | 3 refills | Status: DC

## 2021-11-02 MED ORDER — AZITHROMYCIN 250 MG PO TABS: ORAL_TABLET | ORAL | 0 refills | Status: DC

## 2021-11-02 NOTE — Assessment & Plan Note (Signed)
Off meds 

## 2021-11-02 NOTE — Progress Notes (Signed)
Subjective:  Patient ID: [name] Browning, male    DOB: October 12, 1940  Age: 81 y.o. MRN: [id number]  CC: Annual Exam   HPI KENNEN STAMMER presents for CAD, dyslipidemia, BPH  Outpatient Medications Prior to Visit  Medication Sig Dispense Refill   Cholecalciferol (VITAMIN D3) 2000 units capsule Take 1 capsule (2,000 Units total) by mouth daily. 100 capsule 3   cyanocobalamin 1000 MCG tablet Take 1,000 mcg by mouth daily.     Evolocumab (REPATHA SURECLICK) 008 MG/ML SOAJ Inject 1 pen. into the skin every 14 (fourteen) days. 6 mL 3   Multiple Vitamin tablet Take 1 tablet by mouth daily.     Multiple Vitamins-Minerals (ZINC PO) Take 1 tablet by mouth daily.     RAPAFLO 8 MG CAPS capsule Take 8 mg by mouth daily with breakfast.      ezetimibe (ZETIA) 10 MG tablet TAKE 1 TABLET BY MOUTH  EVERY DAY 90 tablet 3   doxycycline (VIBRA-TABS) 100 MG tablet Take 1 tablet (100 mg total) by mouth 2 (two) times daily. (Patient not taking: Reported on 11/02/2021) 15 tablet 0   No facility-administered medications prior to visit.    ROS: Review of Systems  Constitutional:  Negative for appetite change, fatigue and unexpected weight change.  HENT:  Negative for congestion, nosebleeds, sneezing, sore throat and trouble swallowing.   Eyes:  Negative for itching and visual disturbance.  Respiratory:  Negative for cough.   Cardiovascular:  Negative for chest pain, palpitations and leg swelling.  Gastrointestinal:  Negative for abdominal distention, blood in stool, diarrhea and nausea.  Genitourinary:  Negative for frequency and hematuria.  Musculoskeletal:  Positive for arthralgias. Negative for back pain, gait problem, joint swelling and neck pain.  Skin:  Negative for rash.  Neurological:  Negative for dizziness, tremors, speech difficulty and weakness.  Psychiatric/Behavioral:  Negative for agitation, dysphoric mood and sleep disturbance. The patient is not nervous/anxious.     Objective:  BP  130/82 (BP Location: Left Arm)   Pulse 63   Temp 97.6 F (36.4 C) (Oral)   Ht '5\' 6"'$  (1.676 m)   Wt 147 lb 12.8 oz (67 kg)   SpO2 95%   BMI 23.86 kg/m   BP Readings from Last 3 Encounters:  11/02/21 130/82  03/16/21 120/70  12/20/20 (!) 150/73    Wt Readings from Last 3 Encounters:  11/02/21 147 lb 12.8 oz (67 kg)  03/16/21 146 lb (66.2 kg)  12/19/20 145 lb 15.8 oz (66.2 kg)    Physical Exam Constitutional:      General: He is not in acute distress.    Appearance: He is well-developed.     Comments: NAD  Eyes:     Conjunctiva/sclera: Conjunctivae normal.     Pupils: Pupils are equal, round, and reactive to light.  Neck:     Thyroid: No thyromegaly.     Vascular: No JVD.  Cardiovascular:     Rate and Rhythm: Normal rate and regular rhythm.     Heart sounds: Normal heart sounds. No murmur heard.    No friction rub. No gallop.  Pulmonary:     Effort: Pulmonary effort is normal. No respiratory distress.     Breath sounds: Normal breath sounds. No wheezing or rales.  Chest:     Chest wall: No tenderness.  Abdominal:     General: Bowel sounds are normal. There is no distension.     Palpations: Abdomen is soft. There is no mass.  Tenderness: There is no abdominal tenderness. There is no guarding or rebound.  Musculoskeletal:        General: No tenderness. Normal range of motion.     Cervical back: Normal range of motion.  Lymphadenopathy:     Cervical: No cervical adenopathy.  Skin:    General: Skin is warm and dry.     Findings: No rash.  Neurological:     Mental Status: He is alert and oriented to person, place, and time.     Cranial Nerves: No cranial nerve deficit.     Motor: No abnormal muscle tone.     Coordination: Coordination normal.     Gait: Gait normal.     Deep Tendon Reflexes: Reflexes are normal and symmetric.  Psychiatric:        Behavior: Behavior normal.        Thought Content: Thought content normal.        Judgment: Judgment normal.      Lab Results  Component Value Date   WBC 9.7 12/20/2020   HGB 11.0 (L) 12/20/2020   HCT 32.2 (L) 12/20/2020   PLT 297 12/20/2020   GLUCOSE 105 (H) 12/10/2020   CHOL 117 10/27/2020   TRIG 137.0 10/27/2020   HDL 64.70 10/27/2020   LDLDIRECT 152.0 10/12/2018   LDLCALC 25 10/27/2020   ALT 16 12/10/2020   AST 22 12/10/2020   NA 140 12/10/2020   K 4.3 12/10/2020   CL 105 12/10/2020   CREATININE 1.12 12/10/2020   BUN 24 (H) 12/10/2020   CO2 26 12/10/2020   TSH 3.24 10/27/2020   PSA 0.64 10/27/2020   INR 0.9 12/10/2020   HGBA1C 5.8 (H) 05/19/2016    No results found.  Assessment & Plan:   Problem List Items Addressed This Visit     Anemia in chronic illness - Primary    Chronic anemia Check CBC, iron Feeling well      Relevant Orders   CBC with Differential/Platelet   TSH   Iron, TIBC and Ferritin Panel   Bladder neck obstruction   Relevant Orders   PSA   Disturbance in sleep behavior    Off meds      Hyperlipidemia    Repatha Zetia      Relevant Medications   ezetimibe (ZETIA) 10 MG tablet   Other Relevant Orders   CBC with Differential/Platelet   Comprehensive metabolic panel   TSH   Urinalysis   Lipid panel   Travel advice encounter    [place] - Z pack prn      Other Visit Diagnoses     Needs flu shot       Relevant Orders   Flu Vaccine QUAD High Dose(Fluad) (Completed)   Need for vaccination with 20-polyvalent pneumococcal conjugate vaccine       Relevant Orders   Pneumococcal conjugate vaccine 20-valent (Prevnar 20) (Completed)         Meds ordered this encounter  Medications   ezetimibe (ZETIA) 10 MG tablet    Sig: Take 1 tablet (10 mg total) by mouth daily.    Dispense:  90 tablet    Refill:  3    Requesting 1 year supply   azithromycin (ZITHROMAX Z-PAK) 250 MG tablet    Sig: As directed    Dispense:  6 tablet    Refill:  0      Follow-up: Return in about 6 months (around 05/03/2022) for a follow-up visit.  Walker Kehr, MD

## 2021-11-02 NOTE — Assessment & Plan Note (Signed)
Repatha Zetia

## 2021-11-02 NOTE — Assessment & Plan Note (Signed)
[place] - Z pack prn

## 2021-11-02 NOTE — Assessment & Plan Note (Signed)
Chronic anemia Check CBC, iron Feeling well

## 2021-11-03 ENCOUNTER — Telehealth: Payer: Self-pay | Admitting: Internal Medicine

## 2021-11-03 LAB — IRON,TIBC AND FERRITIN PANEL
%SAT: 28 % (calc) (ref 20–48)
Ferritin: 77 ng/mL (ref 24–380)
Iron: 99 ug/dL (ref 50–180)
TIBC: 353 mcg/dL (calc) (ref 250–425)

## 2021-11-03 MED ORDER — EZETIMIBE 10 MG PO TABS: 10.0000 mg | ORAL_TABLET | Freq: Every day | ORAL | 3 refills | Status: DC

## 2021-11-03 NOTE — Telephone Encounter (Signed)
Resent rx to [facility].Marland KitchenJohny Browning

## 2021-11-03 NOTE — Telephone Encounter (Signed)
Pt's wife states that ezetimibe (ZETIA) 10 MG tablet RX was sent to the wrong pharmacy.   Please resend to [facility])  Phone:  [phone]  Fax:  [phone]

## 2021-11-11 DIAGNOSIS — L812 Freckles: Secondary | ICD-10-CM | POA: Diagnosis not present

## 2021-11-11 DIAGNOSIS — L570 Actinic keratosis: Secondary | ICD-10-CM | POA: Diagnosis not present

## 2021-11-11 DIAGNOSIS — Z85828 Personal history of other malignant neoplasm of skin: Secondary | ICD-10-CM | POA: Diagnosis not present

## 2021-11-11 DIAGNOSIS — D1801 Hemangioma of skin and subcutaneous tissue: Secondary | ICD-10-CM | POA: Diagnosis not present

## 2021-11-11 DIAGNOSIS — L820 Inflamed seborrheic keratosis: Secondary | ICD-10-CM | POA: Diagnosis not present

## 2021-11-11 DIAGNOSIS — D225 Melanocytic nevi of trunk: Secondary | ICD-10-CM | POA: Diagnosis not present

## 2021-11-11 DIAGNOSIS — L821 Other seborrheic keratosis: Secondary | ICD-10-CM | POA: Diagnosis not present

## 2021-11-16 DIAGNOSIS — Z23 Encounter for immunization: Secondary | ICD-10-CM | POA: Diagnosis not present

## 2021-11-20 DIAGNOSIS — Z125 Encounter for screening for malignant neoplasm of prostate: Secondary | ICD-10-CM | POA: Diagnosis not present

## 2021-11-21 IMAGING — DX DG CHEST 2V
2 series · 2 of 2 positions shown · non-contrast
Comparison: 04/05/2016

CLINICAL DATA: Pre-op chest exam, quit smoking at age 28

EXAM:
CHEST - 2 VIEW

[chest pa]
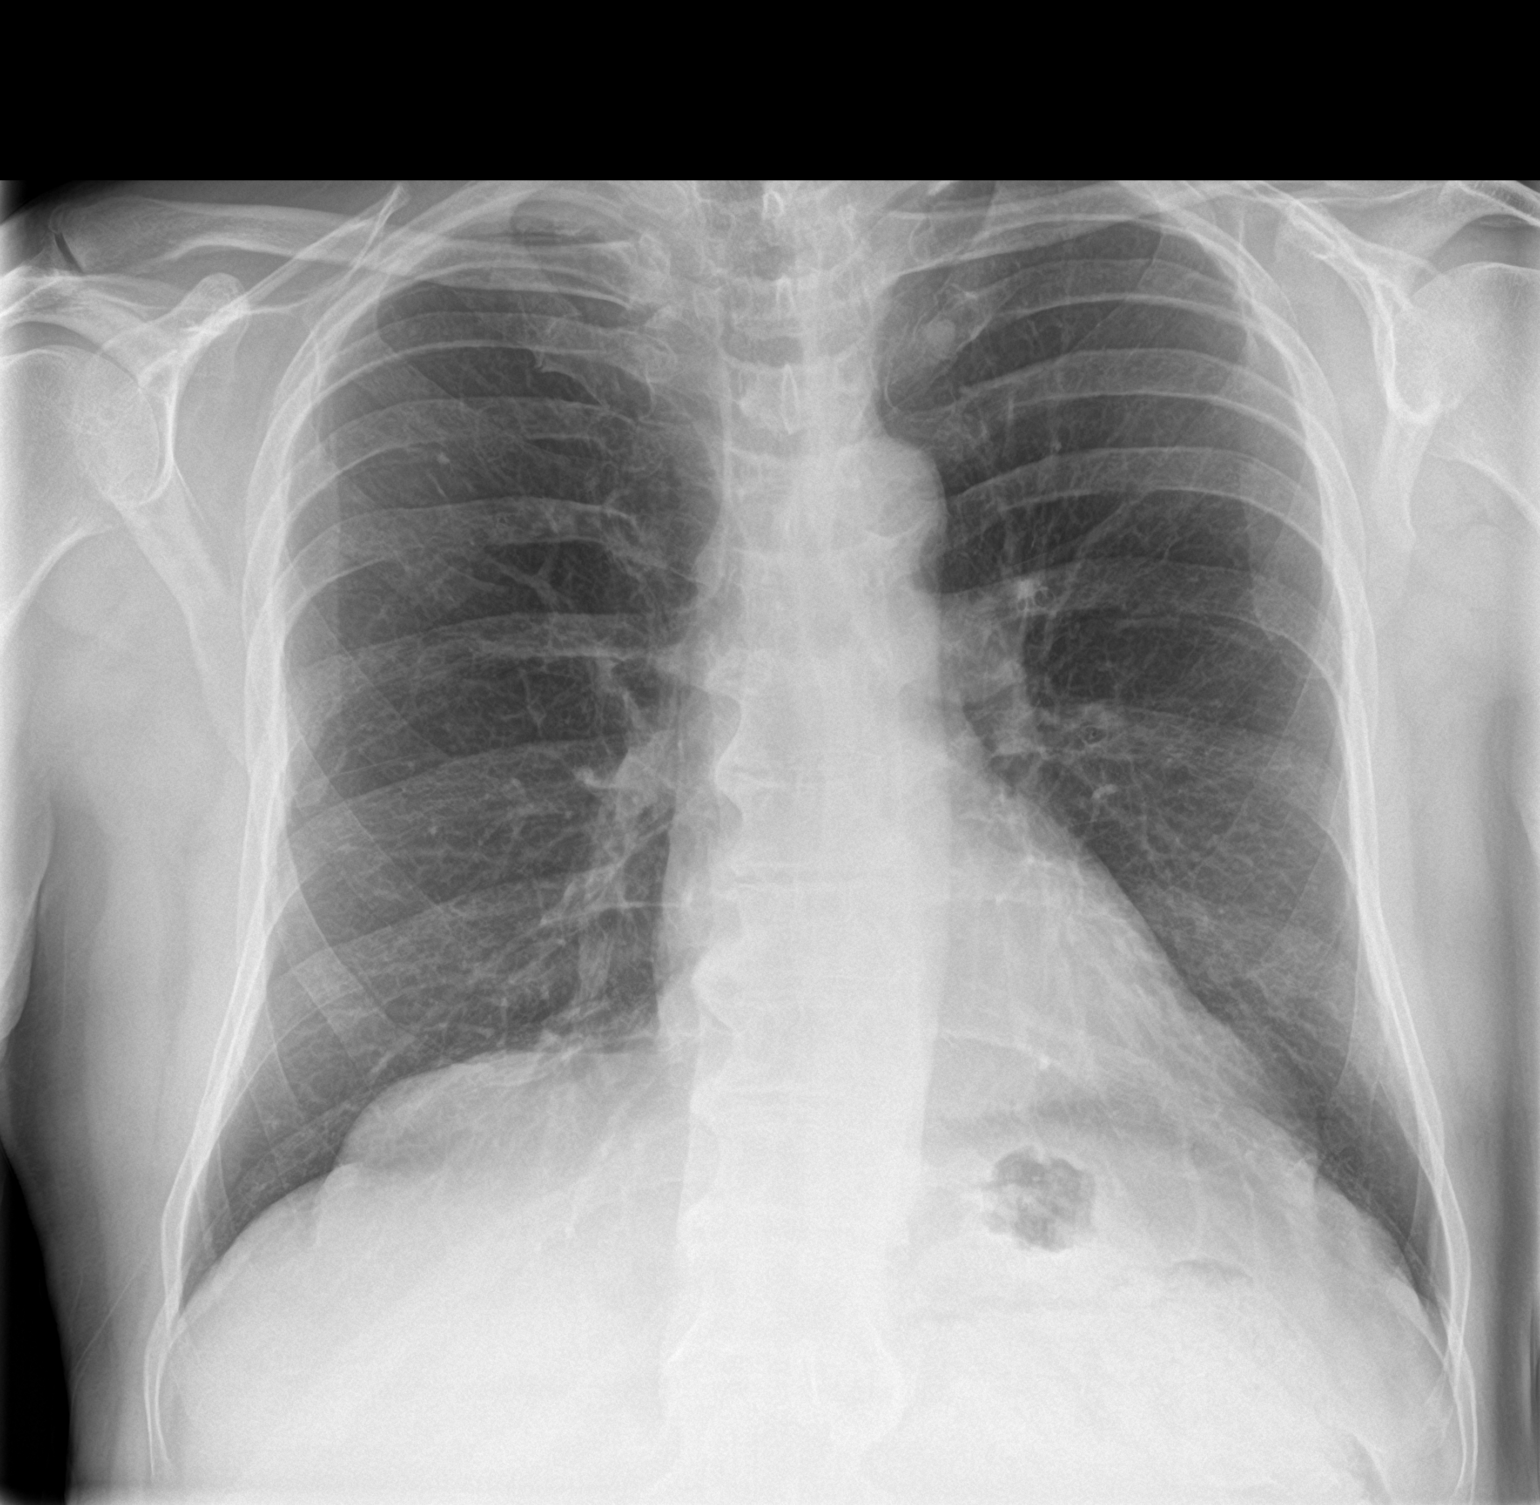

[chest lat]
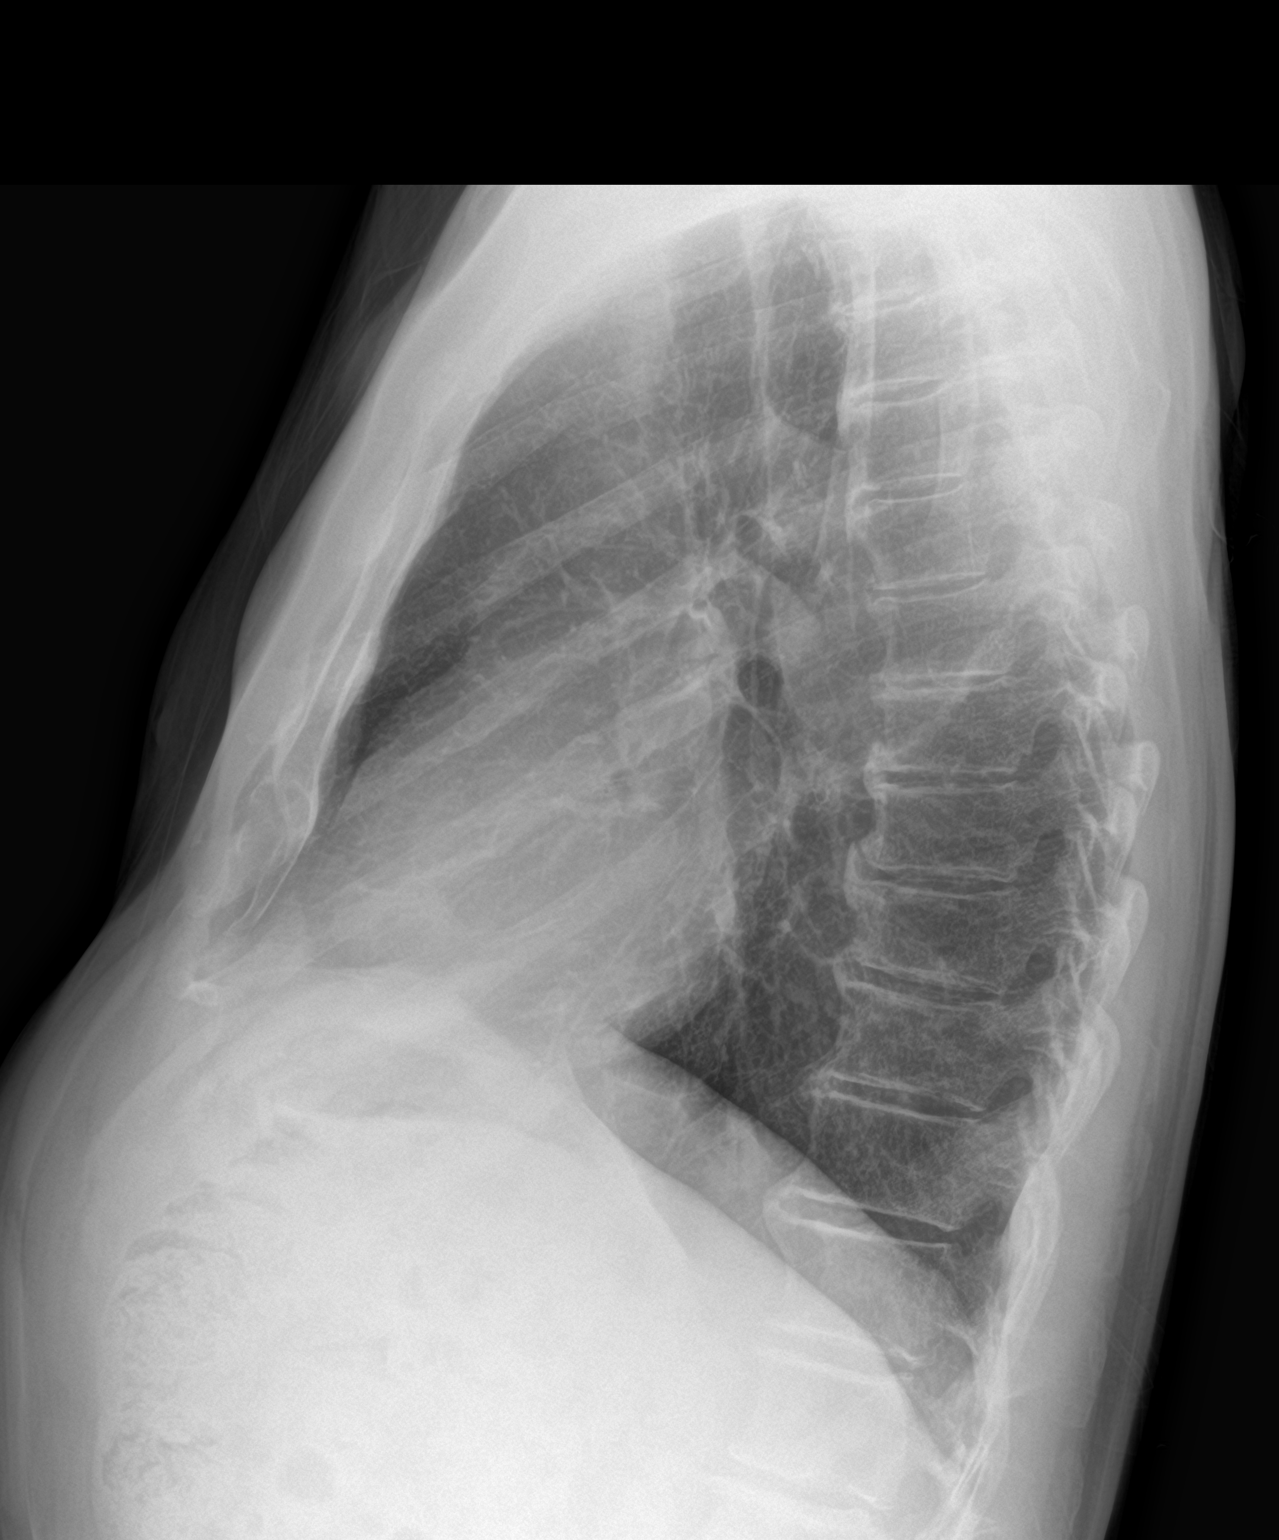

[2 of 2 positions shown; findings below may reference images not displayed]

FINDINGS: Lungs are clear.

Heart size and mediastinal contours are within normal limits. Aortic
Atherosclerosis (1BIHU-170.0).

No effusion.

Anterior vertebral endplate spurring at multiple levels in the mid
and lower thoracic spine.
IMPRESSION: No acute cardiopulmonary disease.

## 2021-11-23 DIAGNOSIS — R351 Nocturia: Secondary | ICD-10-CM | POA: Diagnosis not present

## 2021-11-23 DIAGNOSIS — N401 Enlarged prostate with lower urinary tract symptoms: Secondary | ICD-10-CM | POA: Diagnosis not present

## 2021-12-15 DIAGNOSIS — M25562 Pain in left knee: Secondary | ICD-10-CM | POA: Diagnosis not present

## 2022-01-25 DIAGNOSIS — H6121 Impacted cerumen, right ear: Secondary | ICD-10-CM | POA: Diagnosis not present

## 2022-01-25 DIAGNOSIS — H903 Sensorineural hearing loss, bilateral: Secondary | ICD-10-CM | POA: Insufficient documentation

## 2022-02-23 ENCOUNTER — Other Ambulatory Visit: Payer: Self-pay | Admitting: Cardiology

## 2022-02-25 ENCOUNTER — Other Ambulatory Visit (HOSPITAL_COMMUNITY): Payer: Self-pay

## 2022-03-12 ENCOUNTER — Other Ambulatory Visit: Payer: Self-pay | Admitting: Physician Assistant

## 2022-03-12 DIAGNOSIS — H903 Sensorineural hearing loss, bilateral: Secondary | ICD-10-CM

## 2022-03-31 ENCOUNTER — Telehealth: Payer: Self-pay | Admitting: Internal Medicine

## 2022-03-31 NOTE — Telephone Encounter (Signed)
Left message for patient to call back and schedule Medicare Annual Wellness Visit (AWV).  Please offer to do virtually or by telephone.  Last AWV: 04/06/2021  Please schedule at any time with [facility]-Nurse schedule 2 Courtney  30 minute appointment   Any questions, please contact me at [phone]     Thank you,   [place] [facility] ??[id number] or ??[phone]

## 2022-04-04 ENCOUNTER — Ambulatory Visit
Admission: RE | Admit: 2022-04-04 | Discharge: 2022-04-04 | Disposition: A | Discharge status: Home / Self Care | Payer: Medicare Other | Source: Ambulatory Visit | Attending: Physician Assistant | Admitting: Physician Assistant

## 2022-04-04 DIAGNOSIS — H903 Sensorineural hearing loss, bilateral: Secondary | ICD-10-CM

## 2022-04-04 MED ORDER — GADOPICLENOL 0.5 MMOL/ML IV SOLN
7.5000 mL | Freq: Once | INTRAVENOUS | Status: AC | PRN
  Administered 2022-04-04: 7.5 mL via INTRAVENOUS

## 2022-04-07 ENCOUNTER — Encounter: Payer: Self-pay | Admitting: Cardiology

## 2022-04-07 ENCOUNTER — Ambulatory Visit: Payer: Medicare Other | Attending: Cardiology | Admitting: Cardiology

## 2022-04-07 VITALS — BP 168/70 | HR 65 | Ht 66.0 in | Wt 151.6 lb

## 2022-04-07 DIAGNOSIS — I2510 Atherosclerotic heart disease of native coronary artery without angina pectoris: Secondary | ICD-10-CM

## 2022-04-07 DIAGNOSIS — E7800 Pure hypercholesterolemia, unspecified: Secondary | ICD-10-CM | POA: Diagnosis not present

## 2022-04-07 DIAGNOSIS — I10 Essential (primary) hypertension: Secondary | ICD-10-CM | POA: Diagnosis not present

## 2022-04-07 DIAGNOSIS — I483 Typical atrial flutter: Secondary | ICD-10-CM | POA: Diagnosis not present

## 2022-04-07 MED ORDER — EZETIMIBE 10 MG PO TABS: 10.0000 mg | ORAL_TABLET | Freq: Every day | ORAL | 3 refills | Status: DC

## 2022-04-07 MED ORDER — REPATHA SURECLICK 140 MG/ML ~~LOC~~ SOAJ: SUBCUTANEOUS | 3 refills | Status: DC

## 2022-04-07 NOTE — Patient Instructions (Signed)
Medication Instructions:  Your physician recommends that you continue on your current medications as directed. Please refer to the Current Medication list given to you today.  *If you need a refill on your cardiac medications before your next appointment, please call your pharmacy*   Lab Work: None.  If you have labs (blood work) drawn today and your tests are completely normal, you will receive your results only by: [place] (if you have MyChart) OR A paper copy in the mail If you have any lab test that is abnormal or we need to change your treatment, we will call you to review the results.   Testing/Procedures: None.   Follow-Up: At [facility], you and your health needs are our priority.  As part of our continuing mission to provide you with exceptional heart care, we have created designated Provider Care Teams.  These Care Teams include your primary Cardiologist (physician) and Advanced Practice Providers (APPs -  Physician Assistants and Nurse Practitioners) who all work together to provide you with the care you need, when you need it.  We recommend signing up for the patient portal called "MyChart".  Sign up information is provided on this After Visit Summary.  MyChart is used to connect with patients for Virtual Visits (Telemedicine).  Patients are able to view lab/test results, encounter notes, upcoming appointments, etc.  Non-urgent messages can be sent to your provider as well.   To learn more about what you can do with MyChart, go to [email or web address].    Your next appointment:   1 year(s)  Provider:   Fransico Him, MD     Other Instructions Please check your blood pressure every day around lunch time for one week, then call our clinic to report your blood pressure log.

## 2022-04-07 NOTE — Progress Notes (Signed)
Cardiology Consult  Note    Date:  04/07/2022   ID:  GUERDON ARAGON, DOB 1940-12-07, MRN [id number]  PCP:  Cassandria Anger, MD  Cardiologist:  Fransico Him, MD   Chief Complaint  Patient presents with   Atrial Flutter   Hypertension   Hyperlipidemia   Coronary Artery Disease    History of Present Illness:  [name] Browning is a 82 y.o. male  with a hx of asthma, remote aflutter s/p RFCA 2013, HTN and HLD.  He had a heart cath done in 1990's that was normal but noted to have coronary Ca with a score of 273 on coronary Ca screening as well as aortic atherosclerosis in 11/2018 .   Lexiscan myoview showed no ischemia. 2D echo showed normal LVF.  He is statin intolerant and is now on Repatha and Zetia.   He is here today for followup and is doing well.  He denies any chest pain or pressure, SOB, DOE, PND, orthopnea, LE edema, palpitations or syncope. He has some problems with vertigo at times. He is compliant with his meds and is tolerating meds with no SE.       Past Medical History:  Diagnosis Date   Acute bronchitis 08/11/2016   6/18  H/o asthma as a child...   ADD (attention deficit disorder)    Anemia in chronic illness 06/15/2013   Followed as Primary Care Patient/ [facility] [place] - Fe studies ok 06/15/13 > stool cards received 06/27/2013 > neg x 5> down to 11.4 08/23/13  > referred for hematology 09/04/2013> Alvy Bimler felt it was due to low Testosterone  Resolved as of 08/14/2015         Anxiety    Asthma    AS A CHILD   Ataxia 06/07/2016   2018 poss age related/meds   Atrial flutter ([place])    a. s/p RFCA 07/2011.  Xarelto d/c 08/2011.   Bladder neck obstruction 10/04/2016   Dr Karsten Ro Rapaflo   Cataracts, bilateral    Colon polyps 10/04/2016   Dr Ardis Hughs Due colon 02/2019   Conjunctivitis 09/14/2013   Cough 02/19/2014   DgEs   01/23/16 Unremarkable esophagram. Allergy profile 04/05/2016 >  Eos 0.4 /  IgE  284  RAST pos grass/trees/dust > rec allergy eval    Depression     Diarrhea 02/11/2017   Disturbance in sleep behavior 04/30/2010    Trazodone  Lorazepam prn   DIZZINESS 01/24/2008   Qualifier: Diagnosis of  By: Royal Piedra NP, Tammy     DJD (degenerative joint disease) of knee    Drug-induced memory loss ([place]) 02/22/2014   D/c statin 01/2014 > improved  D/c Trintellix Dr Tish Frederickson   Dyspnea 06/18/2011   Dysrhythmia    hx a-flutter with successful ablation 2013   Essential hypertension 06/18/2011   NAS diet ASA - unable to use due to h/o AVM bleeding - colon 2003   FATIGUE, ACUTE 03/23/2007   Qualifier: Diagnosis of  By: Melvyn Novas MD, Christena Deem    GERD (gastroesophageal reflux disease)    Heart murmur    a. 06/2011 Echo: 65-70%, mild MR   History of lower GI bleeding 09/26/2012   Followed in GI clinic/ [facility]/ [place]  - colonoscopy 05/22/2001  And 06/14/11 > c/w hemorrhoids and cecal avm    Hyperlipidemia    Hyperlipidemia LDL goal <130 03/22/2007   Followed as Primary Care Patient/ [facility]   Zetia    S/P cardiac cath  a. in 1990's - Dr. Gwenlyn Found - WNL   Scalp laceration 11/20/2014   11/01/14 > staples removed 11/18/2014     Sleep apnea    could not tolerate cpap, uses mouth guard now   TESTICULAR HYPOFUNCTION 09/15/2009   F/u urology on testosterone gel      Past Surgical History:  Procedure Laterality Date   ATRIAL FLUTTER ABLATION N/A 08/06/2011   Procedure: ATRIAL FLUTTER ABLATION;  Surgeon: Deboraha Sprang, MD;  Location[facility] CATH LAB;  Service: Cardiovascular;  Laterality: N/A;   COLONOSCOPY  02/27/2016   LESION REMOVAL N/A 08/02/2014   Procedure: EXCISION UPPER LIP VASCULAR ANOMALY ;  Surgeon: Irene Limbo, MD;  Location: [facility];  Service: Plastics;  Laterality: N/A;   MASTOIDECTOMY     POLYPECTOMY     TONSILLECTOMY     TOTAL KNEE ARTHROPLASTY Left 12/19/2020   Procedure: TOTAL KNEE ARTHROPLASTY;  Surgeon: Dorna Leitz, MD;  Location: [facility];  Service: Orthopedics;  Laterality: Left;   WRIST FRACTURE SURGERY  2007    left    Current Medications: Current Meds  Medication Sig   Cholecalciferol (VITAMIN D3) 2000 units capsule Take 1 capsule (2,000 Units total) by mouth daily.   cyanocobalamin 1000 MCG tablet Take 1,000 mcg by mouth daily.   ezetimibe (ZETIA) 10 MG tablet Take 1 tablet (10 mg total) by mouth daily.   Multiple Vitamin tablet Take 1 tablet by mouth daily.   Multiple Vitamins-Minerals (ZINC PO) Take 1 tablet by mouth daily.   RAPAFLO 8 MG CAPS capsule Take 8 mg by mouth daily with breakfast.    REPATHA SURECLICK XX123456 MG/ML SOAJ INJECT 140MG SUBCUTANEOUSLY  EVERY 2 WEEKS    Allergies:   Crestor [rosuvastatin], Trintellix [vortioxetine], and Zocor [simvastatin]   Social History   Socioeconomic History   Marital status: Married    Spouse name: Not on file   Number of children: Not on file   Years of education: Not on file   Highest education level: Not on file  Occupational History   Occupation: RetiredWater quality scientist for [employer]  Tobacco Use   Smoking status: Former    Packs/day: 3.00    Years: 7.00    Total pack years: 21.00    Types: Cigarettes    Start date: 1963    Quit date: 02/16/1968    Years since quitting: 54.1   Smokeless tobacco: Never  Vaping Use   Vaping Use: Never used  Substance and Sexual Activity   Alcohol use: Not Currently   Drug use: No   Sexual activity: Yes  Other Topics Concern   Not on file  Social History Narrative   Not on file   Social Determinants of Health   Financial Resource Strain: Low Risk  (04/06/2021)   Overall Financial Resource Strain (CARDIA)    Difficulty of Paying Living Expenses: Not hard at all  Food Insecurity: No Food Insecurity (04/06/2021)   Hunger Vital Sign    Worried About Running Out of Food in the Last Year: Never true    Ran Out of Food in the Last Year: Never true  Transportation Needs: No Transportation Needs (04/06/2021)   PRAPARE - Hydrologist (Medical): No    Lack of  Transportation (Non-Medical): No  Physical Activity: Insufficiently Active (04/06/2021)   Exercise Vital Sign    Days of Exercise per Week: 3 days    Minutes of Exercise per Session: 30 min  Stress: No Stress  Concern Present (04/06/2021)   [place]    Feeling of Stress : Not at all  Social Connections: Moderately Integrated (04/06/2021)   Social Connection and Isolation Panel [NHANES]    Frequency of Communication with Friends and Family: Twice a week    Frequency of Social Gatherings with Friends and Family: Twice a week    Attends Religious Services: Never    Marine scientist or Organizations: Yes    Attends Music therapist: More than 4 times per year    Marital Status: Married     Family History:  The patient's family history includes Heart disease in his mother; Hypertension in his brother; Leukemia in his mother; Prostate cancer in his brother and father; Stroke in his father.   ROS:   Please see the history of present illness.    ROS All other systems reviewed and are negative.      No data to display             PHYSICAL EXAM:   VS:  BP (!) 168/70   Pulse 65   Ht 5' 6"$  (1.676 m)   Wt 151 lb 9.6 oz (68.8 kg)   SpO2 96%   BMI 24.47 kg/m     GEN: Well nourished, well developed in no acute distress HEENT: Normal NECK: No JVD; No carotid bruits LYMPHATICS: No lymphadenopathy CARDIAC:RRR, no murmurs, rubs, gallops RESPIRATORY:  Clear to auscultation without rales, wheezing or rhonchi  ABDOMEN: Soft, non-tender, non-distended MUSCULOSKELETAL:  No edema; No deformity  SKIN: Warm and dry NEUROLOGIC:  Alert and oriented x 3 PSYCHIATRIC:  Normal affect  Wt Readings from Last 3 Encounters:  04/07/22 151 lb 9.6 oz (68.8 kg)  11/02/21 147 lb 12.8 oz (67 kg)  03/16/21 146 lb (66.2 kg)      Studies/Labs Reviewed:   EKG:  EKG is ordered today and demonstrates NSR with nonspecific  ST abnormality  Recent Labs: 11/02/2021: ALT 16; BUN 22; Creatinine, Ser 1.17; Hemoglobin 13.1; Platelets 305.0; Potassium 4.6; Sodium 142; TSH 5.06   Lipid Panel    Component Value Date/Time   CHOL 121 11/02/2021 1151   CHOL 110 04/04/2019 0816   TRIG 156.0 (H) 11/02/2021 1151   HDL 65.70 11/02/2021 1151   HDL 63 04/04/2019 0816   CHOLHDL 2 11/02/2021 1151   VLDL 31.2 11/02/2021 1151   LDLCALC 24 11/02/2021 1151   LDLCALC 23 10/26/2019 1221   LDLDIRECT 152.0 10/12/2018 1554    Additional studies/ records that were reviewed today include:  Office notes from PCP    ASSESSMENT:    1. Coronary artery calcification seen on CAT scan   2. Essential hypertension   3. Pure hypercholesterolemia   4. Typical atrial flutter (HCC)        PLAN:  In order of problems listed above:  1.  Coronary artery calcifications -coronary Ca score was elevated at 273 which was 48th% for age and sex matched controls.   -lexiscan myoview showed no ischemia 01/2019 -He denies any and all symptoms -he has been intolerant to statins and is on Praluent -needs aggressive risk factor modification -continue ASA 59m daily and statin  2.  HTN -BP is elevated exam today>>suspect he has white coat HTN -I have asked him to check his blood pressure daily for a week and call with results  3.  HLD -LDL goal < 70 -he did not tolerate simvastatin due to memory issues -I have  personally reviewed and interpreted outside labs performed by patient's PCP which showed LDL 24 and HDL 65 on 11/02/2021 ALT was normal at 16. -Continue prescription drug management with Repatha and Zetia 10 mg daily with as needed refills  4.  PAflutter -s/p remote ablation 2013 -He is maintaining normal rhythm and denies any palpitations    Medication Adjustments/Labs and Tests Ordered: Current medicines are reviewed at length with the patient today.  Concerns regarding medicines are outlined above.  Medication changes, Labs  and Tests ordered today are listed in the Patient Instructions below.  There are no Patient Instructions on file for this visit.   Signed, Fransico Him, MD  04/07/2022 11:21 AM    [address], [place], [place]  [address] Phone: [phone]; Fax: [phone]

## 2022-04-07 NOTE — Addendum Note (Signed)
Addended by: Joni Reining on: 04/07/2022 11:32 AM   Modules accepted: Orders

## 2022-04-13 ENCOUNTER — Telehealth: Payer: Self-pay

## 2022-04-13 DIAGNOSIS — E7800 Pure hypercholesterolemia, unspecified: Secondary | ICD-10-CM

## 2022-04-13 MED ORDER — EZETIMIBE 10 MG PO TABS: 10.0000 mg | ORAL_TABLET | Freq: Every day | ORAL | 3 refills | Status: DC

## 2022-04-13 NOTE — Telephone Encounter (Signed)
Refill for Zetia 10 mg sent to pharmacy per Dr Radford Pax.   Recorded blood pressures: 2/22  133/57 2/23   128/67  2/24   134/67 2/25   141/65 2/26   142/73 2/27    [id number]

## 2022-05-03 ENCOUNTER — Encounter: Payer: Self-pay | Admitting: Internal Medicine

## 2022-05-03 ENCOUNTER — Ambulatory Visit (INDEPENDENT_AMBULATORY_CARE_PROVIDER_SITE_OTHER): Payer: Medicare Other | Admitting: Internal Medicine

## 2022-05-03 VITALS — BP 130/78 | HR 62 | Temp 97.7°F | Ht 66.0 in | Wt 150.0 lb

## 2022-05-03 DIAGNOSIS — I10 Essential (primary) hypertension: Secondary | ICD-10-CM | POA: Diagnosis not present

## 2022-05-03 DIAGNOSIS — F32A Depression, unspecified: Secondary | ICD-10-CM | POA: Diagnosis not present

## 2022-05-03 DIAGNOSIS — E7800 Pure hypercholesterolemia, unspecified: Secondary | ICD-10-CM | POA: Diagnosis not present

## 2022-05-03 DIAGNOSIS — D638 Anemia in other chronic diseases classified elsewhere: Secondary | ICD-10-CM | POA: Diagnosis not present

## 2022-05-03 DIAGNOSIS — N320 Bladder-neck obstruction: Secondary | ICD-10-CM

## 2022-05-03 LAB — CBC WITH DIFFERENTIAL/PLATELET
Basophils Absolute: 0 10*3/uL (ref 0.0–0.1)
Basophils Relative: 0.6 % (ref 0.0–3.0)
Eosinophils Absolute: 0.1 10*3/uL (ref 0.0–0.7)
Eosinophils Relative: 2.2 % (ref 0.0–5.0)
HCT: 38.4 % — ABNORMAL LOW (ref 39.0–52.0)
Hemoglobin: 13.1 g/dL (ref 13.0–17.0)
Lymphocytes Relative: 28.6 % (ref 12.0–46.0)
Lymphs Abs: 1.5 10*3/uL (ref 0.7–4.0)
MCHC: 34.1 g/dL (ref 30.0–36.0)
MCV: 91.8 fl (ref 78.0–100.0)
Monocytes Absolute: 0.5 10*3/uL (ref 0.1–1.0)
Monocytes Relative: 10.2 % (ref 3.0–12.0)
Neutro Abs: 3 10*3/uL (ref 1.4–7.7)
Neutrophils Relative %: 58.4 % (ref 43.0–77.0)
Platelets: 307 10*3/uL (ref 150.0–400.0)
RBC: 4.18 Mil/uL — ABNORMAL LOW (ref 4.22–5.81)
RDW: 13.3 % (ref 11.5–15.5)
WBC: 5.1 10*3/uL (ref 4.0–10.5)

## 2022-05-03 LAB — LIPID PANEL
Cholesterol: 119 mg/dL (ref 0–200)
HDL: 66.3 mg/dL (ref >39.00)
LDL Cholesterol: 24 mg/dL (ref 0–99)
NonHDL: 52.58
Total CHOL/HDL Ratio: 2
Triglycerides: 143 mg/dL (ref 0.0–149.0)
VLDL: 28.6 mg/dL (ref 0.0–40.0)

## 2022-05-03 LAB — COMPREHENSIVE METABOLIC PANEL
ALT: 15 U/L (ref 0–53)
AST: 19 U/L (ref 0–37)
Albumin: 4.4 g/dL (ref 3.5–5.2)
Alkaline Phosphatase: 88 U/L (ref 39–117)
BUN: 21 mg/dL (ref 6–23)
CO2: 29 mEq/L (ref 19–32)
Calcium: 10 mg/dL (ref 8.4–10.5)
Chloride: 104 mEq/L (ref 96–112)
Creatinine, Ser: 1.11 mg/dL (ref 0.40–1.50)
GFR: 62.41 mL/min (ref >60.00)
Glucose, Bld: 98 mg/dL (ref 70–99)
Potassium: 4.7 mEq/L (ref 3.5–5.1)
Sodium: 141 mEq/L (ref 135–145)
Total Bilirubin: 0.4 mg/dL (ref 0.2–1.2)
Total Protein: 7.3 g/dL (ref 6.0–8.3)

## 2022-05-03 MED ORDER — EZETIMIBE 10 MG PO TABS: 10.0000 mg | ORAL_TABLET | Freq: Every day | ORAL | 3 refills | Status: DC

## 2022-05-03 NOTE — Assessment & Plan Note (Signed)
NAS diet 

## 2022-05-03 NOTE — Assessment & Plan Note (Signed)
Cont on Rapaflo

## 2022-05-03 NOTE — Assessment & Plan Note (Signed)
Doing well 

## 2022-05-03 NOTE — Patient Instructions (Signed)
Blue-Emu cream -- use 2-3 times a day ? ?

## 2022-05-03 NOTE — Progress Notes (Signed)
Subjective:  Patient ID: [name] Browning, male    DOB: 29-Aug-1940  Age: 82 y.o. MRN: [id number]  CC: No chief complaint on file.   HPI [name] Browning presents for Dyslipidemia, BPH, B12 def  Outpatient Medications Prior to Visit  Medication Sig Dispense Refill   Cholecalciferol (VITAMIN D3) 2000 units capsule Take 1 capsule (2,000 Units total) by mouth daily. 100 capsule 3   cyanocobalamin 1000 MCG tablet Take 1,000 mcg by mouth daily.     Evolocumab (REPATHA SURECLICK) [id number] MG/ML SOAJ INJECT 140MG  SUBCUTANEOUSLY  EVERY 2 WEEKS 6 mL 3   Multiple Vitamin tablet Take 1 tablet by mouth daily.     Multiple Vitamins-Minerals (ZINC PO) Take 1 tablet by mouth daily.     RAPAFLO 8 MG CAPS capsule Take 8 mg by mouth daily with breakfast.      ezetimibe (ZETIA) 10 MG tablet Take 1 tablet (10 mg total) by mouth daily. 90 tablet 3   No facility-administered medications prior to visit.    ROS: Review of Systems  Constitutional:  Negative for appetite change, fatigue and unexpected weight change.  HENT:  Negative for congestion, nosebleeds, sneezing, sore throat and trouble swallowing.   Eyes:  Negative for itching and visual disturbance.  Respiratory:  Negative for cough.   Cardiovascular:  Negative for chest pain, palpitations and leg swelling.  Gastrointestinal:  Negative for abdominal distention, blood in stool, diarrhea and nausea.  Genitourinary:  Negative for frequency and hematuria.  Musculoskeletal:  Negative for back pain, gait problem, joint swelling and neck pain.  Skin:  Negative for rash.  Neurological:  Negative for dizziness, tremors, speech difficulty and weakness.  Psychiatric/Behavioral:  Negative for agitation, dysphoric mood and sleep disturbance. The patient is not nervous/anxious.     Objective:  BP 130/78 (BP Location: Left Arm, Patient Position: Sitting, Cuff Size: Normal)   Pulse 62   Temp 97.7 F (36.5 C) (Oral)   Ht 5\' 6"  (1.676 m)   Wt 150 lb (68 kg)    SpO2 97%   BMI 24.21 kg/m   BP Readings from Last 3 Encounters:  05/03/22 130/78  04/07/22 (!) 168/70  11/02/21 130/82    Wt Readings from Last 3 Encounters:  05/03/22 150 lb (68 kg)  04/07/22 151 lb 9.6 oz (68.8 kg)  11/02/21 147 lb 12.8 oz (67 kg)    Physical Exam Constitutional:      General: He is not in acute distress.    Appearance: Normal appearance. He is well-developed.     Comments: NAD  Eyes:     Conjunctiva/sclera: Conjunctivae normal.     Pupils: Pupils are equal, round, and reactive to light.  Neck:     Thyroid: No thyromegaly.     Vascular: No JVD.  Cardiovascular:     Rate and Rhythm: Normal rate and regular rhythm.     Heart sounds: Normal heart sounds. No murmur heard.    No friction rub. No gallop.  Pulmonary:     Effort: Pulmonary effort is normal. No respiratory distress.     Breath sounds: Normal breath sounds. No wheezing or rales.  Chest:     Chest wall: No tenderness.  Abdominal:     General: Bowel sounds are normal. There is no distension.     Palpations: Abdomen is soft. There is no mass.     Tenderness: There is no abdominal tenderness. There is no guarding or rebound.  Musculoskeletal:        General:  No tenderness. Normal range of motion.     Cervical back: Normal range of motion.  Lymphadenopathy:     Cervical: No cervical adenopathy.  Skin:    General: Skin is warm and dry.     Findings: No rash.  Neurological:     Mental Status: He is alert and oriented to person, place, and time.     Cranial Nerves: No cranial nerve deficit.     Motor: No abnormal muscle tone.     Coordination: Coordination normal.     Gait: Gait normal.     Deep Tendon Reflexes: Reflexes are normal and symmetric.  Psychiatric:        Behavior: Behavior normal.        Thought Content: Thought content normal.        Judgment: Judgment normal.     Lab Results  Component Value Date   WBC 5.4 11/02/2021   HGB 13.1 11/02/2021   HCT 38.6 (L) 11/02/2021    PLT 305.0 11/02/2021   GLUCOSE 96 11/02/2021   CHOL 121 11/02/2021   TRIG 156.0 (H) 11/02/2021   HDL 65.70 11/02/2021   LDLDIRECT 152.0 10/12/2018   LDLCALC 24 11/02/2021   ALT 16 11/02/2021   AST 17 11/02/2021   NA 142 11/02/2021   K 4.6 11/02/2021   CL 104 11/02/2021   CREATININE 1.17 11/02/2021   BUN 22 11/02/2021   CO2 27 11/02/2021   TSH 5.06 11/02/2021   PSA 0.68 11/02/2021   INR 0.9 12/10/2020   HGBA1C 5.8 (H) 05/19/2016    MR BRAIN/IAC W WO CONTRAST  Result Date: 04/05/2022 CLINICAL DATA:  Provided history: Sensory hearing loss, bilateral. EXAM: MRI HEAD WITHOUT AND WITH CONTRAST TECHNIQUE: Multiplanar, multiecho pulse sequences of the brain and surrounding structures were obtained without and with intravenous contrast. CONTRAST:  7.5 mL Vueway intravenous contrast. COMPARISON:  Brain MRI 06/01/2016. FINDINGS: Brain: Mild generalized parenchymal atrophy. Mild multifocal T2 FLAIR hyperintense signal abnormality within the cerebral white matter and pons, nonspecific but compatible with chronic small vessel ischemic disease. No evidence of an intracranial mass. Specifically, no cerebellopontine angle or internal auditory canal mass is demonstrated. Unremarkable appearance of the 7th and 8th cranial nerves bilaterally. There is no acute infarct. No chronic intracranial blood products. No extra-axial fluid collection. No midline shift. No pathologic intracranial enhancement identified. Vascular: Maintained flow voids within the proximal large arterial vessels. Skull and upper cervical spine: No focal suspicious marrow lesion. Incompletely assessed cervical spondylosis. Ligamentous hypertrophy/pannus formation posterior to the dens, mildly narrowing the upper cervical spinal canal. Sinuses/Orbits: No orbital mass or acute orbital finding. Prior bilateral ocular lens replacement. Trace mucosal thickening within the bilateral ethmoid and maxillary sinuses. Other: Small-volume fluid within the  left mastoid air cells. IMPRESSION: 1.  No evidence of an acute intracranial abnormality. 2. No cerebellopontine angle or internal auditory canal mass. 3. No specific cause of hearing loss is identified. 4. Mild chronic small vessel ischemic changes within the cerebral white matter and pons, similar to the prior brain MRI of 06/01/2016. 5. Mild generalized parenchymal atrophy. 6. Small-volume fluid within the left mastoid air cells. Electronically Signed   By: Kellie Simmering D.O.   On: 04/05/2022 18:16    Assessment & Plan:   Problem List Items Addressed This Visit       Cardiovascular and Mediastinum   Essential hypertension - Primary    NAS diet      Relevant Medications   ezetimibe (ZETIA) 10 MG tablet  Other Relevant Orders   CBC with Differential/Platelet   Comprehensive metabolic panel   Lipid panel     Genitourinary   Bladder neck obstruction    Cont on Rapaflo      Relevant Orders   CBC with Differential/Platelet   Comprehensive metabolic panel   Lipid panel     Other   Hyperlipidemia   Relevant Medications   ezetimibe (ZETIA) 10 MG tablet   Other Relevant Orders   Lipid panel   Depression    Doing well      Anemia in chronic illness   Relevant Orders   CBC with Differential/Platelet   Iron, TIBC and Ferritin Panel      Meds ordered this encounter  Medications   ezetimibe (ZETIA) 10 MG tablet    Sig: Take 1 tablet (10 mg total) by mouth daily.    Dispense:  90 tablet    Refill:  3    Requesting 1 year supply      Follow-up: Return in about 6 months (around 11/03/2022) for Wellness Exam.  Walker Kehr, MD

## 2022-05-04 LAB — IRON,TIBC AND FERRITIN PANEL
%SAT: 28 % (calc) (ref 20–48)
Ferritin: 69 ng/mL (ref 24–380)
Iron: 95 ug/dL (ref 50–180)
TIBC: 343 mcg/dL (calc) (ref 250–425)

## 2022-06-03 DIAGNOSIS — Z23 Encounter for immunization: Secondary | ICD-10-CM | POA: Diagnosis not present

## 2022-06-14 DIAGNOSIS — L820 Inflamed seborrheic keratosis: Secondary | ICD-10-CM | POA: Diagnosis not present

## 2022-06-14 DIAGNOSIS — Z85828 Personal history of other malignant neoplasm of skin: Secondary | ICD-10-CM | POA: Diagnosis not present

## 2022-07-14 DIAGNOSIS — Z961 Presence of intraocular lens: Secondary | ICD-10-CM | POA: Diagnosis not present

## 2022-07-29 ENCOUNTER — Ambulatory Visit (INDEPENDENT_AMBULATORY_CARE_PROVIDER_SITE_OTHER): Payer: Medicare Other

## 2022-07-29 VITALS — Ht 66.0 in | Wt 145.0 lb

## 2022-07-29 DIAGNOSIS — Z0000 Encounter for general adult medical examination without abnormal findings: Secondary | ICD-10-CM | POA: Diagnosis not present

## 2022-07-29 NOTE — Patient Instructions (Signed)
Mr. [name] Browning , Thank you for taking time to come for your Medicare Wellness Visit. I appreciate your ongoing commitment to your health goals. Please review the following plan we discussed and let me know if I can assist you in the future.   These are the goals we discussed:  Goals      Patient Stated     Continue to enjoy reading, exercise, and eat healthy. Enjoy life and travel.     Patient Stated     07/29/2022, remain mobile        This is a list of the screening recommended for you and due dates:  Health Maintenance  Topic Date Due   DTaP/Tdap/Td vaccine (2 - Td or Tdap) 06/07/2021   Flu Shot  09/16/2022   Medicare Annual Wellness Visit  07/29/2023   Pneumonia Vaccine  Completed   COVID-19 Vaccine  Completed   Zoster (Shingles) Vaccine  Completed   HPV Vaccine  Aged Out    Advanced directives: copy in chart  Conditions/risks identified: none  Next appointment: Follow up in one year for your annual wellness visit.   Preventive Care 75 Years and Older, Male  Preventive care refers to lifestyle choices and visits with your health care provider that can promote health and wellness. What does preventive care include? A yearly physical exam. This is also called an annual well check. Dental exams once or twice a year. Routine eye exams. Ask your health care provider how often you should have your eyes checked. Personal lifestyle choices, including: Daily care of your teeth and gums. Regular physical activity. Eating a healthy diet. Avoiding tobacco and drug use. Limiting alcohol use. Practicing safe sex. Taking low doses of aspirin every day. Taking vitamin and mineral supplements as recommended by your health care provider. What happens during an annual well check? The services and screenings done by your health care provider during your annual well check will depend on your age, overall health, lifestyle risk factors, and family history of disease. Counseling  Your  health care provider may ask you questions about your: Alcohol use. Tobacco use. Drug use. Emotional well-being. Home and relationship well-being. Sexual activity. Eating habits. History of falls. Memory and ability to understand (cognition). Work and work Astronomer. Screening  You may have the following tests or measurements: Height, weight, and BMI. Blood pressure. Lipid and cholesterol levels. These may be checked every 5 years, or more frequently if you are over 21 years old. Skin check. Lung cancer screening. You may have this screening every year starting at age 85 if you have a 30-pack-year history of smoking and currently smoke or have quit within the past 15 years. Fecal occult blood test (FOBT) of the stool. You may have this test every year starting at age 72. Flexible sigmoidoscopy or colonoscopy. You may have a sigmoidoscopy every 5 years or a colonoscopy every 10 years starting at age 5. Prostate cancer screening. Recommendations will vary depending on your family history and other risks. Hepatitis C blood test. Hepatitis B blood test. Sexually transmitted disease (STD) testing. Diabetes screening. This is done by checking your blood sugar (glucose) after you have not eaten for a while (fasting). You may have this done every 1-3 years. Abdominal aortic aneurysm (AAA) screening. You may need this if you are a current or former smoker. Osteoporosis. You may be screened starting at age 16 if you are at high risk. Talk with your health care provider about your test results, treatment options, and  if necessary, the need for more tests. Vaccines  Your health care provider may recommend certain vaccines, such as: Influenza vaccine. This is recommended every year. Tetanus, diphtheria, and acellular pertussis (Tdap, Td) vaccine. You may need a Td booster every 10 years. Zoster vaccine. You may need this after age 22. Pneumococcal 13-valent conjugate (PCV13) vaccine. One dose  is recommended after age 16. Pneumococcal polysaccharide (PPSV23) vaccine. One dose is recommended after age 61. Talk to your health care provider about which screenings and vaccines you need and how often you need them. This information is not intended to replace advice given to you by your health care provider. Make sure you discuss any questions you have with your health care provider. Document Released: 02/28/2015 Document Revised: 10/22/2015 Document Reviewed: 12/03/2014 Elsevier Interactive Patient Education  2017 [employer] Prevention in the Home Falls can cause injuries. They can happen to people of all ages. There are many things you can do to make your home safe and to help prevent falls. What can I do on the outside of my home? Regularly fix the edges of walkways and driveways and fix any cracks. Remove anything that might make you trip as you walk through a door, such as a raised step or threshold. Trim any bushes or trees on the path to your home. Use bright outdoor lighting. Clear any walking paths of anything that might make someone trip, such as rocks or tools. Regularly check to see if handrails are loose or broken. Make sure that both sides of any steps have handrails. Any raised decks and porches should have guardrails on the edges. Have any leaves, snow, or ice cleared regularly. Use sand or salt on walking paths during winter. Clean up any spills in your garage right away. This includes oil or grease spills. What can I do in the bathroom? Use night lights. Install grab bars by the toilet and in the tub and shower. Do not use towel bars as grab bars. Use non-skid mats or decals in the tub or shower. If you need to sit down in the shower, use a plastic, non-slip stool. Keep the floor dry. Clean up any water that spills on the floor as soon as it happens. Remove soap buildup in the tub or shower regularly. Attach bath mats securely with double-sided non-slip rug  tape. Do not have throw rugs and other things on the floor that can make you trip. What can I do in the bedroom? Use night lights. Make sure that you have a light by your bed that is easy to reach. Do not use any sheets or blankets that are too big for your bed. They should not hang down onto the floor. Have a firm chair that has side arms. You can use this for support while you get dressed. Do not have throw rugs and other things on the floor that can make you trip. What can I do in the kitchen? Clean up any spills right away. Avoid walking on wet floors. Keep items that you use a lot in easy-to-reach places. If you need to reach something above you, use a strong step stool that has a grab bar. Keep electrical cords out of the way. Do not use floor polish or wax that makes floors slippery. If you must use wax, use non-skid floor wax. Do not have throw rugs and other things on the floor that can make you trip. What can I do with my stairs? Do not leave any  items on the stairs. Make sure that there are handrails on both sides of the stairs and use them. Fix handrails that are broken or loose. Make sure that handrails are as long as the stairways. Check any carpeting to make sure that it is firmly attached to the stairs. Fix any carpet that is loose or worn. Avoid having throw rugs at the top or bottom of the stairs. If you do have throw rugs, attach them to the floor with carpet tape. Make sure that you have a light switch at the top of the stairs and the bottom of the stairs. If you do not have them, ask someone to add them for you. What else can I do to help prevent falls? Wear shoes that: Do not have high heels. Have rubber bottoms. Are comfortable and fit you well. Are closed at the toe. Do not wear sandals. If you use a stepladder: Make sure that it is fully opened. Do not climb a closed stepladder. Make sure that both sides of the stepladder are locked into place. Ask someone to  hold it for you, if possible. Clearly mark and make sure that you can see: Any grab bars or handrails. First and last steps. Where the edge of each step is. Use tools that help you move around (mobility aids) if they are needed. These include: Canes. Walkers. Scooters. Crutches. Turn on the lights when you go into a dark area. Replace any light bulbs as soon as they burn out. Set up your furniture so you have a clear path. Avoid moving your furniture around. If any of your floors are uneven, fix them. If there are any pets around you, be aware of where they are. Review your medicines with your doctor. Some medicines can make you feel dizzy. This can increase your chance of falling. Ask your doctor what other things that you can do to help prevent falls. This information is not intended to replace advice given to you by your health care provider. Make sure you discuss any questions you have with your health care provider. Document Released: 11/28/2008 Document Revised: 07/10/2015 Document Reviewed: 03/08/2014 Elsevier Interactive Patient Education  2017 [employer].

## 2022-07-29 NOTE — Progress Notes (Cosign Needed Addendum)
I connected with  [name] Browning on 07/29/22 by a audio enabled telemedicine application and verified that I am speaking with the correct person using two identifiers. Spouse was also on call.  Patient Location: Home  Provider Location: Office/Clinic  I discussed the limitations of evaluation and management by telemedicine. The patient expressed understanding and agreed to proceed. Subjective:   [name] Browning is a 82 y.o. male who presents for Medicare Annual/Subsequent preventive examination.  Review of Systems     Cardiac Risk Factors include: advanced age (>44men, >75 women);dyslipidemia;hypertension;male gender     Objective:    Today's Vitals   07/29/22 1526  Weight: 145 lb (65.8 kg)  Height: 5\' 6"  (1.676 m)   Body mass index is 23.4 kg/m.     07/29/2022    3:30 PM 04/06/2021    1:18 PM 12/19/2020   11:37 AM 12/10/2020   10:44 AM 11/13/2019   10:52 AM 07/12/2018   10:14 AM 05/25/2017   11:34 AM  Advanced Directives  Does Patient Have a Medical Advance Directive? Yes Yes Yes Yes Yes Yes Yes  Type of Estate agent of [place];Living will Healthcare Power of [place];Living will Living will;Healthcare Power of Attorney Living will;Healthcare Power of [employer] Power of [place];Living will Healthcare Power of [place];Living will Healthcare Power of [place];Living will  Does patient want to make changes to medical advance directive?   No - Patient declined  No - Patient declined    Copy of Healthcare Power of Attorney in Chart? Yes - validated most recent copy scanned in chart (See row information) No - copy requested No - copy requested  No - copy requested No - copy requested No - copy requested    Current Medications (verified) Outpatient Encounter Medications as of 07/29/2022  Medication Sig   Cholecalciferol (VITAMIN D3) 2000 units capsule Take 1 capsule (2,000 Units total) by mouth daily.   cyanocobalamin 1000 MCG tablet Take  1,000 mcg by mouth daily.   Evolocumab (REPATHA SURECLICK) 140 MG/ML SOAJ INJECT 140MG  SUBCUTANEOUSLY  EVERY 2 WEEKS   ezetimibe (ZETIA) 10 MG tablet Take 1 tablet (10 mg total) by mouth daily.   Multiple Vitamin tablet Take 1 tablet by mouth daily.   Multiple Vitamins-Minerals (ZINC PO) Take 1 tablet by mouth daily.   RAPAFLO 8 MG CAPS capsule Take 8 mg by mouth daily with breakfast.    No facility-administered encounter medications on file as of 07/29/2022.    Allergies (verified) Crestor [rosuvastatin], Trintellix [vortioxetine], and Zocor [simvastatin]   History: Past Medical History:  Diagnosis Date   Acute bronchitis 08/11/2016   6/18  H/o asthma as a child...   ADD (attention deficit disorder)    Anemia in chronic illness 06/15/2013   Followed as Primary Care Patient/ [facility]/ [place] - Fe studies ok 06/15/13 > stool cards received 06/27/2013 > neg x 5> down to 11.4 08/23/13  > referred for hematology 09/04/2013> Bertis Ruddy felt it was due to low Testosterone  Resolved as of 08/14/2015         Anxiety    Asthma    AS A CHILD   Ataxia 06/07/2016   2018 poss age related/meds   Atrial flutter (HCC)    a. s/p RFCA 07/2011.  Xarelto d/c 08/2011.   Bladder neck obstruction 10/04/2016   Dr Vernie Ammons Rapaflo   Cataracts, bilateral    Colon polyps 10/04/2016   Dr Christella Hartigan Due colon 02/2019   Conjunctivitis 09/14/2013   Cough 02/19/2014  DgEs   01/23/16 Unremarkable esophagram. Allergy profile 04/05/2016 >  Eos 0.4 /  IgE  284  RAST pos grass/trees/dust > rec allergy eval    Depression    Diarrhea 02/11/2017   Disturbance in sleep behavior 04/30/2010    Trazodone  Lorazepam prn   DIZZINESS 01/24/2008   Qualifier: Diagnosis of  By: Clent Ridges NP, Tammy     DJD (degenerative joint disease) of knee    Drug-induced memory loss (HCC) 02/22/2014   D/c statin 01/2014 > improved  D/c Trintellix Dr Danae Orleans   Dyspnea 06/18/2011   Dysrhythmia    hx a-flutter with successful ablation 2013   Essential  hypertension 06/18/2011   NAS diet ASA - unable to use due to h/o AVM bleeding - colon 2003   FATIGUE, ACUTE 03/23/2007   Qualifier: Diagnosis of  By: Sherene Sires MD, Charlaine Dalton    GERD (gastroesophageal reflux disease)    Heart murmur    a. 06/2011 Echo: 65-70%, mild MR   History of lower GI bleeding 09/26/2012   Followed in GI clinic/ [facility]/ [place]  - colonoscopy 05/22/2001  And 06/14/11 > c/w hemorrhoids and cecal avm    Hyperlipidemia    Hyperlipidemia LDL goal <130 03/22/2007   Followed as Primary Care Patient/ [facility]/ [place]   Zetia    S/P cardiac cath    a. in 1990's - Dr. Allyson Sabal - WNL   Scalp laceration 11/20/2014   11/01/14 > staples removed 11/18/2014     Sleep apnea    could not tolerate cpap, uses mouth guard now   TESTICULAR HYPOFUNCTION 09/15/2009   F/u urology on testosterone gel     Past Surgical History:  Procedure Laterality Date   ATRIAL FLUTTER ABLATION N/A 08/06/2011   Procedure: ATRIAL FLUTTER ABLATION;  Surgeon: Duke Salvia, MD;  Location: [facility] CATH LAB;  Service: Cardiovascular;  Laterality: N/A;   COLONOSCOPY  02/27/2016   LESION REMOVAL N/A 08/02/2014   Procedure: EXCISION UPPER LIP VASCULAR ANOMALY ;  Surgeon: Glenna Fellows, MD;  Location:  SURGERY CENTER;  Service: Plastics;  Laterality: N/A;   MASTOIDECTOMY     POLYPECTOMY     TONSILLECTOMY     TOTAL KNEE ARTHROPLASTY Left 12/19/2020   Procedure: TOTAL KNEE ARTHROPLASTY;  Surgeon: Jodi Geralds, MD;  Location: [facility] ORS;  Service: Orthopedics;  Laterality: Left;   WRIST FRACTURE SURGERY  2007   left   Family History  Problem Relation Age of Onset   Heart disease Mother    Leukemia Mother    Prostate cancer Father    Stroke Father    Hypertension Brother    Prostate cancer Brother    Colon cancer Neg Hx    Stomach cancer Neg Hx    Heart attack Neg Hx    Esophageal cancer Neg Hx    Rectal cancer Neg Hx    Colon polyps Neg Hx    Social History   Socioeconomic History   Marital  status: Married    Spouse name: Not on file   Number of children: Not on file   Years of education: Not on file   Highest education level: Not on file  Occupational History   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  Tobacco Use   Smoking status: Former    Packs/day: 3.00    Years: 7.00    Additional pack years: 0.00    Total pack years: 21.00    Types: Cigarettes    Start date: 1963  Quit date: 02/16/1968    Years since quitting: 54.4   Smokeless tobacco: Never  Vaping Use   Vaping Use: Never used  Substance and Sexual Activity   Alcohol use: Not Currently   Drug use: No   Sexual activity: Yes  Other Topics Concern   Not on file  Social History Narrative   Not on file   Social Determinants of Health   Financial Resource Strain: Low Risk  (07/29/2022)   Overall Financial Resource Strain (CARDIA)    Difficulty of Paying Living Expenses: Not hard at all  Food Insecurity: No Food Insecurity (07/29/2022)   Hunger Vital Sign    Worried About Running Out of Food in the Last Year: Never true    Ran Out of Food in the Last Year: Never true  Transportation Needs: No Transportation Needs (07/29/2022)   PRAPARE - Administrator, Civil Service (Medical): No    Lack of Transportation (Non-Medical): No  Physical Activity: Sufficiently Active (07/29/2022)   Exercise Vital Sign    Days of Exercise per Week: 7 days    Minutes of Exercise per Session: 60 min  Stress: No Stress Concern Present (07/29/2022)   [employer] of Occupational Health - Occupational Stress Questionnaire    Feeling of Stress : Not at all  Social Connections: Patient Declined (07/29/2022)   Social Connection and Isolation Panel [NHANES]    Frequency of Communication with Friends and Family: Patient declined    Frequency of Social Gatherings with Friends and Family: Patient declined    Attends Religious Services: Patient declined    Database administrator or Organizations: Patient declined     Attends Banker Meetings: Patient declined    Marital Status: Patient declined    Tobacco Counseling Counseling given: Not Answered   Clinical Intake:  Pre-visit preparation completed: Yes  Pain : No/denies pain     Nutritional Status: BMI of 19-24  Normal Nutritional Risks: None Diabetes: No  How often do you need to have someone help you when you read instructions, pamphlets, or other written materials from your doctor or pharmacy?: 1 - Never  Diabetic? no  Interpreter Needed?: No  Information entered by :: NAllen LPN   Activities of Daily Living    07/29/2022    3:31 PM  In your present state of health, do you have any difficulty performing the following activities:  Hearing? 1  Vision? 0  Difficulty concentrating or making decisions? 0  Walking or climbing stairs? 0  Dressing or bathing? 0  Doing errands, shopping? 0  Preparing Food and eating ? N  Using the Toilet? N  In the past six months, have you accidently leaked urine? N  Do you have problems with loss of bowel control? N  Managing your Medications? N  Managing your Finances? N  Housekeeping or managing your Housekeeping? N    Patient Care Team: Plotnikov, Georgina Quint, MD as PCP - General (Internal Medicine) Quintella Reichert, MD as PCP - Cardiology (Cardiology) Milagros Evener, MD as Consulting Physician (Psychiatry) Duke Salvia, MD as Consulting Physician (Cardiology) Jannifer Hick, MD as Consulting Physician (Urology)  Indicate any recent Medical Services you may have received from other than [facility] providers in the past year (date may be approximate).     Assessment:   This is a routine wellness examination for [name] Browning.  Hearing/Vision screen Vision Screening - Comments:: Regular eye exams,   Dietary issues and exercise activities discussed: Current Exercise Habits:  Home exercise routine, Type of exercise: strength training/weights;stretching;Other - see comments (golf and  stationary bike), Time (Minutes): 60, Frequency (Times/Week): 7, Weekly Exercise (Minutes/Week): 420   Goals Addressed             This Visit's Progress    Patient Stated       07/29/2022, remain mobile       Depression Screen    07/29/2022    3:31 PM 05/03/2022   10:52 AM 11/02/2021   11:16 AM 04/06/2021    1:18 PM 04/06/2021    1:16 PM 11/13/2019   10:47 AM 07/12/2018   10:15 AM  PHQ 2/9 Scores  PHQ - 2 Score 0 0 0 0 0 0 0  PHQ- 9 Score   0        Fall Risk    07/29/2022    3:31 PM 05/03/2022   10:52 AM 11/02/2021   11:16 AM 04/06/2021    1:18 PM 11/13/2019   10:52 AM  Fall Risk   Falls in the past year? 0 0 0 0 0  Number falls in past yr: 0 0 0 0 0  Injury with Fall? 0 0 0 0 0  Risk for fall due to : Medication side effect No Fall Risks No Fall Risks No Fall Risks No Fall Risks  Follow up Falls prevention discussed;Education provided;Falls evaluation completed Falls evaluation completed  Falls evaluation completed Falls evaluation completed;Education provided    FALL RISK PREVENTION PERTAINING TO THE HOME:  Any stairs in or around the home? Yes  If so, are there any without handrails? No  Home free of loose throw rugs in walkways, pet beds, electrical cords, etc? Yes  Adequate lighting in your home to reduce risk of falls? Yes   ASSISTIVE DEVICES UTILIZED TO PREVENT FALLS:  Life alert? No  Use of a cane, walker or w/c? No  Grab bars in the bathroom? Yes  Shower chair or bench in shower? Yes  Elevated toilet seat or a handicapped toilet? Yes   TIMED UP AND GO:  Was the test performed? No .       Cognitive Function:    05/25/2017    5:01 PM 05/19/2016    2:50 PM  MMSE - Mini Mental State Exam  Not completed: Refused   Orientation to time  5  Orientation to Place  5  Registration  3  Attention/ Calculation  5  Recall  2  Language- name 2 objects  2  Language- repeat  1  Language- follow 3 step command  3  Language- read & follow direction  1  Write a  sentence  1  Copy design  1  Total score  29        07/29/2022    3:33 PM  6CIT Screen  What Year? 0 points  What month? 0 points  What time? 0 points  Count back from 20 0 points  Months in reverse 0 points  Repeat phrase 2 points  Total Score 2 points    Immunizations Immunization History  Administered Date(s) Administered   COVID-19, mRNA, vaccine(Comirnaty)12 years and older 11/16/2021   Fluad Quad(high Dose 65+) 10/12/2018, 11/13/2019, 10/27/2020, 11/02/2021   Influenza Split 01/15/2013, 01/28/2014   Influenza Whole 01/16/2011, 01/06/2012, 11/16/2015   Influenza, High Dose Seasonal PF 12/02/2016, 10/31/2017   Influenza,inj,Quad PF,6+ Mos 11/18/2014   Influenza-Unspecified 10/31/2017   PFIZER Comirnaty(Gray Top)Covid-19 Tri-Sucrose Vaccine 06/14/2020   PFIZER(Purple Top)SARS-COV-2 Vaccination 03/06/2019, 03/27/2019, 10/23/2019   PNEUMOCOCCAL CONJUGATE-20  11/02/2021   Pneumococcal Conjugate-13 07/22/2014   Pneumococcal Polysaccharide-23 04/15/2008, 06/07/2016   Tdap 06/08/2011   Zoster Recombinat (Shingrix) 10/16/2017, 01/23/2018    TDAP status: Up to date  Flu Vaccine status: Up to date  Pneumococcal vaccine status: Up to date  Covid-19 vaccine status: Completed vaccines  Qualifies for Shingles Vaccine? Yes   Zostavax completed Yes   Shingrix Completed?: Yes  Screening Tests Health Maintenance  Topic Date Due   DTaP/Tdap/Td (2 - Td or Tdap) 06/07/2021   Medicare Annual Wellness (AWV)  04/06/2022   INFLUENZA VACCINE  09/16/2022   Pneumonia Vaccine 24+ Years old  Completed   COVID-19 Vaccine  Completed   Zoster Vaccines- Shingrix  Completed   HPV VACCINES  Aged Out    Health Maintenance  Health Maintenance Due  Topic Date Due   DTaP/Tdap/Td (2 - Td or Tdap) 06/07/2021   Medicare Annual Wellness (AWV)  04/06/2022    Colorectal cancer screening: No longer required.   Lung Cancer Screening: (Low Dose CT Chest recommended if Age 75-80 years, 30  pack-year currently smoking OR have quit w/in 15years.) does not qualify.   Lung Cancer Screening Referral: no  Additional Screening:  Hepatitis C Screening: does not qualify;   Vision Screening: Recommended annual ophthalmology exams for early detection of glaucoma and other disorders of the eye. Is the patient up to date with their annual eye exam?  Yes  Who is the provider or what is the name of the office in which the patient attends annual eye exams? Looking for new doctor If pt is not established with a provider, would they like to be referred to a provider to establish care? No .   Dental Screening: Recommended annual dental exams for proper oral hygiene  Community Resource Referral / Chronic Care Management: CRR required this visit?  No   CCM required this visit?  No      Plan:     I have personally reviewed and noted the following in the patient's chart:   Medical and social history Use of alcohol, tobacco or illicit drugs  Current medications and supplements including opioid prescriptions. Patient is not currently taking opioid prescriptions. Functional ability and status Nutritional status Physical activity Advanced directives List of other physicians Hospitalizations, surgeries, and ER visits in previous 12 months Vitals Screenings to include cognitive, depression, and falls Referrals and appointments  In addition, I have reviewed and discussed with patient certain preventive protocols, quality metrics, and best practice recommendations. A written personalized care plan for preventive services as well as general preventive health recommendations were provided to patient.     Barb Merino, LPN   1/61/0960   Nurse Notes: none  Due to this being a virtual visit, the after visit summary with patients personalized plan was offered to patient via mail or my-chart.  Patient would like to access on my-chart   Medical screening  examination/treatment/procedure(s) were performed by non-physician practitioner and as supervising physician I was immediately available for consultation/collaboration.  I agree with above. Jacinta Shoe, MD

## 2022-09-30 ENCOUNTER — Encounter (INDEPENDENT_AMBULATORY_CARE_PROVIDER_SITE_OTHER): Payer: Self-pay

## 2022-10-26 DIAGNOSIS — Z23 Encounter for immunization: Secondary | ICD-10-CM | POA: Diagnosis not present

## 2022-11-01 ENCOUNTER — Ambulatory Visit (INDEPENDENT_AMBULATORY_CARE_PROVIDER_SITE_OTHER): Payer: Medicare Other | Admitting: Internal Medicine

## 2022-11-01 ENCOUNTER — Encounter: Payer: Self-pay | Admitting: Internal Medicine

## 2022-11-01 VITALS — BP 124/70 | HR 61 | Temp 98.3°F | Ht 66.0 in | Wt 147.0 lb

## 2022-11-01 DIAGNOSIS — I10 Essential (primary) hypertension: Secondary | ICD-10-CM

## 2022-11-01 DIAGNOSIS — E7800 Pure hypercholesterolemia, unspecified: Secondary | ICD-10-CM

## 2022-11-01 DIAGNOSIS — Z862 Personal history of diseases of the blood and blood-forming organs and certain disorders involving the immune mechanism: Secondary | ICD-10-CM | POA: Diagnosis not present

## 2022-11-01 DIAGNOSIS — N320 Bladder-neck obstruction: Secondary | ICD-10-CM

## 2022-11-01 DIAGNOSIS — Z23 Encounter for immunization: Secondary | ICD-10-CM

## 2022-11-01 DIAGNOSIS — E559 Vitamin D deficiency, unspecified: Secondary | ICD-10-CM

## 2022-11-01 LAB — COMPREHENSIVE METABOLIC PANEL
ALT: 15 U/L (ref 0–53)
AST: 17 U/L (ref 0–37)
Albumin: 4.7 g/dL (ref 3.5–5.2)
Alkaline Phosphatase: 110 U/L (ref 39–117)
BUN: 22 mg/dL (ref 6–23)
CO2: 29 meq/L (ref 19–32)
Calcium: 10.2 mg/dL (ref 8.4–10.5)
Chloride: 103 meq/L (ref 96–112)
Creatinine, Ser: 1.16 mg/dL (ref 0.40–1.50)
GFR: 58.99 mL/min — ABNORMAL LOW (ref >60.00)
Glucose, Bld: 98 mg/dL (ref 70–99)
Potassium: 4.7 meq/L (ref 3.5–5.1)
Sodium: 143 mEq/L (ref 135–145)
Total Bilirubin: 0.5 mg/dL (ref 0.2–1.2)
Total Protein: 7.5 g/dL (ref 6.0–8.3)

## 2022-11-01 LAB — CBC WITH DIFFERENTIAL/PLATELET
Basophils Absolute: 0 10*3/uL (ref 0.0–0.1)
Basophils Relative: 0.5 % (ref 0.0–3.0)
Eosinophils Absolute: 0.2 10*3/uL (ref 0.0–0.7)
Eosinophils Relative: 3.5 % (ref 0.0–5.0)
HCT: 41.3 % (ref 39.0–52.0)
Hemoglobin: 13.5 g/dL (ref 13.0–17.0)
Lymphocytes Relative: 27.8 % (ref 12.0–46.0)
Lymphs Abs: 1.7 10*3/uL (ref 0.7–4.0)
MCHC: 32.6 g/dL (ref 30.0–36.0)
MCV: 94.2 fl (ref 78.0–100.0)
Monocytes Absolute: 0.6 10*3/uL (ref 0.1–1.0)
Monocytes Relative: 9.9 % (ref 3.0–12.0)
Neutro Abs: 3.6 10*3/uL (ref 1.4–7.7)
Neutrophils Relative %: 58.3 % (ref 43.0–77.0)
Platelets: 326 10*3/uL (ref 150.0–400.0)
RBC: 4.39 Mil/uL (ref 4.22–5.81)
RDW: 13.2 % (ref 11.5–15.5)
WBC: 6.1 10*3/uL (ref 4.0–10.5)

## 2022-11-01 LAB — LIPID PANEL
Cholesterol: 133 mg/dL (ref 0–200)
HDL: 74.9 mg/dL (ref >39.00)
LDL Cholesterol: 31 mg/dL (ref 0–99)
NonHDL: 58.42
Total CHOL/HDL Ratio: 2
Triglycerides: 138 mg/dL (ref 0.0–149.0)
VLDL: 27.6 mg/dL (ref 0.0–40.0)

## 2022-11-01 LAB — VITAMIN D 25 HYDROXY (VIT D DEFICIENCY, FRACTURES): VITD: 61.51 ng/mL (ref 30.00–100.00)

## 2022-11-01 LAB — TSH: TSH: 3.74 u[IU]/mL (ref 0.35–5.50)

## 2022-11-01 LAB — PSA: PSA: 0.78 ng/mL (ref 0.10–4.00)

## 2022-11-01 LAB — VITAMIN B12: Vitamin B-12: >1501 pg/mL — ABNORMAL HIGH (ref 211–911)

## 2022-11-01 MED ORDER — EZETIMIBE 10 MG PO TABS: 10.0000 mg | ORAL_TABLET | Freq: Every day | ORAL | 3 refills | Status: DC

## 2022-11-01 MED ORDER — VITAMIN D3 50 MCG (2000 UT) PO CAPS: 2000.0000 [IU] | ORAL_CAPSULE | Freq: Every day | ORAL | Status: AC

## 2022-11-01 NOTE — Addendum Note (Signed)
Addended by: Delsa Grana R on: 11/01/2022 12:16 PM   Modules accepted: Orders

## 2022-11-01 NOTE — Assessment & Plan Note (Signed)
Cont on Rapaflo

## 2022-11-01 NOTE — Progress Notes (Signed)
Subjective:  Patient ID: [name] Browning, male    DOB: 02-01-1941  Age: 82 y.o. MRN: [id number]  CC: Follow-up (6 MNTH F/U)   HPI [name] presents for HTN, dyslipidemia, B12 def, anemia  Outpatient Medications Prior to Visit  Medication Sig Dispense Refill   Cholecalciferol (VITAMIN D3) 2000 units capsule Take 1 capsule (2,000 Units total) by mouth daily. 100 capsule 3   cyanocobalamin 1000 MCG tablet Take 1,000 mcg by mouth daily.     Evolocumab (REPATHA SURECLICK) 140 MG/ML SOAJ INJECT 140MG  SUBCUTANEOUSLY  EVERY 2 WEEKS 6 mL 3   ezetimibe (ZETIA) 10 MG tablet Take 1 tablet (10 mg total) by mouth daily. 90 tablet 3   Multiple Vitamin tablet Take 1 tablet by mouth daily.     Multiple Vitamins-Minerals (ZINC PO) Take 1 tablet by mouth daily.     RAPAFLO 8 MG CAPS capsule Take 8 mg by mouth daily with breakfast.      No facility-administered medications prior to visit.    ROS: Review of Systems  Constitutional:  Negative for appetite change, fatigue and unexpected weight change.  HENT:  Negative for congestion, nosebleeds, sneezing, sore throat and trouble swallowing.   Eyes:  Negative for itching and visual disturbance.  Respiratory:  Negative for cough.   Cardiovascular:  Negative for chest pain, palpitations and leg swelling.  Gastrointestinal:  Negative for abdominal distention, blood in stool, diarrhea and nausea.  Genitourinary:  Negative for frequency and hematuria.  Musculoskeletal:  Negative for back pain, gait problem, joint swelling and neck pain.  Skin:  Negative for rash.  Neurological:  Negative for dizziness, tremors, speech difficulty and weakness.  Psychiatric/Behavioral:  Negative for agitation, dysphoric mood and sleep disturbance. The patient is not nervous/anxious.     Objective:  BP 124/70 (BP Location: Right Arm, Patient Position: Sitting, Cuff Size: Large)   Pulse 61   Temp 98.3 F (36.8 C) (Oral)   Ht 5\' 6"  (1.676 m)   Wt 147 lb (66.7  kg)   SpO2 96%   BMI 23.73 kg/m   BP Readings from Last 3 Encounters:  11/01/22 124/70  05/03/22 130/78  04/07/22 (!) 168/70    Wt Readings from Last 3 Encounters:  11/01/22 147 lb (66.7 kg)  07/29/22 145 lb (65.8 kg)  05/03/22 150 lb (68 kg)    Physical Exam Constitutional:      General: He is not in acute distress.    Appearance: Normal appearance. He is well-developed.     Comments: NAD  Eyes:     Conjunctiva/sclera: Conjunctivae normal.     Pupils: Pupils are equal, round, and reactive to light.  Neck:     Thyroid: No thyromegaly.     Vascular: No JVD.  Cardiovascular:     Rate and Rhythm: Normal rate and regular rhythm.     Heart sounds: Normal heart sounds. No murmur heard.    No friction rub. No gallop.  Pulmonary:     Effort: Pulmonary effort is normal. No respiratory distress.     Breath sounds: Normal breath sounds. No wheezing or rales.  Chest:     Chest wall: No tenderness.  Abdominal:     General: Bowel sounds are normal. There is no distension.     Palpations: Abdomen is soft. There is no mass.     Tenderness: There is no abdominal tenderness. There is no guarding or rebound.  Musculoskeletal:        General: No tenderness. Normal range  of motion.     Cervical back: Normal range of motion.  Lymphadenopathy:     Cervical: No cervical adenopathy.  Skin:    General: Skin is warm and dry.     Findings: No rash.  Neurological:     Mental Status: He is alert and oriented to person, place, and time.     Cranial Nerves: No cranial nerve deficit.     Motor: No abnormal muscle tone.     Coordination: Coordination normal.     Gait: Gait normal.     Deep Tendon Reflexes: Reflexes are normal and symmetric.  Psychiatric:        Behavior: Behavior normal.        Thought Content: Thought content normal.        Judgment: Judgment normal.     Lab Results  Component Value Date   WBC 5.1 05/03/2022   HGB 13.1 05/03/2022   HCT 38.4 (L) 05/03/2022   PLT  307.0 05/03/2022   GLUCOSE 98 05/03/2022   CHOL 119 05/03/2022   TRIG 143.0 05/03/2022   HDL 66.30 05/03/2022   LDLDIRECT 152.0 10/12/2018   LDLCALC 24 05/03/2022   ALT 15 05/03/2022   AST 19 05/03/2022   NA 141 05/03/2022   K 4.7 05/03/2022   CL 104 05/03/2022   CREATININE 1.11 05/03/2022   BUN 21 05/03/2022   CO2 29 05/03/2022   TSH 5.06 11/02/2021   PSA 0.68 11/02/2021   INR 0.9 12/10/2020   HGBA1C 5.8 (H) 05/19/2016    MR BRAIN/IAC W WO CONTRAST  Result Date: 04/05/2022 CLINICAL DATA:  Provided history: Sensory hearing loss, bilateral. EXAM: MRI HEAD WITHOUT AND WITH CONTRAST TECHNIQUE: Multiplanar, multiecho pulse sequences of the brain and surrounding structures were obtained without and with intravenous contrast. CONTRAST:  7.5 mL Vueway intravenous contrast. COMPARISON:  Brain MRI 06/01/2016. FINDINGS: Brain: Mild generalized parenchymal atrophy. Mild multifocal T2 FLAIR hyperintense signal abnormality within the cerebral white matter and pons, nonspecific but compatible with chronic small vessel ischemic disease. No evidence of an intracranial mass. Specifically, no cerebellopontine angle or internal auditory canal mass is demonstrated. Unremarkable appearance of the 7th and 8th cranial nerves bilaterally. There is no acute infarct. No chronic intracranial blood products. No extra-axial fluid collection. No midline shift. No pathologic intracranial enhancement identified. Vascular: Maintained flow voids within the proximal large arterial vessels. Skull and upper cervical spine: No focal suspicious marrow lesion. Incompletely assessed cervical spondylosis. Ligamentous hypertrophy/pannus formation posterior to the dens, mildly narrowing the upper cervical spinal canal. Sinuses/Orbits: No orbital mass or acute orbital finding. Prior bilateral ocular lens replacement. Trace mucosal thickening within the bilateral ethmoid and maxillary sinuses. Other: Small-volume fluid within the left  mastoid air cells. IMPRESSION: 1.  No evidence of an acute intracranial abnormality. 2. No cerebellopontine angle or internal auditory canal mass. 3. No specific cause of hearing loss is identified. 4. Mild chronic small vessel ischemic changes within the cerebral white matter and pons, similar to the prior brain MRI of 06/01/2016. 5. Mild generalized parenchymal atrophy. 6. Small-volume fluid within the left mastoid air cells. Electronically Signed   By: Jackey Loge D.O.   On: 04/05/2022 18:16    Assessment & Plan:   Problem List Items Addressed This Visit     Hyperlipidemia    Cont on Repatha, Zetia      Essential hypertension - Primary    On NAS diet      Bladder neck obstruction    Cont on Rapaflo  No orders of the defined types were placed in this encounter.     Follow-up: No follow-ups on file.  Sonda Primes, MD

## 2022-11-01 NOTE — Assessment & Plan Note (Signed)
On NAS diet

## 2022-11-01 NOTE — Assessment & Plan Note (Signed)
Check CBC, iron, B12

## 2022-11-01 NOTE — Assessment & Plan Note (Signed)
Cont on Repatha, Zetia

## 2022-11-02 LAB — IRON,TIBC AND FERRITIN PANEL
%SAT: 32 % (ref 20–48)
Ferritin: 76 ng/mL (ref 24–380)
Iron: 122 ug/dL (ref 50–180)
TIBC: 376 ug/dL (ref 250–425)

## 2022-11-29 DIAGNOSIS — H6123 Impacted cerumen, bilateral: Secondary | ICD-10-CM | POA: Insufficient documentation

## 2022-11-29 DIAGNOSIS — H903 Sensorineural hearing loss, bilateral: Secondary | ICD-10-CM | POA: Diagnosis not present

## 2022-12-01 DIAGNOSIS — L812 Freckles: Secondary | ICD-10-CM | POA: Diagnosis not present

## 2022-12-01 DIAGNOSIS — L821 Other seborrheic keratosis: Secondary | ICD-10-CM | POA: Diagnosis not present

## 2022-12-01 DIAGNOSIS — D1801 Hemangioma of skin and subcutaneous tissue: Secondary | ICD-10-CM | POA: Diagnosis not present

## 2022-12-01 DIAGNOSIS — Z85828 Personal history of other malignant neoplasm of skin: Secondary | ICD-10-CM | POA: Diagnosis not present

## 2023-01-12 DIAGNOSIS — Z125 Encounter for screening for malignant neoplasm of prostate: Secondary | ICD-10-CM | POA: Diagnosis not present

## 2023-01-19 DIAGNOSIS — N401 Enlarged prostate with lower urinary tract symptoms: Secondary | ICD-10-CM | POA: Diagnosis not present

## 2023-01-19 DIAGNOSIS — R351 Nocturia: Secondary | ICD-10-CM | POA: Diagnosis not present

## 2023-03-22 ENCOUNTER — Other Ambulatory Visit: Payer: Self-pay | Admitting: Cardiology

## 2023-03-22 DIAGNOSIS — E7800 Pure hypercholesterolemia, unspecified: Secondary | ICD-10-CM

## 2023-04-18 ENCOUNTER — Encounter: Payer: Self-pay | Admitting: Cardiology

## 2023-04-18 ENCOUNTER — Encounter: Payer: Self-pay | Admitting: Internal Medicine

## 2023-04-18 ENCOUNTER — Ambulatory Visit: Payer: Medicare Other | Attending: Cardiology | Admitting: Cardiology

## 2023-04-18 ENCOUNTER — Ambulatory Visit (INDEPENDENT_AMBULATORY_CARE_PROVIDER_SITE_OTHER): Admitting: Internal Medicine

## 2023-04-18 ENCOUNTER — Ambulatory Visit (INDEPENDENT_AMBULATORY_CARE_PROVIDER_SITE_OTHER)

## 2023-04-18 VITALS — BP 142/70 | HR 63 | Temp 97.9°F | Ht 66.0 in | Wt 148.0 lb

## 2023-04-18 VITALS — BP 160/62 | HR 64 | Ht 66.0 in | Wt 147.0 lb

## 2023-04-18 DIAGNOSIS — I483 Typical atrial flutter: Secondary | ICD-10-CM | POA: Diagnosis not present

## 2023-04-18 DIAGNOSIS — E7800 Pure hypercholesterolemia, unspecified: Secondary | ICD-10-CM

## 2023-04-18 DIAGNOSIS — I2510 Atherosclerotic heart disease of native coronary artery without angina pectoris: Secondary | ICD-10-CM | POA: Diagnosis not present

## 2023-04-18 DIAGNOSIS — I10 Essential (primary) hypertension: Secondary | ICD-10-CM | POA: Diagnosis not present

## 2023-04-18 DIAGNOSIS — R55 Syncope and collapse: Secondary | ICD-10-CM

## 2023-04-18 DIAGNOSIS — E559 Vitamin D deficiency, unspecified: Secondary | ICD-10-CM

## 2023-04-18 DIAGNOSIS — R059 Cough, unspecified: Secondary | ICD-10-CM | POA: Diagnosis not present

## 2023-04-18 DIAGNOSIS — E538 Deficiency of other specified B group vitamins: Secondary | ICD-10-CM

## 2023-04-18 DIAGNOSIS — R739 Hyperglycemia, unspecified: Secondary | ICD-10-CM | POA: Diagnosis not present

## 2023-04-18 LAB — POCT RESPIRATORY SYNCYTIAL VIRUS: RSV Rapid Ag: NEGATIVE

## 2023-04-18 LAB — POCT INFLUENZA A/B
Influenza A, POC: NEGATIVE
Influenza B, POC: NEGATIVE

## 2023-04-18 LAB — POC COVID19 BINAXNOW: SARS Coronavirus 2 Ag: NEGATIVE

## 2023-04-18 MED ORDER — AZITHROMYCIN 250 MG PO TABS: ORAL_TABLET | ORAL | 1 refills | Status: AC

## 2023-04-18 NOTE — Progress Notes (Unsigned)
 Patient ID: [name] Browning, male   DOB: February 05, 1941, 83 y.o.   MRN: [id number]        Chief Complaint: follow up URI,  syncope with forehead abrasion,        HPI:  [name] Browning is a 83 y.o. male here with c/o  3 days osnet feverish and  Here with facial pressure, headache, general weakness and malaise, and greenish d/c, with mild ST and cough, but pt denies chest pain, wheezing, increased sob or doe, orthopnea, PND, increased LE swelling, palpitations, Did however has frank syncope 2 days ago after taking nyquil PM the evening prior.  Has a small abrasion to mid upper forehead but . Pt denies polydipsia, polyuria, or new focal neuro s/s.  Pt denies recent wt loss, night sweats, loss of appetite, or other constitutional symptoms  Saw cardiology this am, has card event monitor in place.        Wt Readings from Last 3 Encounters:  04/18/23 148 lb (67.1 kg)  04/18/23 147 lb (66.7 kg)  11/01/22 147 lb (66.7 kg)   BP Readings from Last 3 Encounters:  04/18/23 (!) 142/70  04/18/23 (!) 160/62  11/01/22 124/70         Past Medical History:  Diagnosis Date   Acute bronchitis 08/11/2016   6/18  H/o asthma as a child...   ADD (attention deficit disorder)    Anemia in chronic illness 06/15/2013   Followed as Primary Care Patient/ [facility]/ [place] - Fe studies ok 06/15/13 > stool cards received 06/27/2013 > neg x 5> down to 11.4 08/23/13  > referred for hematology 09/04/2013> Bertis Ruddy felt it was due to low Testosterone  Resolved as of 08/14/2015         Anxiety    Asthma    AS A CHILD   Ataxia 06/07/2016   2018 poss age related/meds   Atrial flutter (HCC)    a. s/p RFCA 07/2011.  Xarelto d/c 08/2011.   Bladder neck obstruction 10/04/2016   Dr Vernie Ammons [place]   Cataracts, bilateral    Colon polyps 10/04/2016   Dr Christella Hartigan Due colon 02/2019   Conjunctivitis 09/14/2013   Cough 02/19/2014   DgEs   01/23/16 Unremarkable esophagram. Allergy profile 04/05/2016 >  Eos 0.4 /  IgE  284  RAST pos  grass/trees/dust > rec allergy eval    Depression    Diarrhea 02/11/2017   Disturbance in sleep behavior 04/30/2010    Trazodone  Lorazepam prn   DIZZINESS 01/24/2008   Qualifier: Diagnosis of  By: Clent Ridges NP, Tammy     DJD (degenerative joint disease) of knee    Drug-induced memory loss (HCC) 02/22/2014   D/c statin 01/2014 > improved  D/c Trintellix Dr Danae Orleans   Dyspnea 06/18/2011   Dysrhythmia    hx a-flutter with successful ablation 2013   Essential hypertension 06/18/2011   NAS diet ASA - unable to use due to h/o AVM bleeding - colon 2003   FATIGUE, ACUTE 03/23/2007   Qualifier: Diagnosis of  By: Sherene Sires MD, Charlaine Dalton    GERD (gastroesophageal reflux disease)    Heart murmur    a. 06/2011 Echo: 65-70%, mild MR   History of lower GI bleeding 09/26/2012   Followed in GI clinic/ [facility]/ [place]  - colonoscopy 05/22/2001  And 06/14/11 > c/w hemorrhoids and cecal avm    Hyperlipidemia    Hyperlipidemia LDL goal <130 03/22/2007   Followed as Primary Care Patient/ [facility]/ [place]   Zetia  S/P cardiac cath    a. in 1990's - Dr. Allyson Sabal - WNL   Scalp laceration 11/20/2014   11/01/14 > staples removed 11/18/2014     Sleep apnea    could not tolerate cpap, uses mouth guard now   TESTICULAR HYPOFUNCTION 09/15/2009   F/u urology on testosterone gel     Past Surgical History:  Procedure Laterality Date   ATRIAL FLUTTER ABLATION N/A 08/06/2011   Procedure: ATRIAL FLUTTER ABLATION;  Surgeon: Duke Salvia, MD;  Location: [facility] CATH LAB;  Service: Cardiovascular;  Laterality: N/A;   COLONOSCOPY  02/27/2016   LESION REMOVAL N/A 08/02/2014   Procedure: EXCISION UPPER LIP VASCULAR ANOMALY ;  Surgeon: Glenna Fellows, MD;  Location: [facility];  Service: Plastics;  Laterality: N/A;   MASTOIDECTOMY     POLYPECTOMY     TONSILLECTOMY     TOTAL KNEE ARTHROPLASTY Left 12/19/2020   Procedure: TOTAL KNEE ARTHROPLASTY;  Surgeon: Jodi Geralds, MD;  Location: [facility];  Service:  Orthopedics;  Laterality: Left;   WRIST FRACTURE SURGERY  2007   left    reports that he quit smoking about 55 years ago. His smoking use included cigarettes. He started smoking about 62 years ago. He has a 21 pack-year smoking history. He has never used smokeless tobacco. He reports that he does not currently use alcohol. He reports that he does not use drugs. family history includes Heart disease in his mother; Hypertension in his brother; Leukemia in his mother; Prostate cancer in his brother and father; Stroke in his father. Allergies  Allergen Reactions   Crestor [Rosuvastatin]     Memory issues   Trintellix [Vortioxetine]     Memory loss   Zocor [Simvastatin]     Memory issues   Current Outpatient Medications on File Prior to Visit  Medication Sig Dispense Refill   Cholecalciferol (VITAMIN D3) 50 MCG (2000 UT) capsule Take 1 capsule (2,000 Units total) by mouth daily.     cyanocobalamin 1000 MCG tablet Take 1,000 mcg by mouth daily.     Evolocumab (REPATHA SURECLICK) 140 MG/ML SOAJ INJECT 140MG  SUBCUTANEOUSLY  EVERY 2 WEEKS 6 mL 3   ezetimibe (ZETIA) 10 MG tablet Take 1 tablet (10 mg total) by mouth daily. 90 tablet 3   Multiple Vitamin tablet Take 1 tablet by mouth daily.     Multiple Vitamins-Minerals (ZINC PO) Take 1 tablet by mouth daily.     RAPAFLO 8 MG CAPS capsule Take 8 mg by mouth daily with breakfast.      No current facility-administered medications on file prior to visit.        ROS:  All others reviewed and negative.  Objective        PE:  BP (!) 142/70 (BP Location: Right Arm, Patient Position: Sitting, Cuff Size: Normal)   Pulse 63   Temp 97.9 F (36.6 C) (Oral)   Ht 5\' 6"  (1.676 m)   Wt 148 lb (67.1 kg)   SpO2 98%   BMI 23.89 kg/m                 Constitutional: Pt appears mild ill               HENT: Head: NCAT.                Right Ear: External ear normal.                 Left Ear: External ear normal.  Eyes: . Pupils are equal,  round, and reactive to light. Conjunctivae and EOM are normal               Nose: without d/c or deformity               Neck: Neck supple. Gross normal ROM               Cardiovascular: Normal rate and regular rhythm.                 Pulmonary/Chest: Effort normal and breath sounds without rales or wheezing.                Abd:  Soft, NT, ND, + BS, no organomegaly               Neurological: Pt is alert. At baseline orientation, motor grossly intact               Skin: Skin is warm. No rashes, no other new lesions, LE edema - none               Psychiatric: Pt behavior is normal without agitation   Micro: none  Cardiac tracings I have personally interpreted today:  none  Pertinent Radiological findings (summarize): none   Lab Results  Component Value Date   WBC 6.1 11/01/2022   HGB 13.5 11/01/2022   HCT 41.3 11/01/2022   PLT 326.0 11/01/2022   GLUCOSE 98 11/01/2022   CHOL 133 11/01/2022   TRIG 138.0 11/01/2022   HDL 74.90 11/01/2022   LDLDIRECT 152.0 10/12/2018   LDLCALC 31 11/01/2022   ALT 15 11/01/2022   AST 17 11/01/2022   NA 143 11/01/2022   K 4.7 11/01/2022   CL 103 11/01/2022   CREATININE 1.16 11/01/2022   BUN 22 11/01/2022   CO2 29 11/01/2022   TSH 3.74 11/01/2022   PSA 0.78 11/01/2022   INR 0.9 12/10/2020   HGBA1C 5.8 (H) 05/19/2016   POCT  - Covid - neg, Flu  - neg, RSV - neg  Assessment/Plan:  THEUS ESPIN is a 83 y.o. White or Caucasian [1] male with  has a past medical history of Acute bronchitis (08/11/2016), ADD (attention deficit disorder), Anemia in chronic illness (06/15/2013), Anxiety, Asthma, Ataxia (06/07/2016), Atrial flutter (HCC), Bladder neck obstruction (10/04/2016), Cataracts, bilateral, Colon polyps (10/04/2016), Conjunctivitis (09/14/2013), Cough (02/19/2014), Depression, Diarrhea (02/11/2017), Disturbance in sleep behavior (04/30/2010), DIZZINESS (01/24/2008), DJD (degenerative joint disease) of knee, Drug-induced memory loss (HCC) (02/22/2014),  Dyspnea (06/18/2011), Dysrhythmia, Essential hypertension (06/18/2011), FATIGUE, ACUTE (03/23/2007), GERD (gastroesophageal reflux disease), Heart murmur, History of lower GI bleeding (09/26/2012), Hyperlipidemia, Hyperlipidemia LDL goal <130 (03/22/2007), S/P cardiac cath, Scalp laceration (11/20/2014), Sleep apnea, and TESTICULAR HYPOFUNCTION (09/15/2009).  Cough C/w likely uri possible bronichitis, viral testing neg, for zpack asd, .delsym otc prn, declines cxr for now,  to f/u any worsening symptoms or concerns  Hyperlipidemia Lab Results  Component Value Date   LDLCALC 31 11/01/2022   Stable, pt to continue zetia 10 mg and repatha 140 mg   B12 deficiency Lab Results  Component Value Date   VITAMINB12 >1501 (H) 11/01/2022   Now overcontrolled, to reduce oral replacement - b12 1000 mcg to m - w - f  Followup: Return if symptoms worsen or fail to improve.  Oliver Barre, MD 04/20/2023 6:29 AM [facility] Internal Medicine

## 2023-04-18 NOTE — Patient Instructions (Signed)
 Medication Instructions:  The current medical regimen is effective;  continue present plan and medications.  *If you need a refill on your cardiac medications before your next appointment, please call your pharmacy*  Testing/Procedures: ZIO AT Long term monitor-Live Telemetry  Your physician has requested you wear a ZIO patch monitor for 14 days.  This is a single patch monitor. [employer] supplies one patch monitor per enrollment. Additional  stickers are not available.  Please do not apply patch if you will be having a Nuclear Stress Test, Echocardiogram, Cardiac CT, MRI,  or Chest Xray during the period you would be wearing the monitor. The patch cannot be worn during  these tests. You cannot remove and re-apply the ZIO AT patch monitor.  Your ZIO patch monitor will be mailed 3 day USPS to your address on file. It may take 3-5 days to  receive your monitor after you have been enrolled.  Once you have received your monitor, please review the enclosed instructions. Your monitor has  already been registered assigning a specific monitor serial # to you.   Billing and Patient Assistance Program information  Meredeth Ide has been supplied with any insurance information on record for billing. [employer] offers a sliding scale Patient Assistance Program for patients without insurance, or whose  insurance does not completely cover the cost of the ZIO patch monitor. You must apply for the  Patient Assistance Program to qualify for the discounted rate. To apply, call [employer] at [phone],  select option 4, select option 2 , ask to apply for the Patient Assistance Program, (you can request an  interpreter if needed). [employer] will ask your household income and how many people are in your  household. [employer] will quote your out-of-pocket cost based on this information. They will also be able  to set up a 12 month interest free payment plan if needed.  Applying the monitor   Shave hair from upper left  chest.  Hold the abrader disc by orange tab. Rub the abrader in 40 strokes over left upper chest as indicated in  your monitor instructions.  Clean area with 4 enclosed alcohol pads. Use all pads to ensure the area is cleaned thoroughly. Let  dry.  Apply patch as indicated in monitor instructions. Patch will be placed under collarbone on left side of  chest with arrow pointing upward.  Rub patch adhesive wings for 2 minutes. Remove the white label marked "1". Remove the white label  marked "2". Rub patch adhesive wings for 2 additional minutes.  While looking in a mirror, press and release button in center of patch. A small green light will flash 3-4  times. This will be your only indicator that the monitor has been turned on.  Do not shower for the first 24 hours. You may shower after the first 24 hours.  Press the button if you feel a symptom. You will hear a small click. Record Date, Time and Symptom in  the Patient Log.   Starting the Gateway  In your kit there is a Audiological scientist box the size of a cellphone. This is Buyer, retail. It transmits all your  recorded data to [facility]. This box must always stay within 10 feet of you. Open the box and push the *  button. There will be a light that blinks orange and then green a few times. When the light stops  blinking, the Gateway is connected to the ZIO patch. Call [employer] at [phone] to confirm your monitor is transmitting.  Returning your monitor  Remove your patch and place it inside the Gateway. In the lower half of the Gateway there is a white  bag with prepaid postage on it. Place Gateway in bag and seal. Mail package back to [place] as soon as  possible. Your physician should have your final report approximately 7 days after you have mailed back  your monitor. Call [facility] Customer Care at [phone] if you have questions regarding your ZIO AT  patch monitor. Call them immediately if you see an orange light  blinking on your monitor.  If your monitor falls off in less than 4 days, contact our Monitor department at [phone]. If your  monitor becomes loose or falls off after 4 days call [employer] at [phone] for suggestions on  securing your monitor   Follow-Up: At [facility], you and your health needs are our priority.  As part of our continuing mission to provide you with exceptional heart care, we have created designated Provider Care Teams.  These Care Teams include your primary Cardiologist (physician) and Advanced Practice Providers (APPs -  Physician Assistants and Nurse Practitioners) who all work together to provide you with the care you need, when you need it.  We recommend signing up for the patient portal called "MyChart".  Sign up information is provided on this After Visit Summary.  MyChart is used to connect with patients for Virtual Visits (Telemedicine).  Patients are able to view lab/test results, encounter notes, upcoming appointments, etc.  Non-urgent messages can be sent to your provider as well.   To learn more about what you can do with MyChart, go to [email or web address].    Your next appointment:   1 year(s)  Provider:   Armanda Magic, MD      Please see your Primary Care Doctor in the next one to two days.

## 2023-04-18 NOTE — Progress Notes (Signed)
 Cardiology  Note    Date:  04/18/2023   ID:  [name] Browning, DOB 23-Jun-1940, MRN [id number]  PCP:  Tresa Garter, MD  Cardiologist:  Armanda Magic, MD   Chief Complaint  Patient presents with   Follow-up    Coronary artery calcium patient's, hypertension, hyperlipidemia, paroxysmal atrial flutter    History of Present Illness:  [name] Browning is a 83 y.o. male  with a hx of asthma, remote aflutter s/p RFCA 2013, HTN and HLD.  He had a heart cath done in 1990's that was normal but noted to have coronary Ca with a score of 273 on coronary Ca screening as well as aortic atherosclerosis in 11/2018 .   Lexiscan myoview showed no ischemia. 2D echo showed normal LVF.  He is statin intolerant and is now on Repatha and Zetia.   He is here today for followup.  He developed a cough over the weekend and his wife got some Nyquil cough and congestion and then fexofenadine. The next morning he felt very groggy and wobbly and then go up the next morning and passed out.  He does not know if he tripped and does not remember passing out or falling.  His BP an hour later was 120/56mmHg.  He says that he had been feeling dizzy with vertigo at times.   He denies any chest pain or pressure, SOB, DOE, PND, orthopnea, LE edema, palpitations. He is compliant with his meds and is tolerating meds with no SE.    Past Medical History:  Diagnosis Date   Acute bronchitis 08/11/2016   6/18  H/o asthma as a child...   ADD (attention deficit disorder)    Anemia in chronic illness 06/15/2013   Followed as Primary Care Patient/ [facility]/ [facility] - Fe studies ok 06/15/13 > stool cards received 06/27/2013 > neg x 5> down to 11.4 08/23/13  > referred for hematology 09/04/2013> Bertis Ruddy felt it was due to low Testosterone  Resolved as of 08/14/2015         Anxiety    Asthma    AS A CHILD   Ataxia 06/07/2016   2018 poss age related/meds   Atrial flutter (HCC)    a. s/p RFCA 07/2011.  Xarelto d/c 08/2011.   Bladder neck  obstruction 10/04/2016   Dr Vernie Ammons Rapaflo   Cataracts, bilateral    Colon polyps 10/04/2016   Dr Christella Hartigan Due colon 02/2019   Conjunctivitis 09/14/2013   Cough 02/19/2014   DgEs   01/23/16 Unremarkable esophagram. Allergy profile 04/05/2016 >  Eos 0.4 /  IgE  284  RAST pos grass/trees/dust > rec allergy eval    Depression    Diarrhea 02/11/2017   Disturbance in sleep behavior 04/30/2010    Trazodone  Lorazepam prn   DIZZINESS 01/24/2008   Qualifier: Diagnosis of  By: Clent Ridges NP, Tammy     DJD (degenerative joint disease) of knee    Drug-induced memory loss (HCC) 02/22/2014   D/c statin 01/2014 > improved  D/c Trintellix Dr Danae Orleans   Dyspnea 06/18/2011   Dysrhythmia    hx a-flutter with successful ablation 2013   Essential hypertension 06/18/2011   NAS diet ASA - unable to use due to h/o AVM bleeding - colon 2003   FATIGUE, ACUTE 03/23/2007   Qualifier: Diagnosis of  By: Sherene Sires MD, Charlaine Dalton    GERD (gastroesophageal reflux disease)    Heart murmur    a. 06/2011 Echo: 65-70%, mild MR   History of lower  GI bleeding 09/26/2012   Followed in GI clinic/ [facility]/ Jarold Motto  - colonoscopy 05/22/2001  And 06/14/11 > c/w hemorrhoids and cecal avm    Hyperlipidemia    Hyperlipidemia LDL goal <130 03/22/2007   Followed as Primary Care Patient/ [facility]/ [facility]   Zetia    S/P cardiac cath    a. in 1990's - Dr. Allyson Sabal - WNL   Scalp laceration 11/20/2014   11/01/14 > staples removed 11/18/2014     Sleep apnea    could not tolerate cpap, uses mouth guard now   TESTICULAR HYPOFUNCTION 09/15/2009   F/u urology on testosterone gel      Past Surgical History:  Procedure Laterality Date   ATRIAL FLUTTER ABLATION N/A 08/06/2011   Procedure: ATRIAL FLUTTER ABLATION;  Surgeon: Duke Salvia, MD;  Location: [facility] CATH LAB;  Service: Cardiovascular;  Laterality: N/A;   COLONOSCOPY  02/27/2016   LESION REMOVAL N/A 08/02/2014   Procedure: EXCISION UPPER LIP VASCULAR ANOMALY ;  Surgeon: Glenna Fellows, MD;   Location: [facility];  Service: Plastics;  Laterality: N/A;   MASTOIDECTOMY     POLYPECTOMY     TONSILLECTOMY     TOTAL KNEE ARTHROPLASTY Left 12/19/2020   Procedure: TOTAL KNEE ARTHROPLASTY;  Surgeon: Jodi Geralds, MD;  Location: [facility];  Service: Orthopedics;  Laterality: Left;   WRIST FRACTURE SURGERY  2007   left    Current Medications: Current Meds  Medication Sig   Cholecalciferol (VITAMIN D3) 50 MCG (2000 UT) capsule Take 1 capsule (2,000 Units total) by mouth daily.   cyanocobalamin 1000 MCG tablet Take 1,000 mcg by mouth daily.   Evolocumab (REPATHA SURECLICK) 140 MG/ML SOAJ INJECT 140MG  SUBCUTANEOUSLY  EVERY 2 WEEKS   ezetimibe (ZETIA) 10 MG tablet Take 1 tablet (10 mg total) by mouth daily.   Multiple Vitamin tablet Take 1 tablet by mouth daily.   Multiple Vitamins-Minerals (ZINC PO) Take 1 tablet by mouth daily.   RAPAFLO 8 MG CAPS capsule Take 8 mg by mouth daily with breakfast.     Allergies:   Crestor [rosuvastatin], Trintellix [vortioxetine], and Zocor [simvastatin]   Social History   Socioeconomic History   Marital status: Married    Spouse name: Not on file   Number of children: Not on file   Years of education: Not on file   Highest education level: Not on file  Occupational History   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  Tobacco Use   Smoking status: Former    Current packs/day: 0.00    Average packs/day: 3.0 packs/day for 7.0 years (21.0 ttl pk-yrs)    Types: Cigarettes    Start date: 1963    Quit date: 02/16/1968    Years since quitting: 55.2   Smokeless tobacco: Never  Vaping Use   Vaping status: Never Used  Substance and Sexual Activity   Alcohol use: Not Currently   Drug use: No   Sexual activity: Yes  Other Topics Concern   Not on file  Social History Narrative   Not on file   Social Drivers of Health   Financial Resource Strain: Low Risk  (07/29/2022)   Overall Financial Resource Strain (CARDIA)    Difficulty  of Paying Living Expenses: Not hard at all  Food Insecurity: Low Risk  (11/29/2022)   Received from [facility]   Hunger Vital Sign    Worried About Running Out of Food in the Last Year: Never true    Ran Out of  Food in the Last Year: Never true  Transportation Needs: No Transportation Needs (11/29/2022)   Received from [facility]    In the past 12 months, has lack of reliable transportation kept you from medical appointments, meetings, work or from getting things needed for daily living? : No  Physical Activity: Sufficiently Active (07/29/2022)   Exercise Vital Sign    Days of Exercise per Week: 7 days    Minutes of Exercise per Session: 60 min  Stress: No Stress Concern Present (07/29/2022)   [employer] of Occupational Health - Occupational Stress Questionnaire    Feeling of Stress : Not at all  Social Connections: Patient Declined (07/29/2022)   Social Connection and Isolation Panel [NHANES]    Frequency of Communication with Friends and Family: Patient declined    Frequency of Social Gatherings with Friends and Family: Patient declined    Attends Religious Services: Patient declined    Database administrator or Organizations: Patient declined    Attends Engineer, structural: Patient declined    Marital Status: Patient declined     Family History:  The patient's family history includes Heart disease in his mother; Hypertension in his brother; Leukemia in his mother; Prostate cancer in his brother and father; Stroke in his father.   ROS:   Please see the history of present illness.    ROS All other systems reviewed and are negative.      No data to display             PHYSICAL EXAM:   VS:  BP (!) 160/62   Pulse 64   Ht 5\' 6"  (1.676 m)   Wt 147 lb (66.7 kg)   SpO2 98%   BMI 23.73 kg/m      GEN: Well nourished, well developed in no acute distress HEENT: Normal NECK: No JVD; No carotid bruits LYMPHATICS: No  lymphadenopathy CARDIAC:RRR, no murmurs, rubs, gallops RESPIRATORY:  Clear to auscultation without rales, wheezing or rhonchi  ABDOMEN: Soft, non-tender, non-distended MUSCULOSKELETAL:  No edema; No deformity  SKIN: Warm and dry NEUROLOGIC:  Alert and oriented x 3 PSYCHIATRIC:  Normal affect  Wt Readings from Last 3 Encounters:  04/18/23 147 lb (66.7 kg)  11/01/22 147 lb (66.7 kg)  07/29/22 145 lb (65.8 kg)      Studies/Labs Reviewed:   EKG Interpretation Date/Time:  Monday April 18 2023 10:28:25 EST Ventricular Rate:  64 PR Interval:  200 QRS Duration:  82 QT Interval:  382 QTC Calculation: 394 R Axis:   80  Text Interpretation: Normal sinus rhythm Nonspecific T wave abnormality When compared with ECG of 07-Aug-2011 04:35, Non-specific change in ST segment in Anterolateral leads Confirmed by Armanda Magic ([id number]) on 04/18/2023 10:31:49 AM    Recent Labs: 11/01/2022: ALT 15; BUN 22; Creatinine, Ser 1.16; Hemoglobin 13.5; Platelets 326.0; Potassium 4.7; Sodium 143; TSH 3.74   Lipid Panel    Component Value Date/Time   CHOL 133 11/01/2022 1157   CHOL 110 04/04/2019 0816   TRIG 138.0 11/01/2022 1157   HDL 74.90 11/01/2022 1157   HDL 63 04/04/2019 0816   CHOLHDL 2 11/01/2022 1157   VLDL 27.6 11/01/2022 1157   LDLCALC 31 11/01/2022 1157   LDLCALC 23 10/26/2019 1221   LDLDIRECT 152.0 10/12/2018 1554    Additional studies/ records that were reviewed today include:  Office notes from PCP    ASSESSMENT:    1. Coronary artery calcification seen on CAT scan   2.  Essential hypertension   3. Pure hypercholesterolemia   4. Typical atrial flutter (HCC)         PLAN:  In order of problems listed above:  1.  Coronary artery calcifications -coronary Ca score was elevated at 273 which was 48th% for age and sex matched controls.   -lexiscan myoview showed no ischemia 01/2019 -He denies any anginal symptoms since I saw him last -he has been intolerant to statins and is  on Repatha -Continue aspirin 81 mg daily  2.  HTN -Controlled on exam today -He has not required any antihypertensive therapy  3.  HLD -LDL goal < 70 -he did not tolerate simvastatin due to memory issues -I have personally reviewed and interpreted outside labs performed by patient's PCP which showed LDL 31, HDL 75 and ALT 15 on 11/01/2022 -Continue drug management with Repatha and Zetia 10 mg daily with as needed refills  4.  PAflutter -s/p remote ablation 2013 -He continues to maintain normal sinus rhythm and has not had any palpitations since I saw him last  5.  Syncope -this occurred in the setting of decreased PO intake this week and development of a chest cold -he also started taking cold medicine. He has stopped the cold medicine -he is not orthostatic in office today -check 2 week ziopatch to assess for arrhythmias -if he has any further syncope he will let me know and may need an ILR -encouraged to drink at least 64oz of fluid daily    Medication Adjustments/Labs and Tests Ordered: Current medicines are reviewed at length with the patient today.  Concerns regarding medicines are outlined above.  Medication changes, Labs and Tests ordered today are listed in the Patient Instructions below.  There are no Patient Instructions on file for this visit.   Signed, Armanda Magic, MD  04/18/2023 10:34 AM    [facility] [address] [place], [place], [place]  [address] Phone: [phone]; Fax: [phone]

## 2023-04-18 NOTE — Progress Notes (Unsigned)
 ZIO AT serial # [id number] from office inventory applied to patient.

## 2023-04-18 NOTE — Patient Instructions (Addendum)
 Your Covid, Flu and RSV testing was done today - all negative  Please take all new medication as prescribed- the antibiotic  Please continue all other medications as before, and refills have been done if requested.  Please have the pharmacy call with any other refills you may need.  Please keep your appointments with your specialists as you may have planned  Please go to the LAB at the blood drawing area for the tests to be done  - as we discussed  You will be contacted by phone if any changes need to be made immediately.  Otherwise, you will receive a letter about your results with an explanation, but please check with MyChart first.

## 2023-04-19 DIAGNOSIS — R55 Syncope and collapse: Secondary | ICD-10-CM | POA: Diagnosis not present

## 2023-04-20 ENCOUNTER — Other Ambulatory Visit (INDEPENDENT_AMBULATORY_CARE_PROVIDER_SITE_OTHER)

## 2023-04-20 ENCOUNTER — Encounter: Payer: Self-pay | Admitting: Internal Medicine

## 2023-04-20 DIAGNOSIS — E559 Vitamin D deficiency, unspecified: Secondary | ICD-10-CM

## 2023-04-20 DIAGNOSIS — R739 Hyperglycemia, unspecified: Secondary | ICD-10-CM

## 2023-04-20 DIAGNOSIS — E7800 Pure hypercholesterolemia, unspecified: Secondary | ICD-10-CM | POA: Diagnosis not present

## 2023-04-20 DIAGNOSIS — E538 Deficiency of other specified B group vitamins: Secondary | ICD-10-CM | POA: Diagnosis not present

## 2023-04-20 LAB — LIPID PANEL
Cholesterol: 104 mg/dL (ref 0–200)
HDL: 65.7 mg/dL (ref >39.00)
LDL Cholesterol: 22 mg/dL (ref 0–99)
NonHDL: 38.19
Total CHOL/HDL Ratio: 2
Triglycerides: 81 mg/dL (ref 0.0–149.0)
VLDL: 16.2 mg/dL (ref 0.0–40.0)

## 2023-04-20 LAB — HEPATIC FUNCTION PANEL
ALT: 16 U/L (ref 0–53)
AST: 20 U/L (ref 0–37)
Albumin: 4.5 g/dL (ref 3.5–5.2)
Alkaline Phosphatase: 83 U/L (ref 39–117)
Bilirubin, Direct: 0.1 mg/dL (ref 0.0–0.3)
Total Bilirubin: 0.4 mg/dL (ref 0.2–1.2)
Total Protein: 7.4 g/dL (ref 6.0–8.3)

## 2023-04-20 LAB — URINALYSIS, ROUTINE W REFLEX MICROSCOPIC
Bilirubin Urine: NEGATIVE
Hgb urine dipstick: NEGATIVE
Ketones, ur: NEGATIVE
Leukocytes,Ua: NEGATIVE
Nitrite: NEGATIVE
RBC / HPF: NONE SEEN (ref 0–?)
Specific Gravity, Urine: 1.01 (ref 1.000–1.030)
Total Protein, Urine: NEGATIVE
Urine Glucose: NEGATIVE
Urobilinogen, UA: 0.2 (ref 0.0–1.0)
WBC, UA: NONE SEEN (ref 0–?)
pH: 6 (ref 5.0–8.0)

## 2023-04-20 LAB — BASIC METABOLIC PANEL
BUN: 23 mg/dL (ref 6–23)
CO2: 28 meq/L (ref 19–32)
Calcium: 9.7 mg/dL (ref 8.4–10.5)
Chloride: 105 meq/L (ref 96–112)
Creatinine, Ser: 1.03 mg/dL (ref 0.40–1.50)
GFR: 67.81 mL/min (ref >60.00)
Glucose, Bld: 105 mg/dL — ABNORMAL HIGH (ref 70–99)
Potassium: 4.2 meq/L (ref 3.5–5.1)
Sodium: 141 meq/L (ref 135–145)

## 2023-04-20 LAB — HEMOGLOBIN A1C: Hgb A1c MFr Bld: 6.1 % (ref 4.6–6.5)

## 2023-04-20 LAB — VITAMIN B12: Vitamin B-12: 1441 pg/mL — ABNORMAL HIGH (ref 211–911)

## 2023-04-20 LAB — CBC WITH DIFFERENTIAL/PLATELET
Basophils Absolute: 0 10*3/uL (ref 0.0–0.1)
Basophils Relative: 0.5 % (ref 0.0–3.0)
Eosinophils Absolute: 0.2 10*3/uL (ref 0.0–0.7)
Eosinophils Relative: 4.2 % (ref 0.0–5.0)
HCT: 37.6 % — ABNORMAL LOW (ref 39.0–52.0)
Hemoglobin: 12.7 g/dL — ABNORMAL LOW (ref 13.0–17.0)
Lymphocytes Relative: 29.2 % (ref 12.0–46.0)
Lymphs Abs: 1.3 10*3/uL (ref 0.7–4.0)
MCHC: 33.7 g/dL (ref 30.0–36.0)
MCV: 92.5 fl (ref 78.0–100.0)
Monocytes Absolute: 0.6 10*3/uL (ref 0.1–1.0)
Monocytes Relative: 13 % — ABNORMAL HIGH (ref 3.0–12.0)
Neutro Abs: 2.4 10*3/uL (ref 1.4–7.7)
Neutrophils Relative %: 53.1 % (ref 43.0–77.0)
Platelets: 297 10*3/uL (ref 150.0–400.0)
RBC: 4.07 Mil/uL — ABNORMAL LOW (ref 4.22–5.81)
RDW: 13 % (ref 11.5–15.5)
WBC: 4.6 10*3/uL (ref 4.0–10.5)

## 2023-04-20 LAB — VITAMIN D 25 HYDROXY (VIT D DEFICIENCY, FRACTURES): VITD: 69.98 ng/mL (ref 30.00–100.00)

## 2023-04-20 LAB — TSH: TSH: 4.51 u[IU]/mL (ref 0.35–5.50)

## 2023-04-20 NOTE — Progress Notes (Signed)
 The test results show that your current treatment is OK, as the tests are stable.  Please continue the same plan.  There is no other need for change of treatment or further evaluation based on these results, at this time.  thanks

## 2023-04-20 NOTE — Assessment & Plan Note (Signed)
 Lab Results  Component Value Date   VITAMINB12 >1501 (H) 11/01/2022   Now overcontrolled, to reduce oral replacement - b12 1000 mcg to m - w - f

## 2023-04-20 NOTE — Assessment & Plan Note (Signed)
 C/w likely uri possible bronichitis, viral testing neg, for zpack asd, .delsym otc prn, declines cxr for now,  to f/u any worsening symptoms or concerns

## 2023-04-20 NOTE — Assessment & Plan Note (Signed)
 Lab Results  Component Value Date   LDLCALC 31 11/01/2022   Stable, pt to continue zetia 10 mg and repatha 140 mg

## 2023-05-02 ENCOUNTER — Encounter: Payer: Self-pay | Admitting: Internal Medicine

## 2023-05-02 ENCOUNTER — Telehealth: Payer: Self-pay | Admitting: Cardiology

## 2023-05-02 ENCOUNTER — Ambulatory Visit (INDEPENDENT_AMBULATORY_CARE_PROVIDER_SITE_OTHER): Payer: Medicare Other | Admitting: Internal Medicine

## 2023-05-02 VITALS — BP 120/60 | HR 59 | Temp 98.1°F | Ht 66.0 in | Wt 149.0 lb

## 2023-05-02 DIAGNOSIS — N401 Enlarged prostate with lower urinary tract symptoms: Secondary | ICD-10-CM | POA: Diagnosis not present

## 2023-05-02 DIAGNOSIS — E538 Deficiency of other specified B group vitamins: Secondary | ICD-10-CM

## 2023-05-02 DIAGNOSIS — R55 Syncope and collapse: Secondary | ICD-10-CM

## 2023-05-02 MED ORDER — SILODOSIN 4 MG PO CAPS: 4.0000 mg | ORAL_CAPSULE | Freq: Every day | ORAL | 3 refills | Status: AC

## 2023-05-02 NOTE — Telephone Encounter (Signed)
 Spoke with [name] Browning regarding lost mailing envelope.  Sent email to [facility] at [facility] to please send patient a replacment white mailing envelope with a prepaid return USPS label.

## 2023-05-02 NOTE — Telephone Encounter (Signed)
 Wife called to say that she needs a envelope to mail the heart monitor back. Please advise

## 2023-05-02 NOTE — Assessment & Plan Note (Signed)
 Dizzy - Reduce Rapaflo dose, hydrate well

## 2023-05-02 NOTE — Assessment & Plan Note (Signed)
 On B12

## 2023-05-02 NOTE — Progress Notes (Signed)
 Subjective:  Patient ID: [name] Browning, male    DOB: 27-Sep-1940  Age: 83 y.o. MRN: [id number]  CC: Medical Management of Chronic Issues (6 mnth f/u)   HPI LENORRIS KARGER presents for a syncopal spell while being sick w/a URI C/o dizziness when standing up Dizzy when standing up at times  Outpatient Medications Prior to Visit  Medication Sig Dispense Refill   Cholecalciferol (VITAMIN D3) 50 MCG (2000 UT) capsule Take 1 capsule (2,000 Units total) by mouth daily.     cyanocobalamin 1000 MCG tablet Take 1,000 mcg by mouth daily.     Evolocumab (REPATHA SURECLICK) 140 MG/ML SOAJ INJECT 140MG  SUBCUTANEOUSLY  EVERY 2 WEEKS 6 mL 3   ezetimibe (ZETIA) 10 MG tablet Take 1 tablet (10 mg total) by mouth daily. 90 tablet 3   Multiple Vitamin tablet Take 1 tablet by mouth daily.     Multiple Vitamins-Minerals (ZINC PO) Take 1 tablet by mouth daily.     RAPAFLO 8 MG CAPS capsule Take 8 mg by mouth daily with breakfast.      No facility-administered medications prior to visit.    ROS: Review of Systems  Constitutional:  Negative for appetite change, fatigue and unexpected weight change.  HENT:  Negative for congestion, nosebleeds, sneezing, sore throat and trouble swallowing.   Eyes:  Negative for itching and visual disturbance.  Respiratory:  Negative for cough.   Cardiovascular:  Negative for chest pain, palpitations and leg swelling.  Gastrointestinal:  Negative for abdominal distention, blood in stool, diarrhea and nausea.  Genitourinary:  Negative for frequency and hematuria.  Musculoskeletal:  Negative for back pain, gait problem, joint swelling and neck pain.  Skin:  Negative for rash.  Neurological:  Positive for light-headedness. Negative for dizziness, tremors, speech difficulty, weakness and numbness.  Psychiatric/Behavioral:  Negative for agitation, dysphoric mood and sleep disturbance. The patient is not nervous/anxious.     Objective:  BP 120/60 (Patient Position:  Standing)   Pulse (!) 59   Temp 98.1 F (36.7 C) (Oral)   Ht 5\' 6"  (1.676 m)   Wt 149 lb (67.6 kg)   SpO2 95%   BMI 24.05 kg/m   BP Readings from Last 3 Encounters:  05/02/23 120/60  04/18/23 (!) 142/70  04/18/23 (!) 160/62    Wt Readings from Last 3 Encounters:  05/02/23 149 lb (67.6 kg)  04/18/23 148 lb (67.1 kg)  04/18/23 147 lb (66.7 kg)    Physical Exam Constitutional:      General: He is not in acute distress.    Appearance: He is well-developed.     Comments: NAD  Eyes:     Conjunctiva/sclera: Conjunctivae normal.     Pupils: Pupils are equal, round, and reactive to light.  Neck:     Thyroid: No thyromegaly.     Vascular: No JVD.  Cardiovascular:     Rate and Rhythm: Normal rate and regular rhythm.     Heart sounds: Normal heart sounds. No murmur heard.    No friction rub. No gallop.  Pulmonary:     Effort: Pulmonary effort is normal. No respiratory distress.     Breath sounds: Normal breath sounds. No wheezing or rales.  Chest:     Chest wall: No tenderness.  Abdominal:     General: Bowel sounds are normal. There is no distension.     Palpations: Abdomen is soft. There is no mass.     Tenderness: There is no abdominal tenderness. There is no guarding  or rebound.  Musculoskeletal:        General: No tenderness. Normal range of motion.     Cervical back: Normal range of motion.  Lymphadenopathy:     Cervical: No cervical adenopathy.  Skin:    General: Skin is warm and dry.     Findings: No rash.  Neurological:     Mental Status: He is alert and oriented to person, place, and time.     Cranial Nerves: No cranial nerve deficit.     Motor: No abnormal muscle tone.     Coordination: Coordination normal.     Gait: Gait normal.     Deep Tendon Reflexes: Reflexes are normal and symmetric.  Psychiatric:        Behavior: Behavior normal.        Thought Content: Thought content normal.        Judgment: Judgment normal.     Lab Results  Component Value  Date   WBC 4.6 04/20/2023   HGB 12.7 (L) 04/20/2023   HCT 37.6 (L) 04/20/2023   PLT 297.0 04/20/2023   GLUCOSE 105 (H) 04/20/2023   CHOL 104 04/20/2023   TRIG 81.0 04/20/2023   HDL 65.70 04/20/2023   LDLDIRECT 152.0 10/12/2018   LDLCALC 22 04/20/2023   ALT 16 04/20/2023   AST 20 04/20/2023   NA 141 04/20/2023   K 4.2 04/20/2023   CL 105 04/20/2023   CREATININE 1.03 04/20/2023   BUN 23 04/20/2023   CO2 28 04/20/2023   TSH 4.51 04/20/2023   PSA 0.78 11/01/2022   INR 0.9 12/10/2020   HGBA1C 6.1 04/20/2023    MR BRAIN/IAC W WO CONTRAST Result Date: 04/05/2022 CLINICAL DATA:  Provided history: Sensory hearing loss, bilateral. EXAM: MRI HEAD WITHOUT AND WITH CONTRAST TECHNIQUE: Multiplanar, multiecho pulse sequences of the brain and surrounding structures were obtained without and with intravenous contrast. CONTRAST:  7.5 mL Vueway intravenous contrast. COMPARISON:  Brain MRI 06/01/2016. FINDINGS: Brain: Mild generalized parenchymal atrophy. Mild multifocal T2 FLAIR hyperintense signal abnormality within the cerebral white matter and pons, nonspecific but compatible with chronic small vessel ischemic disease. No evidence of an intracranial mass. Specifically, no cerebellopontine angle or internal auditory canal mass is demonstrated. Unremarkable appearance of the 7th and 8th cranial nerves bilaterally. There is no acute infarct. No chronic intracranial blood products. No extra-axial fluid collection. No midline shift. No pathologic intracranial enhancement identified. Vascular: Maintained flow voids within the proximal large arterial vessels. Skull and upper cervical spine: No focal suspicious marrow lesion. Incompletely assessed cervical spondylosis. Ligamentous hypertrophy/pannus formation posterior to the dens, mildly narrowing the upper cervical spinal canal. Sinuses/Orbits: No orbital mass or acute orbital finding. Prior bilateral ocular lens replacement. Trace mucosal thickening within the  bilateral ethmoid and maxillary sinuses. Other: Small-volume fluid within the left mastoid air cells. IMPRESSION: 1.  No evidence of an acute intracranial abnormality. 2. No cerebellopontine angle or internal auditory canal mass. 3. No specific cause of hearing loss is identified. 4. Mild chronic small vessel ischemic changes within the cerebral white matter and pons, similar to the prior brain MRI of 06/01/2016. 5. Mild generalized parenchymal atrophy. 6. Small-volume fluid within the left mastoid air cells. Electronically Signed   By: Jackey Loge D.O.   On: 04/05/2022 18:16    Assessment & Plan:   Problem List Items Addressed This Visit     BPH (benign prostatic hyperplasia)   Dizzy - Reduce Rapaflo dose, hydrate well      Syncope, vasovagal -  Primary    New - due to URI, Rapaflo Reduce Rapaflo dose, hydrate well Return Zio patch      B12 deficiency   On B12         No orders of the defined types were placed in this encounter.     Follow-up: No follow-ups on file.  Sonda Primes, MD

## 2023-05-02 NOTE — Assessment & Plan Note (Signed)
 New - due to URI, Rapaflo Reduce Rapaflo dose, hydrate well Return Zio patch

## 2023-05-05 ENCOUNTER — Other Ambulatory Visit: Payer: Self-pay | Admitting: Cardiology

## 2023-05-05 DIAGNOSIS — E7800 Pure hypercholesterolemia, unspecified: Secondary | ICD-10-CM

## 2023-05-21 DIAGNOSIS — Z23 Encounter for immunization: Secondary | ICD-10-CM | POA: Diagnosis not present

## 2023-06-30 ENCOUNTER — Ambulatory Visit: Payer: Self-pay

## 2023-06-30 NOTE — Telephone Encounter (Signed)
-----   Message from Gaylyn Keas sent at 05/18/2023  1:27 PM EDT ----- Heart monitor showed predominantly normal sinus rhythm.  There were extra heartbeats from the top of the heart called PACs and some rare extra heartbeats from the bottom of the heart called PVCs which are benign.  He did have an episode of some dropped beats while asleep called second-degree AV block.  This is usually due to high vagal tone while sleeping.  I would like him to have a home sleep study to rule out sleep apnea as a cause.  Please get an Itamar  sleep study

## 2023-06-30 NOTE — Telephone Encounter (Signed)
 2 phone attempts and 1 MC attempt to give heart monitor results and to set patient up with home sleep study. Letter sent.

## 2023-07-13 NOTE — Telephone Encounter (Signed)
 Pts wife calling to receive results. Please advise

## 2023-07-15 ENCOUNTER — Telehealth: Payer: Self-pay

## 2023-07-15 DIAGNOSIS — G479 Sleep disorder, unspecified: Secondary | ICD-10-CM

## 2023-07-15 DIAGNOSIS — G4733 Obstructive sleep apnea (adult) (pediatric): Secondary | ICD-10-CM

## 2023-07-15 DIAGNOSIS — R9431 Abnormal electrocardiogram [ECG] [EKG]: Secondary | ICD-10-CM

## 2023-07-15 NOTE — Telephone Encounter (Signed)
 Spouse (DPR) calling in to get heart monitor results after receiving letter. Explained that Heart monitor showed predominantly normal sinus rhythm.  There were extra heartbeats from the top of the heart called PACs and some rare extra heartbeats from the bottom of the heart called PVCs which are benign.  He did have an episode of some dropped beats while asleep called second-degree AV block.  Explained this may be related to his history of sleep apnea. Spouse states patient only uses a mouth guard to help his sleep apnea. Advised Dr. Micael Adas would like a home sleep study, spouse agrees to plan.  Forwarded to sleep studies team, order placed for itamar.

## 2023-07-19 NOTE — Telephone Encounter (Signed)
 Stop bang is in.

## 2023-07-19 NOTE — Telephone Encounter (Signed)
    Snoring-no Tired-yes Observed apnea-no High blood pressure-no BMI-no Age- yes older than 50 Neck circumference: 16 in Gender-Male  STOP-BANG score 4

## 2023-07-20 DIAGNOSIS — Z961 Presence of intraocular lens: Secondary | ICD-10-CM | POA: Diagnosis not present

## 2023-07-20 DIAGNOSIS — H35433 Paving stone degeneration of retina, bilateral: Secondary | ICD-10-CM | POA: Diagnosis not present

## 2023-07-29 NOTE — Telephone Encounter (Signed)
 Spoke with wife patient will come into the office on 6/16 around 1130 to pick up his Itamar

## 2023-08-01 ENCOUNTER — Encounter: Payer: Self-pay | Admitting: Internal Medicine

## 2023-08-01 ENCOUNTER — Ambulatory Visit (INDEPENDENT_AMBULATORY_CARE_PROVIDER_SITE_OTHER): Admitting: Internal Medicine

## 2023-08-01 ENCOUNTER — Ambulatory Visit: Attending: Internal Medicine

## 2023-08-01 VITALS — BP 140/70 | HR 58 | Temp 97.9°F | Ht 66.0 in | Wt 149.0 lb

## 2023-08-01 DIAGNOSIS — G4730 Sleep apnea, unspecified: Secondary | ICD-10-CM | POA: Insufficient documentation

## 2023-08-01 DIAGNOSIS — E538 Deficiency of other specified B group vitamins: Secondary | ICD-10-CM

## 2023-08-01 DIAGNOSIS — R55 Syncope and collapse: Secondary | ICD-10-CM

## 2023-08-01 DIAGNOSIS — G4733 Obstructive sleep apnea (adult) (pediatric): Secondary | ICD-10-CM

## 2023-08-01 DIAGNOSIS — I10 Essential (primary) hypertension: Secondary | ICD-10-CM

## 2023-08-01 NOTE — Assessment & Plan Note (Addendum)
 On NAS diet White coat HTN Nl BP at home

## 2023-08-01 NOTE — Assessment & Plan Note (Addendum)
  Home telemetry per Dr Micael Adas: There were extra heartbeats from the top of the heart called PACs and some rare extra heartbeats from the bottom of the heart called PVCs which are benign.  He did have an episode of some dropped beats while asleep called second-degree AV block.  This is usually due to high vagal tone while sleeping.  I would like him to have a home sleep study to rule out sleep apnea as a cause.  Please get an Itamar  sleep study  Sleep test this week

## 2023-08-01 NOTE — Assessment & Plan Note (Signed)
 Using a mouthguard Sleep test - pending

## 2023-08-01 NOTE — Assessment & Plan Note (Signed)
 On B12

## 2023-08-01 NOTE — Progress Notes (Signed)
 Subjective:  Patient ID: [name] Browning, male    DOB: 01-07-1941  Age: 83 y.o. MRN: [id number]  CC: Medical Management of Chronic Issues (Pt was to discuss Heart monitoring study otherwise feel good.)   HPI [name] Browning presents for syncope, no palpitations; OSA - using a mouthguard  Home telemetry - per Dr Micael Adas: There were extra heartbeats from the top of the heart called PACs and some rare extra heartbeats from the bottom of the heart called PVCs which are benign.  He did have an episode of some dropped beats while asleep called second-degree AV block.  This is usually due to high vagal tone while sleeping.  I would like him to have a home sleep study to rule out sleep apnea as a cause.  Please get an Itamar  sleep study    He is here w/his wife Outpatient Medications Prior to Visit  Medication Sig Dispense Refill   Cholecalciferol (VITAMIN D3) 50 MCG (2000 UT) capsule Take 1 capsule (2,000 Units total) by mouth daily.     cyanocobalamin  1000 MCG tablet Take 1,000 mcg by mouth daily.     Evolocumab  (REPATHA  SURECLICK) 140 MG/ML SOAJ INJECT 140MG  SUBCUTANEOUSLY EVERY 2 WEEKS 6 mL 1   ezetimibe  (ZETIA ) 10 MG tablet Take 1 tablet (10 mg total) by mouth daily. 90 tablet 3   Multiple Vitamin tablet Take 1 tablet by mouth daily.     Multiple Vitamins-Minerals (ZINC  PO) Take 1 tablet by mouth daily.     silodosin  (RAPAFLO ) 4 MG CAPS capsule Take 1 capsule (4 mg total) by mouth daily with breakfast. 90 capsule 3   No facility-administered medications prior to visit.    ROS: Review of Systems  Constitutional:  Negative for appetite change, fatigue and unexpected weight change.  HENT:  Negative for congestion, nosebleeds, sneezing, sore throat and trouble swallowing.   Eyes:  Negative for itching and visual disturbance.  Respiratory:  Negative for cough.   Cardiovascular:  Negative for chest pain, palpitations and leg swelling.  Gastrointestinal:  Negative for abdominal  distention, blood in stool, diarrhea and nausea.  Genitourinary:  Negative for frequency and hematuria.  Musculoskeletal:  Negative for back pain, gait problem, joint swelling and neck pain.  Skin:  Negative for rash.  Neurological:  Negative for dizziness, tremors, speech difficulty and weakness.  Hematological:  Does not bruise/bleed easily.  Psychiatric/Behavioral:  Negative for agitation, dysphoric mood, sleep disturbance and suicidal ideas. The patient is not nervous/anxious.     Objective:  BP (!) 140/70   Pulse (!) 58   Temp 97.9 F (36.6 C) (Oral)   Ht 5' 6 (1.676 m)   Wt 149 lb (67.6 kg)   SpO2 95%   BMI 24.05 kg/m   BP Readings from Last 3 Encounters:  08/01/23 (!) 140/70  05/02/23 120/60  04/18/23 (!) 142/70    Wt Readings from Last 3 Encounters:  08/01/23 149 lb (67.6 kg)  05/02/23 149 lb (67.6 kg)  04/18/23 148 lb (67.1 kg)    Physical Exam Constitutional:      General: He is not in acute distress.    Appearance: Normal appearance. He is well-developed.     Comments: NAD   Eyes:     Conjunctiva/sclera: Conjunctivae normal.     Pupils: Pupils are equal, round, and reactive to light.   Neck:     Thyroid : No thyromegaly.     Vascular: No JVD.   Cardiovascular:     Rate and Rhythm:  Normal rate and regular rhythm.     Heart sounds: Normal heart sounds. No murmur heard.    No friction rub. No gallop.  Pulmonary:     Effort: Pulmonary effort is normal. No respiratory distress.     Breath sounds: Normal breath sounds. No wheezing or rales.  Chest:     Chest wall: No tenderness.  Abdominal:     General: Bowel sounds are normal. There is no distension.     Palpations: Abdomen is soft. There is no mass.     Tenderness: There is no abdominal tenderness. There is no guarding or rebound.   Musculoskeletal:        General: No tenderness. Normal range of motion.     Cervical back: Normal range of motion.  Lymphadenopathy:     Cervical: No cervical  adenopathy.   Skin:    General: Skin is warm and dry.     Findings: No rash.   Neurological:     Mental Status: He is alert and oriented to person, place, and time.     Cranial Nerves: No cranial nerve deficit.     Motor: No abnormal muscle tone.     Coordination: Coordination normal.     Gait: Gait normal.     Deep Tendon Reflexes: Reflexes are normal and symmetric.   Psychiatric:        Behavior: Behavior normal.        Thought Content: Thought content normal.        Judgment: Judgment normal.     Lab Results  Component Value Date   WBC 4.6 04/20/2023   HGB 12.7 (L) 04/20/2023   HCT 37.6 (L) 04/20/2023   PLT 297.0 04/20/2023   GLUCOSE 105 (H) 04/20/2023   CHOL 104 04/20/2023   TRIG 81.0 04/20/2023   HDL 65.70 04/20/2023   LDLDIRECT 152.0 10/12/2018   LDLCALC 22 04/20/2023   ALT 16 04/20/2023   AST 20 04/20/2023   NA 141 04/20/2023   K 4.2 04/20/2023   CL 105 04/20/2023   CREATININE 1.03 04/20/2023   BUN 23 04/20/2023   CO2 28 04/20/2023   TSH 4.51 04/20/2023   PSA 0.78 11/01/2022   INR 0.9 12/10/2020   HGBA1C 6.1 04/20/2023    MR BRAIN/IAC W WO CONTRAST Result Date: 04/05/2022 CLINICAL DATA:  Provided history: Sensory hearing loss, bilateral. EXAM: MRI HEAD WITHOUT AND WITH CONTRAST TECHNIQUE: Multiplanar, multiecho pulse sequences of the brain and surrounding structures were obtained without and with intravenous contrast. CONTRAST:  7.5 mL Vueway  intravenous contrast. COMPARISON:  Brain MRI 06/01/2016. FINDINGS: Brain: Mild generalized parenchymal atrophy. Mild multifocal T2 FLAIR hyperintense signal abnormality within the cerebral white matter and pons, nonspecific but compatible with chronic small vessel ischemic disease. No evidence of an intracranial mass. Specifically, no cerebellopontine angle or internal auditory canal mass is demonstrated. Unremarkable appearance of the 7th and 8th cranial nerves bilaterally. There is no acute infarct. No chronic  intracranial blood products. No extra-axial fluid collection. No midline shift. No pathologic intracranial enhancement identified. Vascular: Maintained flow voids within the proximal large arterial vessels. Skull and upper cervical spine: No focal suspicious marrow lesion. Incompletely assessed cervical spondylosis. Ligamentous hypertrophy/pannus formation posterior to the dens, mildly narrowing the upper cervical spinal canal. Sinuses/Orbits: No orbital mass or acute orbital finding. Prior bilateral ocular lens replacement. Trace mucosal thickening within the bilateral ethmoid and maxillary sinuses. Other: Small-volume fluid within the left mastoid air cells. IMPRESSION: 1.  No evidence of an acute intracranial abnormality.  2. No cerebellopontine angle or internal auditory canal mass. 3. No specific cause of hearing loss is identified. 4. Mild chronic small vessel ischemic changes within the cerebral white matter and pons, similar to the prior brain MRI of 06/01/2016. 5. Mild generalized parenchymal atrophy. 6. Small-volume fluid within the left mastoid air cells. Electronically Signed   By: Bascom Lily D.O.   On: 04/05/2022 18:16    Assessment & Plan:   Problem List Items Addressed This Visit     Essential hypertension   On NAS diet White coat HTN Nl BP at home      Syncope, vasovagal - Primary     Home telemetry per Dr Micael Adas: There were extra heartbeats from the top of the heart called PACs and some rare extra heartbeats from the bottom of the heart called PVCs which are benign.  He did have an episode of some dropped beats while asleep called second-degree AV block.  This is usually due to high vagal tone while sleeping.  I would like him to have a home sleep study to rule out sleep apnea as a cause.  Please get an Itamar  sleep study  Sleep test this week      B12 deficiency   On B12      Sleep apnea   Using a mouthguard Sleep test - pending         No orders of the defined  types were placed in this encounter.     Follow-up: Return in about 6 months (around 01/31/2024) for a follow-up visit.  Anitra Barn, MD

## 2023-08-01 NOTE — Telephone Encounter (Signed)
 Patient agreement reviewed and signed on 08/01/2023.  WatchPAT issued to patient on 08/01/2023 by Nathalie Baize. Patient is aware of activation PIN # [id number]. Patient profile initialized in Cloud[employer] on 08/01/2023 by Jenise Mixer. Device serial number: [id number]  Please list Reason for Call as Advice Only and type WatchPAT issued to patient in the comment box.

## 2023-08-01 NOTE — Patient Instructions (Signed)
 Isobel Marie, RN 06/23/2023 10:48 AM EDT     Left voice message to call back 5/8   Luwana Salvo, RN 05/18/2023  3:39 PM EDT     Left message to callback for results.   Jacqueline Matsu, MD 05/18/2023  1:27 PM EDT     Heart monitor showed predominantly normal sinus rhythm.  There were extra heartbeats from the top of the heart called PACs and some rare extra heartbeats from the bottom of the heart called PVCs which are benign.  He did have an episode of some dropped beats while asleep called second-degree AV block.  This is usually due to high vagal tone while sleeping.  I would like him to have a home sleep study to rule out sleep apnea as a cause.  Please get an Itamar  sleep study         LONG TERM MONITOR-LIVE TELEMETRY (3-14 DAYS): Patient Communication   Add Comments   Seen   Hi [name] Browning,  Our office has been trying to reach you about your heart monitor results. Please give our office a call at [phone]. Ardelia Beau, RN  Written by Cherylyn Cos, RN on 06/30/2023 12:39 PM EDT Seen by patient [name] Browning on 07/25/2023 10:29 AM MyChart Results Release  MyChart Status: Active  Results Release   Vitals  Height Weight BMI (Calculated)  5' 6 (1.676 m) 149 lb (67.6 kg) 24.06   Study Highlights      Predominant rhythm was normal sinus rhythm with an average heart rate of 60 bpm and ranged from 51 to 116 bpm   Intermittent second-degree type I A-V block during sleep   Occasional PACs and rare atrial couplets and triplets   Rare PVCs     Patch Wear Time:  9 days and 6 hours (2025-03-03T11:07:48-0500 to 2025-03-12T18:10:17-0400)   Patient had a min HR of 26 bpm, max HR of 116 bpm, and avg HR of 60 bpm. Predominant underlying rhythm was Sinus Rhythm. First Degree AV Block was present. Second Degree AV Block-Mobitz I (Wenckebach) was present. Isolated SVEs were occasional (1.6%,  9724), SVE Couplets were rare (<1.0%, 934), and SVE Triplets were rare (<1.0%, 364).  Isolated VEs were rare (<1.0%), and no VE Couplets or VE Triplets were present. Inverted QRS complexes possibly due to inverted placement of device.   Resulted by:  Signed Date/Time  Phone Pager  Jacqueline Matsu 05/16/2023  3:49 PM [phone]    Report approved and finalized on 05/16/2023 1549

## 2023-08-02 ENCOUNTER — Encounter (INDEPENDENT_AMBULATORY_CARE_PROVIDER_SITE_OTHER): Payer: Self-pay | Admitting: Cardiology

## 2023-08-02 ENCOUNTER — Ambulatory Visit: Attending: Cardiology

## 2023-08-02 DIAGNOSIS — G4733 Obstructive sleep apnea (adult) (pediatric): Secondary | ICD-10-CM

## 2023-08-02 DIAGNOSIS — R9431 Abnormal electrocardiogram [ECG] [EKG]: Secondary | ICD-10-CM

## 2023-08-02 DIAGNOSIS — G479 Sleep disorder, unspecified: Secondary | ICD-10-CM

## 2023-08-03 ENCOUNTER — Telehealth: Payer: Self-pay

## 2023-08-03 NOTE — Procedures (Signed)
   SLEEP STUDY REPORT Patient Information Study Date: 08/02/2023 Patient Name: [name] Browning Patient ID: [id number] Birth Date: 1940/09/17 Age: 83 Gender: Male BMI: 33.7 (W=150 lb, H=4' 8'') Stopbang: 4 Referring Physician: Gaylyn Keas, MD  TEST DESCRIPTION: Home sleep apnea testing was completed using the WatchPat, a Type 1 device, utilizing  peripheral arterial tonometry (PAT), chest movement, actigraphy, pulse oximetry, pulse rate, body position and snore.  AHI was calculated with apnea and hypopnea using valid sleep time as the denominator. RDI includes apneas,  hypopneas, and RERAs. The data acquired and the scoring of sleep and all associated events were performed in  accordance with the recommended standards and specifications as outlined in the AASM Manual for the Scoring of  Sleep and Associated Events 2.2.0 (2015).   FINDINGS:   1. Mild Obstructive Sleep Apnea with AHI 8.3/hr.   2. No Central Sleep Apnea with pAHIc 2/hr.   3. Oxygen desaturations as low as 91%.   4. Moderate snoring was present. O2 sats were < 88% for 0 min.   5. Total sleep time was 9 hrs and 0 min.   6. 23.8% of total sleep time was spent in REM sleep.   7. Shortened sleep onset latency at 9 min.   8. Prolonged REM sleep onset latency at 112 min.   9. Total awakenings were 4.  10. Arrhythmia detection: None  DIAGNOSIS: Mild Obstructive Sleep Apnea (G47.33)  RECOMMENDATIONS: 1. Clinical correlation of these findings is necessary. The decision to treat obstructive sleep apnea (OSA) is usually  based on the presence of apnea symptoms or the presence of associated medical conditions such as Hypertension,  Congestive Heart Failure, Atrial Fibrillation or Obesity. The most common symptoms of OSA are snoring, gasping for  breath while sleeping, daytime sleepiness and fatigue.  2. Initiating apnea therapy is recommended given the presence of symptoms and/or associated conditions.  Recommend proceeding  with one of the following:  a. Auto-CPAP therapy with a pressure range of 5-20cm H2O.  b. An oral appliance (OA) that can be obtained from certain dentists with expertise in sleep medicine. These are  primarily of use in non-obese patients with mild and moderate disease.  c. An ENT consultation which may be useful to look for specific causes of obstruction and possible treatment  options.  d. If patient is intolerant to PAP therapy, consider referral to ENT for evaluation for hypoglossal nerve stimulator.  3. Close follow-up is necessary to ensure success with CPAP or oral appliance therapy for maximum benefit . 4. A follow-up oximetry study on CPAP is recommended to assess the adequacy of therapy and determine the need  for supplemental oxygen or the potential need for Bi-level therapy. An arterial blood gas to determine the adequacy of  baseline ventilation and oxygenation should also be considered. 5. Healthy sleep recommendations include: adequate nightly sleep (normal 7-9 hrs/night), avoidance of caffeine after  noon and alcohol near bedtime, and maintaining a sleep environment that is cool, dark and quiet. 6. Weight loss for overweight patients is recommended. Even modest amounts of weight loss can significantly  improve the severity of sleep apnea. 7. Snoring recommendations include: weight loss where appropriate, side sleeping, and avoidance of alcohol before  bed. 8. Operation of motor vehicle should be avoided when sleepy.  Signature: Gaylyn Keas, MD; [facility]; Diplomat, American Board of Sleep  Medicine Electronically Signed: 08/03/2023 9:41:32 AM

## 2023-08-03 NOTE — Telephone Encounter (Signed)
 Left VM with callback number for patient to receive sleep study results and recommendations.

## 2023-08-03 NOTE — Telephone Encounter (Signed)
-----   Message from Gaylyn Keas sent at 08/03/2023  9:43 AM EDT ----- Please let patient know that they have sleep apnea and recommend treating with CPAP.  Please order an auto CPAP from 4-15cm H2O with heated humidity and mask of choice.  Order overnight pulse ox on CPAP.  Followup with me in 6 weeks.

## 2023-08-04 NOTE — Telephone Encounter (Signed)
 Spoke with patient's wife, per DPR. Notified of sleep study results and recommendations. Patient's wife stated patient has an oral device for several years. Dr. Adelaide Holy requesting repeat sleep study for new oral device. All questions were answered and patient verbalized understanding. Sleep study faxed to Dr. Laurice Pope office.

## 2023-08-08 ENCOUNTER — Ambulatory Visit: Payer: Medicare Other

## 2023-08-10 ENCOUNTER — Telehealth: Payer: Self-pay

## 2023-08-10 NOTE — Telephone Encounter (Unsigned)
 Copied from CRM [id number]. Topic: Appointments - Scheduling Inquiry for Clinic >> Aug 10, 2023  2:10 PM Berneda FALCON wrote: Reason for CRM: Wife Dorothyann states they do not want to keep getting calls about virtual appts and they do not want to reschedule anything that is virtual. Wife requests that we please stop calling and trying to reschedule that, they do not wish to do so.States the one he has in September is just fine.

## 2023-09-19 DIAGNOSIS — M25561 Pain in right knee: Secondary | ICD-10-CM | POA: Diagnosis not present

## 2023-09-19 DIAGNOSIS — M1711 Unilateral primary osteoarthritis, right knee: Secondary | ICD-10-CM | POA: Diagnosis not present

## 2023-09-23 ENCOUNTER — Telehealth: Payer: Self-pay

## 2023-09-23 DIAGNOSIS — I779 Disorder of arteries and arterioles, unspecified: Secondary | ICD-10-CM

## 2023-09-23 NOTE — Telephone Encounter (Signed)
 Notes from Dr. Melba at [facility] Sleep and TMJ solutions reviewed, Dr. Shlomo recommends carotid dopplers. MC to patient, order placed.

## 2023-09-28 ENCOUNTER — Ambulatory Visit: Payer: Self-pay | Admitting: Cardiology

## 2023-09-28 ENCOUNTER — Ambulatory Visit (HOSPITAL_COMMUNITY)
Admission: RE | Admit: 2023-09-28 | Discharge: 2023-09-28 | Disposition: A | Discharge status: Home / Self Care | Source: Ambulatory Visit | Attending: Cardiology | Admitting: Cardiology

## 2023-09-28 ENCOUNTER — Encounter: Payer: Self-pay | Admitting: Cardiology

## 2023-09-28 DIAGNOSIS — I779 Disorder of arteries and arterioles, unspecified: Secondary | ICD-10-CM | POA: Diagnosis not present

## 2023-09-28 DIAGNOSIS — I6521 Occlusion and stenosis of right carotid artery: Secondary | ICD-10-CM | POA: Diagnosis not present

## 2023-09-28 DIAGNOSIS — I6523 Occlusion and stenosis of bilateral carotid arteries: Secondary | ICD-10-CM

## 2023-10-03 DIAGNOSIS — H903 Sensorineural hearing loss, bilateral: Secondary | ICD-10-CM | POA: Diagnosis not present

## 2023-10-03 DIAGNOSIS — H6123 Impacted cerumen, bilateral: Secondary | ICD-10-CM | POA: Diagnosis not present

## 2023-10-10 DIAGNOSIS — M25561 Pain in right knee: Secondary | ICD-10-CM | POA: Diagnosis not present

## 2023-10-10 DIAGNOSIS — M1711 Unilateral primary osteoarthritis, right knee: Secondary | ICD-10-CM | POA: Diagnosis not present

## 2023-10-21 ENCOUNTER — Other Ambulatory Visit: Payer: Self-pay | Admitting: Cardiology

## 2023-10-21 DIAGNOSIS — E7800 Pure hypercholesterolemia, unspecified: Secondary | ICD-10-CM

## 2023-10-24 DIAGNOSIS — M1711 Unilateral primary osteoarthritis, right knee: Secondary | ICD-10-CM | POA: Diagnosis not present

## 2023-10-24 DIAGNOSIS — M6281 Muscle weakness (generalized): Secondary | ICD-10-CM | POA: Diagnosis not present

## 2023-10-26 DIAGNOSIS — M6281 Muscle weakness (generalized): Secondary | ICD-10-CM | POA: Diagnosis not present

## 2023-10-26 DIAGNOSIS — M1711 Unilateral primary osteoarthritis, right knee: Secondary | ICD-10-CM | POA: Diagnosis not present

## 2023-10-31 ENCOUNTER — Ambulatory Visit: Admitting: Internal Medicine

## 2023-11-01 NOTE — Progress Notes (Unsigned)
 Office Note     CC: Bilateral carotid artery stenosis Requesting Provider:  Shlomo Wilbert SAUNDERS, MD  HPI: [name] Browning is a 83 y.o. (Jun 30, 1940) male presenting at the request of .Plotnikov, [name], MD asymptomatic bilateral carotid artery stenosis.  On exam, Signe was doing well, accompanied by his wife.  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  Jim's carotid disease was first picked up on dental x-ray.  He denies symptoms of TIA, stroke, amaurosis  The pt is not on a statin for cholesterol management - repatha  due to myalgias The pt is nto on a daily aspirin .   Other AC:  - The pt is  on medication for hypertension.   The pt is not diabetic.  Tobacco hx:  -  Past Medical History:  Diagnosis Date   Acute bronchitis 08/11/2016   6/18  H/o asthma as a child...   ADD (attention deficit disorder)    Anemia in chronic illness 06/15/2013   Followed as Primary Care Patient/ [facility]/ [place] - Fe studies ok 06/15/13 > stool cards received 06/27/2013 > neg x 5> down to 11.4 08/23/13  > referred for hematology 09/04/2013> Lonn felt it was due to low Testosterone   Resolved as of 08/14/2015         Anxiety    Asthma    AS A CHILD   Ataxia 06/07/2016   2018 poss age related/meds   Atrial flutter (HCC)    a. s/p RFCA 07/2011.  Xarelto  d/c 08/2011.   Bladder neck obstruction 10/04/2016   Dr Ottelin Rapaflo    Carotid artery disease [facility])    Carotid Doppler showed 40 to 59% right ICA stenosis but on the higher end and possibly 60 to 79% and 1 to 39% left ICA stenosis by dopplers 09/2023   Cataracts, bilateral    Colon polyps 10/04/2016   Dr Teressa Due colon 02/2019   Conjunctivitis 09/14/2013   Cough 02/19/2014   DgEs   01/23/16 Unremarkable esophagram. Allergy  profile 04/05/2016 >  Eos 0.4 /  IgE  284  RAST pos grass/trees/dust > rec allergy  eval     Depression    Diarrhea 02/11/2017   Disturbance in sleep behavior 04/30/2010    Trazodone   Lorazepam prn   DIZZINESS 01/24/2008   Qualifier: Diagnosis of  By: Orlie NP, Tammy     DJD (degenerative joint disease) of knee    Drug-induced memory loss (HCC) 02/22/2014   D/c statin 01/2014 > improved  D/c Trintellix Dr Donita   Dyspnea 06/18/2011   Dysrhythmia    hx a-flutter with successful ablation 2013   Essential hypertension 06/18/2011   NAS diet ASA - unable to use due to h/o AVM bleeding - colon 2003   FATIGUE, ACUTE 03/23/2007   Qualifier: Diagnosis of  By: Darlean MD, Ozell KATHEE    GERD (gastroesophageal reflux disease)    Heart murmur    a. 06/2011 Echo: 65-70%, mild MR   History of lower GI bleeding 09/26/2012   Followed in GI clinic/ [facility]/ [place]  - colonoscopy 05/22/2001  And 06/14/11 > c/w hemorrhoids and cecal avm    Hyperlipidemia    Hyperlipidemia LDL goal <130 03/22/2007   Followed as Primary Care Patient/ [facility]/ [place]   Zetia     S/P cardiac cath    a. in 1990's - Dr. Court -  WNL   Scalp laceration 11/20/2014   11/01/14 > staples removed 11/18/2014     Sleep apnea    could not tolerate cpap, uses mouth guard now   TESTICULAR HYPOFUNCTION 09/15/2009   F/u urology on testosterone  gel      Past Surgical History:  Procedure Laterality Date   ATRIAL FLUTTER ABLATION N/A 08/06/2011   Procedure: ATRIAL FLUTTER ABLATION;  Surgeon: Elspeth JAYSON Sage, MD;  Location: [facility];  Service: Cardiovascular;  Laterality: N/A;   COLONOSCOPY  02/27/2016   LESION REMOVAL N/A 08/02/2014   Procedure: EXCISION UPPER LIP VASCULAR ANOMALY ;  Surgeon: Earlis Ranks, MD;  Location: [facility];  Service: Plastics;  Laterality: N/A;   MASTOIDECTOMY     POLYPECTOMY     TONSILLECTOMY     TOTAL KNEE ARTHROPLASTY Left 12/19/2020   Procedure: TOTAL KNEE ARTHROPLASTY;  Surgeon: Yvone Rush, MD;  Location: [facility];  Service: Orthopedics;  Laterality: Left;    WRIST FRACTURE SURGERY  2007   left    Social History   Socioeconomic History   Marital status: Married    Spouse name: Not on file   Number of children: Not on file   Years of education: Not on file   Highest education level: Not on file  Occupational History   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  Tobacco Use   Smoking status: Former    Current packs/day: 0.00    Average packs/day: 3.0 packs/day for 7.0 years (21.0 ttl pk-yrs)    Types: Cigarettes    Start date: 1963    Quit date: 02/16/1968    Years since quitting: 55.7   Smokeless tobacco: Never  Vaping Use   Vaping status: Never Used  Substance and Sexual Activity   Alcohol use: Not Currently   Drug use: No   Sexual activity: Yes  Other Topics Concern   Not on file  Social History Narrative   Not on file   Social Drivers of Health   Financial Resource Strain: Low Risk  (07/29/2022)   Overall Financial Resource Strain (CARDIA)    Difficulty of Paying Living Expenses: Not hard at all  Food Insecurity: Low Risk  (11/29/2022)   Received from [facility]   Hunger Vital Sign    Within the past 12 months, you worried that your food would run out before you got money to buy more: Never true    Within the past 12 months, the food you bought just didn't last and you didn't have money to get more. : Never true  Transportation Needs: No Transportation Needs (11/29/2022)   Received from [facility]    In the past 12 months, has lack of reliable transportation kept you from medical appointments, meetings, work or from getting things needed for daily living? : No  Physical Activity: Sufficiently Active (07/29/2022)   Exercise Vital Sign    Days of Exercise per Week: 7 days    Minutes of Exercise per Session: 60 min  Stress: No Stress Concern Present (07/29/2022)   [employer] of Occupational Health - Occupational Stress Questionnaire    Feeling of Stress : Not at all  Social  Connections: Patient Declined (07/29/2022)   Social Connection and Isolation Panel    Frequency of Communication with Friends and Family: Patient declined    Frequency of Social Gatherings with Friends and Family: Patient declined    Attends Religious Services: Patient declined    Active Member of Clubs or  Organizations: Patient declined    Attends Banker Meetings: Patient declined    Marital Status: Patient declined  Intimate Partner Violence: Not At Risk (07/29/2022)   Humiliation, Afraid, Rape, and Kick questionnaire    Fear of Current or Ex-Partner: No    Emotionally Abused: No    Physically Abused: No    Sexually Abused: No    Family History  Problem Relation Age of Onset   Heart disease Mother    Leukemia Mother    Prostate cancer Father    Stroke Father    Hypertension Brother    Prostate cancer Brother    Colon cancer Neg Hx    Stomach cancer Neg Hx    Heart attack Neg Hx    Esophageal cancer Neg Hx    Rectal cancer Neg Hx    Colon polyps Neg Hx     Current Outpatient Medications  Medication Sig Dispense Refill   Cholecalciferol (VITAMIN D3) 50 MCG (2000 UT) capsule Take 1 capsule (2,000 Units total) by mouth daily.     cyanocobalamin  1000 MCG tablet Take 1,000 mcg by mouth daily.     Evolocumab  (REPATHA  SURECLICK) 140 MG/ML SOAJ INJECT 140MG  SUBCUTANEOUSLY EVERY 2 WEEKS 6 mL 1   ezetimibe  (ZETIA ) 10 MG tablet Take 1 tablet (10 mg total) by mouth daily. 90 tablet 3   Multiple Vitamin tablet Take 1 tablet by mouth daily.     Multiple Vitamins-Minerals (ZINC  PO) Take 1 tablet by mouth daily.     silodosin  (RAPAFLO ) 4 MG CAPS capsule Take 1 capsule (4 mg total) by mouth daily with breakfast. 90 capsule 3   No current facility-administered medications for this visit.    Allergies  Allergen Reactions   Crestor  [Rosuvastatin ]     Memory issues   Trintellix [Vortioxetine]     Memory loss   Zocor  [Simvastatin ]     Memory issues     REVIEW OF  SYSTEMS:   [X]  denotes positive finding, [ ]  denotes negative finding Cardiac  Comments:  Chest pain or chest pressure:    Shortness of breath upon exertion:    Short of breath when lying flat:    Irregular heart rhythm:        Vascular    Pain in calf, thigh, or hip brought on by ambulation:    Pain in feet at night that wakes you up from your sleep:     Blood clot in your veins:    Leg swelling:         Pulmonary    Oxygen at home:    Productive cough:     Wheezing:         Neurologic    Sudden weakness in arms or legs:     Sudden numbness in arms or legs:     Sudden onset of difficulty speaking or slurred speech:    Temporary loss of vision in one eye:     Problems with dizziness:         Gastrointestinal    Blood in stool:     Vomited blood:         Genitourinary    Burning when urinating:     Blood in urine:        Psychiatric    Major depression:         Hematologic    Bleeding problems:    Problems with blood clotting too easily:        Skin    Rashes  or ulcers:        Constitutional    Fever or chills:      PHYSICAL EXAMINATION:  There were no vitals filed for this visit.  General:  WDWN in NAD; vital signs documented above Gait: Not observed HENT: WNL, normocephalic Pulmonary: normal non-labored breathing , without wheezing Cardiac: regular HR Skin: without rashes Vascular Exam/Pulses:  Right Left  Radial 2+ (normal) 2+ (normal)                       Extremities: without ischemic changes, without Gangrene , without cellulitis; without open wounds;  Left-sided knee replacement, right sided knee brace Musculoskeletal: no muscle wasting or atrophy  Neurologic: A&O X 3;  No focal weakness or paresthesias are detected Psychiatric:  The pt has Normal affect.   Non-Invasive Vascular Imaging:   Right Carotid Findings:  +----------+--------+--------+--------+------------------+-----------------  +           PSV cm/sEDV  cm/sStenosisPlaque DescriptionComments            +----------+--------+--------+--------+------------------+-----------------  +  CCA Prox  81      12                                                    +----------+--------+--------+--------+------------------+-----------------  +  CCA Mid   89      16              heterogenous                          +----------+--------+--------+--------+------------------+-----------------  +  CCA Distal85      17                                                    +----------+--------+--------+--------+------------------+-----------------  +  ICA Prox  278     62      40-59%  heterogenous      borderline  60-79%  +----------+--------+--------+--------+------------------+-----------------  +  ICA Mid   126     27                                                    +----------+--------+--------+--------+------------------+-----------------  +  ICA Distal93      26                                                    +----------+--------+--------+--------+------------------+-----------------  +  ECA      112     7                                                     +----------+--------+--------+--------+------------------+-----------------  +   +----------+--------+-------+----------------+-------------------+  PSV cm/sEDV cmsDescribe        Arm Pressure (mmHG)  +----------+--------+-------+----------------+-------------------+  [id number]    0      Multiphasic, [id number]                  +----------+--------+-------+----------------+-------------------+   +---------+--------+--+--------+-+---------+  VertebralPSV cm/s60EDV cm/s9Antegrade  +---------+--------+--+--------+-+---------+      Left Carotid Findings:  +----------+--------+--------+--------+-------------------------+--------+           PSV cm/sEDV cm/sStenosisPlaque Description       Comments   +----------+--------+--------+--------+-------------------------+--------+  CCA Prox  88      13                                                 +----------+--------+--------+--------+-------------------------+--------+  CCA Mid   81      15                                                 +----------+--------+--------+--------+-------------------------+--------+  CCA Distal82      19              heterogenous and calcific          +----------+--------+--------+--------+-------------------------+--------+  ICA Prox  154     18      1-39%   heterogenous                       +----------+--------+--------+--------+-------------------------+--------+  ICA Mid   138     33                                                 +----------+--------+--------+--------+-------------------------+--------+  ICA Distal100     26                                                 +----------+--------+--------+--------+-------------------------+--------+  ECA      243     5               heterogenous                       +----------+--------+--------+--------+-------------------------+--------+   +----------+--------+--------+----------------+-------------------+           PSV cm/sEDV cm/sDescribe        Arm Pressure (mmHG)  +----------+--------+--------+----------------+-------------------+  [id number]    0       Multiphasic, [id number]                  +----------+--------+--------+----------------+-------------------+   +---------+--------+--+--------+-+---------+  VertebralPSV cm/s21EDV cm/s5Antegrade  +---------+--------+--+--------+-+---------+         Summary:  Right Carotid: Velocities in the right ICA are consistent with a 40-59%                 stenosis. Borderline 60-79%.   Left Carotid: Velocities in the left ICA are consistent with a 1-39%  stenosis.   Vertebrals: Bilateral vertebral arteries demonstrate antegrade flow.   Subclavians: Normal flow hemodynamics were seen in bilateral  subclavian               arteries.   *See table(s) above for measurements and observations.     ASSESSMENT/PLAN: ELMO RIO is a 83 y.o. male presenting with asymptomatic bilateral carotid artery stenosis, right greater than left.  I had a long conversation with Signe regarding the above, most notably that his right carotid artery demonstrates moderate disease, while his left carotid artery demonstrates mild disease.  We discussed that critical stenosis is defined as greater than 80%, and in the asymptomatic setting, this is when elective surgery is usually offered to decrease the risk of stroke.  My plan is to follow him every 6 months to assess for progression.  He notes memory loss with statin therapy, therefore medical optimization for general includes Repatha , aspirin .  He is aware that the risk of stroke with his level of stenosis is low.  We discussed the signs and symptoms of stroke, advised him to call 911 should any of these occur.     Fonda FORBES Rim, MD [facility] [phone]

## 2023-11-02 ENCOUNTER — Ambulatory Visit (INDEPENDENT_AMBULATORY_CARE_PROVIDER_SITE_OTHER): Admitting: Internal Medicine

## 2023-11-02 ENCOUNTER — Encounter: Payer: Self-pay | Admitting: Internal Medicine

## 2023-11-02 ENCOUNTER — Other Ambulatory Visit: Payer: Self-pay | Admitting: Internal Medicine

## 2023-11-02 VITALS — BP 144/80 | HR 57 | Temp 98.6°F | Ht 66.0 in | Wt 148.0 lb

## 2023-11-02 DIAGNOSIS — N320 Bladder-neck obstruction: Secondary | ICD-10-CM

## 2023-11-02 DIAGNOSIS — M6281 Muscle weakness (generalized): Secondary | ICD-10-CM | POA: Diagnosis not present

## 2023-11-02 DIAGNOSIS — E538 Deficiency of other specified B group vitamins: Secondary | ICD-10-CM

## 2023-11-02 DIAGNOSIS — Z23 Encounter for immunization: Secondary | ICD-10-CM | POA: Diagnosis not present

## 2023-11-02 DIAGNOSIS — Z0000 Encounter for general adult medical examination without abnormal findings: Secondary | ICD-10-CM

## 2023-11-02 DIAGNOSIS — I10 Essential (primary) hypertension: Secondary | ICD-10-CM

## 2023-11-02 DIAGNOSIS — I483 Typical atrial flutter: Secondary | ICD-10-CM | POA: Diagnosis not present

## 2023-11-02 DIAGNOSIS — M1711 Unilateral primary osteoarthritis, right knee: Secondary | ICD-10-CM | POA: Diagnosis not present

## 2023-11-02 DIAGNOSIS — I6523 Occlusion and stenosis of bilateral carotid arteries: Secondary | ICD-10-CM | POA: Diagnosis not present

## 2023-11-02 LAB — TSH: TSH: 4.48 u[IU]/mL (ref 0.35–5.50)

## 2023-11-02 LAB — URINALYSIS
Bilirubin Urine: NEGATIVE
Hgb urine dipstick: NEGATIVE
Ketones, ur: NEGATIVE
Leukocytes,Ua: NEGATIVE
Nitrite: NEGATIVE
Specific Gravity, Urine: 1.02 (ref 1.000–1.030)
Total Protein, Urine: NEGATIVE
Urine Glucose: NEGATIVE
Urobilinogen, UA: 0.2 (ref 0.0–1.0)
pH: 5.5 (ref 5.0–8.0)

## 2023-11-02 LAB — CBC WITH DIFFERENTIAL/PLATELET
Basophils Absolute: 0 K/uL (ref 0.0–0.1)
Basophils Relative: 0.5 % (ref 0.0–3.0)
Eosinophils Absolute: 0.2 K/uL (ref 0.0–0.7)
Eosinophils Relative: 3.4 % (ref 0.0–5.0)
HCT: 37.2 % — ABNORMAL LOW (ref 39.0–52.0)
Hemoglobin: 12.5 g/dL — ABNORMAL LOW (ref 13.0–17.0)
Lymphocytes Relative: 28.3 % (ref 12.0–46.0)
Lymphs Abs: 1.5 K/uL (ref 0.7–4.0)
MCHC: 33.6 g/dL (ref 30.0–36.0)
MCV: 92.4 fl (ref 78.0–100.0)
Monocytes Absolute: 0.6 K/uL (ref 0.1–1.0)
Monocytes Relative: 12.1 % — ABNORMAL HIGH (ref 3.0–12.0)
Neutro Abs: 2.9 K/uL (ref 1.4–7.7)
Neutrophils Relative %: 55.7 % (ref 43.0–77.0)
Platelets: 322 K/uL (ref 150.0–400.0)
RBC: 4.03 Mil/uL — ABNORMAL LOW (ref 4.22–5.81)
RDW: 13.2 % (ref 11.5–15.5)
WBC: 5.2 K/uL (ref 4.0–10.5)

## 2023-11-02 LAB — COMPREHENSIVE METABOLIC PANEL WITH GFR
ALT: 24 U/L (ref 0–53)
AST: 25 U/L (ref 0–37)
Albumin: 4.5 g/dL (ref 3.5–5.2)
Alkaline Phosphatase: 85 U/L (ref 39–117)
BUN: 30 mg/dL — ABNORMAL HIGH (ref 6–23)
CO2: 26 meq/L (ref 19–32)
Calcium: 9.7 mg/dL (ref 8.4–10.5)
Chloride: 105 meq/L (ref 96–112)
Creatinine, Ser: 1.07 mg/dL (ref 0.40–1.50)
GFR: 64.54 mL/min (ref >60.00)
Glucose, Bld: 96 mg/dL (ref 70–99)
Potassium: 4.5 meq/L (ref 3.5–5.1)
Sodium: 143 meq/L (ref 135–145)
Total Bilirubin: 0.5 mg/dL (ref 0.2–1.2)
Total Protein: 6.9 g/dL (ref 6.0–8.3)

## 2023-11-02 LAB — LIPID PANEL
Cholesterol: 114 mg/dL (ref 0–200)
HDL: 71.9 mg/dL (ref >39.00)
LDL Cholesterol: 24 mg/dL (ref 0–99)
NonHDL: 42.5
Total CHOL/HDL Ratio: 2
Triglycerides: 91 mg/dL (ref 0.0–149.0)
VLDL: 18.2 mg/dL (ref 0.0–40.0)

## 2023-11-02 LAB — PSA: PSA: 0.71 ng/mL (ref 0.10–4.00)

## 2023-11-02 MED ORDER — COVID-19 MRNA VAC-TRIS(PFIZER) 30 MCG/0.3ML IM SUSY
0.3000 mL | PREFILLED_SYRINGE | Freq: Once | INTRAMUSCULAR | 0 refills | Status: AC
Start: 2023-11-02 — End: 2023-11-02

## 2023-11-02 NOTE — Assessment & Plan Note (Signed)
 On B12

## 2023-11-02 NOTE — Assessment & Plan Note (Addendum)
 [name] Browning is seeing Dr Lanis tomorrow On Repatha , Zetia  Not on ASA due to GI issues

## 2023-11-02 NOTE — Assessment & Plan Note (Signed)
 On NAS diet White coat HTN Nl BP at home

## 2023-11-02 NOTE — Progress Notes (Signed)
 Subjective:  Patient ID: [name] Browning, male    DOB: 1940/08/06  Age: 83 y.o. MRN: [id number]  CC: Annual Exam   HPI [name] Browning presents for carotid stenosis, OA - R knee (R), B12 def  Well exam  Outpatient Medications Prior to Visit  Medication Sig Dispense Refill   Cholecalciferol (VITAMIN D3) 50 MCG (2000 UT) capsule Take 1 capsule (2,000 Units total) by mouth daily.     cyanocobalamin  1000 MCG tablet Take 1,000 mcg by mouth daily.     Evolocumab  (REPATHA  SURECLICK) 140 MG/ML SOAJ INJECT 140MG  SUBCUTANEOUSLY EVERY 2 WEEKS 6 mL 1   ezetimibe  (ZETIA ) 10 MG tablet Take 1 tablet (10 mg total) by mouth daily. 90 tablet 3   Multiple Vitamin tablet Take 1 tablet by mouth daily.     Multiple Vitamins-Minerals (ZINC  PO) Take 1 tablet by mouth daily.     silodosin  (RAPAFLO ) 4 MG CAPS capsule Take 1 capsule (4 mg total) by mouth daily with breakfast. 90 capsule 3   No facility-administered medications prior to visit.    ROS: Review of Systems  Constitutional:  Negative for appetite change, fatigue and unexpected weight change.  HENT:  Negative for congestion, nosebleeds, sneezing, sore throat and trouble swallowing.   Eyes:  Negative for itching and visual disturbance.  Respiratory:  Negative for cough.   Cardiovascular:  Negative for chest pain, palpitations and leg swelling.  Gastrointestinal:  Negative for abdominal distention, blood in stool, diarrhea and nausea.  Genitourinary:  Negative for frequency, hematuria, testicular pain and urgency.  Musculoskeletal:  Positive for arthralgias. Negative for back pain, gait problem, joint swelling and neck pain.  Skin:  Negative for rash.  Neurological:  Negative for dizziness, tremors, speech difficulty and weakness.  Psychiatric/Behavioral:  Negative for agitation, dysphoric mood, sleep disturbance and suicidal ideas. The patient is not nervous/anxious.     Objective:  BP (!) 144/80   Pulse (!) 57   Temp 98.6 F (37 C)  (Oral)   Ht 5' 6 (1.676 m)   Wt 148 lb (67.1 kg)   SpO2 96%   BMI 23.89 kg/m   BP Readings from Last 3 Encounters:  11/02/23 (!) 144/80  08/01/23 (!) 140/70  05/02/23 120/60    Wt Readings from Last 3 Encounters:  11/02/23 148 lb (67.1 kg)  08/01/23 149 lb (67.6 kg)  05/02/23 149 lb (67.6 kg)    Physical Exam Constitutional:      General: He is not in acute distress.    Appearance: He is well-developed.     Comments: NAD  Eyes:     Conjunctiva/sclera: Conjunctivae normal.     Pupils: Pupils are equal, round, and reactive to light.  Neck:     Thyroid : No thyromegaly.     Vascular: No JVD.  Cardiovascular:     Rate and Rhythm: Normal rate and regular rhythm.     Heart sounds: Normal heart sounds. No murmur heard.    No friction rub. No gallop.  Pulmonary:     Effort: Pulmonary effort is normal. No respiratory distress.     Breath sounds: Normal breath sounds. No wheezing or rales.  Chest:     Chest wall: No tenderness.  Abdominal:     General: Bowel sounds are normal. There is no distension.     Palpations: Abdomen is soft. There is no mass.     Tenderness: There is no abdominal tenderness. There is no guarding or rebound.  Musculoskeletal:  General: Tenderness present. Normal range of motion.     Cervical back: Normal range of motion.  Lymphadenopathy:     Cervical: No cervical adenopathy.  Skin:    General: Skin is warm and dry.     Findings: No rash.  Neurological:     Mental Status: He is alert and oriented to person, place, and time.     Cranial Nerves: No cranial nerve deficit.     Motor: No abnormal muscle tone.     Coordination: Coordination normal.     Gait: Gait normal.     Deep Tendon Reflexes: Reflexes are normal and symmetric.  Psychiatric:        Behavior: Behavior normal.        Thought Content: Thought content normal.        Judgment: Judgment normal.   R knee in a brace Rectal - per Dr Selma   Lab Results  Component Value Date    WBC 4.6 04/20/2023   HGB 12.7 (L) 04/20/2023   HCT 37.6 (L) 04/20/2023   PLT 297.0 04/20/2023   GLUCOSE 105 (H) 04/20/2023   CHOL 104 04/20/2023   TRIG 81.0 04/20/2023   HDL 65.70 04/20/2023   LDLDIRECT 152.0 10/12/2018   LDLCALC 22 04/20/2023   ALT 16 04/20/2023   AST 20 04/20/2023   NA 141 04/20/2023   K 4.2 04/20/2023   CL 105 04/20/2023   CREATININE 1.03 04/20/2023   BUN 23 04/20/2023   CO2 28 04/20/2023   TSH 4.51 04/20/2023   PSA 0.78 11/01/2022   INR 0.9 12/10/2020   HGBA1C 6.1 04/20/2023    VAS US  CAROTID Result Date: 09/28/2023 Carotid Arterial Duplex Study Patient Name:  [name] Browning  Date of Exam:   09/28/2023 Medical Rec #: [id number]          Accession #:    [id number] Date of Birth: 1940/07/07           Patient Gender: M Patient Age:   4 years Exam Location:  [address] Procedure:      VAS US  CAROTID Referring Phys: WILBERT TURNER --------------------------------------------------------------------------------  Indications:   Carotid artery disease. Risk Factors:  Hypertension, hyperlipidemia. Other Factors: Telephone encounter 8/8/5: Notes from Dr. Melba at [facility] Sleep                and TMJ solutions reviewed, Dr. Shlomo recommends carotid                dopplers. Performing Technologist: [place] [facility]  Examination Guidelines: A complete evaluation includes B-mode imaging, spectral Doppler, color Doppler, and power Doppler as needed of all accessible portions of each vessel. Bilateral testing is considered an integral part of a complete examination. Limited examinations for reoccurring indications may be performed as noted.  Right Carotid Findings: +----------+--------+--------+--------+------------------+-----------------+           PSV cm/sEDV cm/sStenosisPlaque DescriptionComments          +----------+--------+--------+--------+------------------+-----------------+ CCA Prox  81      12                                                   +----------+--------+--------+--------+------------------+-----------------+ CCA Mid   89      16              heterogenous                        +----------+--------+--------+--------+------------------+-----------------+  CCA Distal85      17                                                  +----------+--------+--------+--------+------------------+-----------------+ ICA Prox  278     62      40-59%  heterogenous      borderline 60-79% +----------+--------+--------+--------+------------------+-----------------+ ICA Mid   126     27                                                  +----------+--------+--------+--------+------------------+-----------------+ ICA Distal93      26                                                  +----------+--------+--------+--------+------------------+-----------------+ ECA       112     7                                                   +----------+--------+--------+--------+------------------+-----------------+ +----------+--------+-------+----------------+-------------------+           PSV cm/sEDV cmsDescribe        Arm Pressure (mmHG) +----------+--------+-------+----------------+-------------------+ [id number]     0      Multiphasic, [id number]                 +----------+--------+-------+----------------+-------------------+ +---------+--------+--+--------+-+---------+ VertebralPSV cm/s60EDV cm/s9Antegrade +---------+--------+--+--------+-+---------+  Left Carotid Findings: +----------+--------+--------+--------+-------------------------+--------+           PSV cm/sEDV cm/sStenosisPlaque Description       Comments +----------+--------+--------+--------+-------------------------+--------+ CCA Prox  88      13                                                +----------+--------+--------+--------+-------------------------+--------+ CCA Mid   81      15                                                 +----------+--------+--------+--------+-------------------------+--------+ CCA Distal82      19              heterogenous and calcific         +----------+--------+--------+--------+-------------------------+--------+ ICA Prox  154     18      1-39%   heterogenous                      +----------+--------+--------+--------+-------------------------+--------+ ICA Mid   138     33                                                +----------+--------+--------+--------+-------------------------+--------+  ICA Distal100     26                                                +----------+--------+--------+--------+-------------------------+--------+ ECA       243     5               heterogenous                      +----------+--------+--------+--------+-------------------------+--------+ +----------+--------+--------+----------------+-------------------+           PSV cm/sEDV cm/sDescribe        Arm Pressure (mmHG) +----------+--------+--------+----------------+-------------------+ [id number]     0       Multiphasic, TWO857                 +----------+--------+--------+----------------+-------------------+ +---------+--------+--+--------+-+---------+ VertebralPSV cm/s21EDV cm/s5Antegrade +---------+--------+--+--------+-+---------+   Summary: Right Carotid: Velocities in the right ICA are consistent with a 40-59%                stenosis. Borderline 60-79%. Left Carotid: Velocities in the left ICA are consistent with a 1-39% stenosis. Vertebrals:  Bilateral vertebral arteries demonstrate antegrade flow. Subclavians: Normal flow hemodynamics were seen in bilateral subclavian              arteries. *See table(s) above for measurements and observations.  Electronically signed by Dorn Lesches MD on 09/28/2023 at 5:01:24 PM.    Final     Assessment & Plan:   Problem List Items Addressed This Visit     Atrial flutter (HCC)   B12 deficiency   On B12      Bladder  neck obstruction   Carotid stenosis, asymptomatic, bilateral   Signe is seeing Dr Lanis tomorrow On [place] , Zetia  Not on ASA due to GI issues      Essential hypertension   On NAS diet White coat HTN Nl BP at home      Well adult exam    We discussed age appropriate health related issues, including available/recomended screening tests and vaccinations. Labs were ordered to be later reviewed . All questions were answered. We discussed one or more of the following - seat belt use, use of sunscreen/sun exposure exercise, fall risk reduction, second hand smoke exposure, firearm use and storage, seat belt use, a need for adhering to healthy diet and exercise. Labs were ordered.  All questions were answered.       Other Visit Diagnoses       Immunization due    -  Primary   Relevant Orders   Flu vaccine HIGH DOSE PF(Fluzone Trivalent) (Completed)         Meds ordered this encounter  Medications   COVID-19 mRNA vaccine, Pfizer, (COMIRNATY) syringe    Sig: Inject 0.3 mLs into the muscle once for 1 dose.    Dispense:  0.3 mL    Refill:  0      Follow-up: Return in about 6 months (around 05/01/2024) for a follow-up visit.  Marolyn Noel, MD

## 2023-11-02 NOTE — Assessment & Plan Note (Signed)

## 2023-11-03 ENCOUNTER — Ambulatory Visit: Attending: Vascular Surgery | Admitting: Vascular Surgery

## 2023-11-03 ENCOUNTER — Encounter: Payer: Self-pay | Admitting: Vascular Surgery

## 2023-11-03 VITALS — BP 152/61 | HR 56 | Temp 98.0°F | Resp 18 | Ht 66.0 in | Wt 150.7 lb

## 2023-11-03 DIAGNOSIS — I6523 Occlusion and stenosis of bilateral carotid arteries: Secondary | ICD-10-CM | POA: Insufficient documentation

## 2023-11-04 ENCOUNTER — Other Ambulatory Visit: Payer: Self-pay

## 2023-11-04 DIAGNOSIS — I6523 Occlusion and stenosis of bilateral carotid arteries: Secondary | ICD-10-CM

## 2023-11-04 DIAGNOSIS — M6281 Muscle weakness (generalized): Secondary | ICD-10-CM | POA: Diagnosis not present

## 2023-11-04 DIAGNOSIS — M1711 Unilateral primary osteoarthritis, right knee: Secondary | ICD-10-CM | POA: Diagnosis not present

## 2023-11-06 DIAGNOSIS — Z23 Encounter for immunization: Secondary | ICD-10-CM | POA: Diagnosis not present

## 2023-11-07 ENCOUNTER — Ambulatory Visit: Payer: Self-pay | Admitting: Internal Medicine

## 2023-11-07 DIAGNOSIS — M1711 Unilateral primary osteoarthritis, right knee: Secondary | ICD-10-CM | POA: Diagnosis not present

## 2023-11-07 DIAGNOSIS — M6281 Muscle weakness (generalized): Secondary | ICD-10-CM | POA: Diagnosis not present

## 2023-11-11 DIAGNOSIS — M6281 Muscle weakness (generalized): Secondary | ICD-10-CM | POA: Diagnosis not present

## 2023-11-11 DIAGNOSIS — M1711 Unilateral primary osteoarthritis, right knee: Secondary | ICD-10-CM | POA: Diagnosis not present

## 2023-11-14 DIAGNOSIS — M1711 Unilateral primary osteoarthritis, right knee: Secondary | ICD-10-CM | POA: Diagnosis not present

## 2023-11-14 DIAGNOSIS — M25561 Pain in right knee: Secondary | ICD-10-CM | POA: Diagnosis not present

## 2023-11-14 DIAGNOSIS — M6281 Muscle weakness (generalized): Secondary | ICD-10-CM | POA: Diagnosis not present

## 2023-11-16 DIAGNOSIS — M1711 Unilateral primary osteoarthritis, right knee: Secondary | ICD-10-CM | POA: Diagnosis not present

## 2023-11-16 DIAGNOSIS — M6281 Muscle weakness (generalized): Secondary | ICD-10-CM | POA: Diagnosis not present

## 2023-11-23 DIAGNOSIS — M6281 Muscle weakness (generalized): Secondary | ICD-10-CM | POA: Diagnosis not present

## 2023-11-23 DIAGNOSIS — M1711 Unilateral primary osteoarthritis, right knee: Secondary | ICD-10-CM | POA: Diagnosis not present

## 2023-11-24 DIAGNOSIS — M25561 Pain in right knee: Secondary | ICD-10-CM | POA: Diagnosis not present

## 2023-11-28 DIAGNOSIS — M1711 Unilateral primary osteoarthritis, right knee: Secondary | ICD-10-CM | POA: Diagnosis not present

## 2023-11-28 DIAGNOSIS — M6281 Muscle weakness (generalized): Secondary | ICD-10-CM | POA: Diagnosis not present

## 2023-11-30 DIAGNOSIS — M6281 Muscle weakness (generalized): Secondary | ICD-10-CM | POA: Diagnosis not present

## 2023-11-30 DIAGNOSIS — M1711 Unilateral primary osteoarthritis, right knee: Secondary | ICD-10-CM | POA: Diagnosis not present

## 2023-12-05 DIAGNOSIS — M1711 Unilateral primary osteoarthritis, right knee: Secondary | ICD-10-CM | POA: Diagnosis not present

## 2023-12-05 DIAGNOSIS — M6281 Muscle weakness (generalized): Secondary | ICD-10-CM | POA: Diagnosis not present

## 2023-12-07 DIAGNOSIS — M1711 Unilateral primary osteoarthritis, right knee: Secondary | ICD-10-CM | POA: Diagnosis not present

## 2023-12-07 DIAGNOSIS — M6281 Muscle weakness (generalized): Secondary | ICD-10-CM | POA: Diagnosis not present

## 2023-12-12 DIAGNOSIS — M1711 Unilateral primary osteoarthritis, right knee: Secondary | ICD-10-CM | POA: Diagnosis not present

## 2023-12-12 DIAGNOSIS — M6281 Muscle weakness (generalized): Secondary | ICD-10-CM | POA: Diagnosis not present

## 2023-12-13 DIAGNOSIS — M25561 Pain in right knee: Secondary | ICD-10-CM | POA: Diagnosis not present

## 2023-12-13 DIAGNOSIS — S83231S Complex tear of medial meniscus, current injury, right knee, sequela: Secondary | ICD-10-CM | POA: Diagnosis not present

## 2023-12-14 DIAGNOSIS — L570 Actinic keratosis: Secondary | ICD-10-CM | POA: Diagnosis not present

## 2023-12-14 DIAGNOSIS — L821 Other seborrheic keratosis: Secondary | ICD-10-CM | POA: Diagnosis not present

## 2023-12-14 DIAGNOSIS — L812 Freckles: Secondary | ICD-10-CM | POA: Diagnosis not present

## 2023-12-14 DIAGNOSIS — D1801 Hemangioma of skin and subcutaneous tissue: Secondary | ICD-10-CM | POA: Diagnosis not present

## 2023-12-14 DIAGNOSIS — Z85828 Personal history of other malignant neoplasm of skin: Secondary | ICD-10-CM | POA: Diagnosis not present

## 2023-12-16 DIAGNOSIS — M6281 Muscle weakness (generalized): Secondary | ICD-10-CM | POA: Diagnosis not present

## 2023-12-16 DIAGNOSIS — M1711 Unilateral primary osteoarthritis, right knee: Secondary | ICD-10-CM | POA: Diagnosis not present

## 2023-12-19 DIAGNOSIS — M6281 Muscle weakness (generalized): Secondary | ICD-10-CM | POA: Diagnosis not present

## 2023-12-19 DIAGNOSIS — M1711 Unilateral primary osteoarthritis, right knee: Secondary | ICD-10-CM | POA: Diagnosis not present

## 2023-12-23 DIAGNOSIS — M1711 Unilateral primary osteoarthritis, right knee: Secondary | ICD-10-CM | POA: Diagnosis not present

## 2023-12-23 DIAGNOSIS — M6281 Muscle weakness (generalized): Secondary | ICD-10-CM | POA: Diagnosis not present

## 2023-12-26 DIAGNOSIS — M6281 Muscle weakness (generalized): Secondary | ICD-10-CM | POA: Diagnosis not present

## 2023-12-26 DIAGNOSIS — M1711 Unilateral primary osteoarthritis, right knee: Secondary | ICD-10-CM | POA: Diagnosis not present

## 2023-12-28 ENCOUNTER — Other Ambulatory Visit: Payer: Self-pay | Admitting: Internal Medicine

## 2023-12-28 DIAGNOSIS — M1711 Unilateral primary osteoarthritis, right knee: Secondary | ICD-10-CM | POA: Diagnosis not present

## 2023-12-28 DIAGNOSIS — E7800 Pure hypercholesterolemia, unspecified: Secondary | ICD-10-CM

## 2023-12-28 DIAGNOSIS — M6281 Muscle weakness (generalized): Secondary | ICD-10-CM | POA: Diagnosis not present

## 2024-01-02 DIAGNOSIS — M6281 Muscle weakness (generalized): Secondary | ICD-10-CM | POA: Diagnosis not present

## 2024-01-02 DIAGNOSIS — M1711 Unilateral primary osteoarthritis, right knee: Secondary | ICD-10-CM | POA: Diagnosis not present

## 2024-01-04 DIAGNOSIS — M6281 Muscle weakness (generalized): Secondary | ICD-10-CM | POA: Diagnosis not present

## 2024-01-04 DIAGNOSIS — M1711 Unilateral primary osteoarthritis, right knee: Secondary | ICD-10-CM | POA: Diagnosis not present

## 2024-01-09 DIAGNOSIS — M1711 Unilateral primary osteoarthritis, right knee: Secondary | ICD-10-CM | POA: Diagnosis not present

## 2024-01-09 DIAGNOSIS — M6281 Muscle weakness (generalized): Secondary | ICD-10-CM | POA: Diagnosis not present

## 2024-01-16 DIAGNOSIS — M6281 Muscle weakness (generalized): Secondary | ICD-10-CM | POA: Diagnosis not present

## 2024-01-16 DIAGNOSIS — M1711 Unilateral primary osteoarthritis, right knee: Secondary | ICD-10-CM | POA: Diagnosis not present

## 2024-01-25 DIAGNOSIS — R350 Frequency of micturition: Secondary | ICD-10-CM | POA: Diagnosis not present

## 2024-01-25 DIAGNOSIS — R3912 Poor urinary stream: Secondary | ICD-10-CM | POA: Diagnosis not present

## 2024-01-25 DIAGNOSIS — R3914 Feeling of incomplete bladder emptying: Secondary | ICD-10-CM | POA: Diagnosis not present

## 2024-01-25 DIAGNOSIS — N401 Enlarged prostate with lower urinary tract symptoms: Secondary | ICD-10-CM | POA: Diagnosis not present

## 2024-02-17 ENCOUNTER — Ambulatory Visit

## 2024-05-02 ENCOUNTER — Ambulatory Visit: Admitting: Internal Medicine

## 2024-05-03 ENCOUNTER — Encounter (HOSPITAL_COMMUNITY)

## 2024-05-03 ENCOUNTER — Ambulatory Visit

## 2024-05-23 ENCOUNTER — Ambulatory Visit: Admitting: Cardiology
